# Patient Record
Sex: Female | Born: 1935 | Race: White | Hispanic: No | Smoking: Former smoker
Health system: Southern US, Community
[De-identification: ages and names within clinical notes are randomized; demographics above are authoritative.]

## PROBLEM LIST (undated history)

## (undated) DIAGNOSIS — I10 Essential (primary) hypertension: Secondary | ICD-10-CM

## (undated) DIAGNOSIS — I739 Peripheral vascular disease, unspecified: Secondary | ICD-10-CM

## (undated) DIAGNOSIS — I639 Cerebral infarction, unspecified: Secondary | ICD-10-CM

## (undated) DIAGNOSIS — N189 Chronic kidney disease, unspecified: Secondary | ICD-10-CM

## (undated) DIAGNOSIS — D509 Iron deficiency anemia, unspecified: Principal | ICD-10-CM

## (undated) DIAGNOSIS — D649 Anemia, unspecified: Secondary | ICD-10-CM

## (undated) DIAGNOSIS — L089 Local infection of the skin and subcutaneous tissue, unspecified: Secondary | ICD-10-CM

## (undated) DIAGNOSIS — L0390 Cellulitis, unspecified: Secondary | ICD-10-CM

## (undated) DIAGNOSIS — F32A Depression, unspecified: Secondary | ICD-10-CM

## (undated) DIAGNOSIS — IMO0001 Reserved for inherently not codable concepts without codable children: Secondary | ICD-10-CM

## (undated) DIAGNOSIS — I2510 Atherosclerotic heart disease of native coronary artery without angina pectoris: Secondary | ICD-10-CM

## (undated) DIAGNOSIS — K649 Unspecified hemorrhoids: Secondary | ICD-10-CM

## (undated) DIAGNOSIS — C801 Malignant (primary) neoplasm, unspecified: Secondary | ICD-10-CM

## (undated) DIAGNOSIS — R040 Epistaxis: Secondary | ICD-10-CM

## (undated) DIAGNOSIS — I452 Bifascicular block: Secondary | ICD-10-CM

## (undated) DIAGNOSIS — E11621 Type 2 diabetes mellitus with foot ulcer: Secondary | ICD-10-CM

## (undated) DIAGNOSIS — R600 Localized edema: Secondary | ICD-10-CM

## (undated) DIAGNOSIS — T148XXA Other injury of unspecified body region, initial encounter: Secondary | ICD-10-CM

## (undated) DIAGNOSIS — L97509 Non-pressure chronic ulcer of other part of unspecified foot with unspecified severity: Secondary | ICD-10-CM

## (undated) DIAGNOSIS — E039 Hypothyroidism, unspecified: Secondary | ICD-10-CM

## (undated) DIAGNOSIS — F329 Major depressive disorder, single episode, unspecified: Secondary | ICD-10-CM

## (undated) HISTORY — DX: Anemia, unspecified: D64.9

## (undated) HISTORY — PX: TONSILECTOMY, ADENOIDECTOMY, BILATERAL MYRINGOTOMY AND TUBES: SHX2538

## (undated) HISTORY — DX: Iron deficiency anemia, unspecified: D50.9

## (undated) HISTORY — DX: Essential (primary) hypertension: I10

## (undated) HISTORY — DX: Cerebral infarction, unspecified: I63.9

## (undated) HISTORY — DX: Cellulitis, unspecified: L03.90

## (undated) HISTORY — DX: Peripheral vascular disease, unspecified: I73.9

## (undated) HISTORY — PX: APPENDECTOMY: SHX54

## (undated) HISTORY — PX: CHOLECYSTECTOMY: SHX55

## (undated) HISTORY — DX: Malignant (primary) neoplasm, unspecified: C80.1

## (undated) HISTORY — DX: Atherosclerotic heart disease of native coronary artery without angina pectoris: I25.10

## (undated) HISTORY — PX: COLON SURGERY: SHX602

---

## 1998-11-25 ENCOUNTER — Other Ambulatory Visit: Admission: RE | Admit: 1998-11-25 | Discharge: 1998-11-25 | Payer: Self-pay | Admitting: Obstetrics and Gynecology

## 1999-10-27 ENCOUNTER — Encounter: Payer: Self-pay | Admitting: Rheumatology

## 1999-10-27 ENCOUNTER — Encounter: Admission: RE | Admit: 1999-10-27 | Discharge: 1999-10-27 | Payer: Self-pay | Admitting: *Deleted

## 1999-12-18 ENCOUNTER — Other Ambulatory Visit: Admission: RE | Admit: 1999-12-18 | Discharge: 1999-12-18 | Payer: Self-pay | Admitting: Obstetrics and Gynecology

## 2006-04-19 ENCOUNTER — Ambulatory Visit: Payer: Self-pay | Admitting: Hematology and Oncology

## 2006-04-23 LAB — CBC WITH DIFFERENTIAL/PLATELET
Basophils Absolute: 0 10*3/uL (ref 0.0–0.1)
EOS%: 0.1 % (ref 0.0–7.0)
HCT: 27.9 % — ABNORMAL LOW (ref 34.8–46.6)
HGB: 9.7 g/dL — ABNORMAL LOW (ref 11.6–15.9)
MONO#: 0.8 10*3/uL (ref 0.1–0.9)
NEUT#: 9.3 10*3/uL — ABNORMAL HIGH (ref 1.5–6.5)
NEUT%: 81.1 % — ABNORMAL HIGH (ref 39.6–76.8)
RDW: 14.2 % (ref 11.3–14.5)
WBC: 11.5 10*3/uL — ABNORMAL HIGH (ref 3.9–10.0)
lymph#: 1.3 10*3/uL (ref 0.9–3.3)

## 2006-04-23 LAB — COMPREHENSIVE METABOLIC PANEL
ALT: 14 U/L (ref 0–35)
AST: 9 U/L (ref 0–37)
Albumin: 3.4 g/dL — ABNORMAL LOW (ref 3.5–5.2)
BUN: 42 mg/dL — ABNORMAL HIGH (ref 6–23)
CO2: 22 mEq/L (ref 19–32)
Calcium: 9.1 mg/dL (ref 8.4–10.5)
Chloride: 105 mEq/L (ref 96–112)
Creatinine, Ser: 1.12 mg/dL (ref 0.40–1.20)
Potassium: 4.8 mEq/L (ref 3.5–5.3)

## 2006-04-23 LAB — CEA: CEA: 2.6 ng/mL (ref 0.0–5.0)

## 2006-07-03 ENCOUNTER — Ambulatory Visit: Payer: Self-pay | Admitting: Hematology and Oncology

## 2006-07-08 LAB — CBC WITH DIFFERENTIAL/PLATELET
BASO%: 0.1 % (ref 0.0–2.0)
EOS%: 2.3 % (ref 0.0–7.0)
HGB: 11 g/dL — ABNORMAL LOW (ref 11.6–15.9)
MCH: 30.3 pg (ref 26.0–34.0)
MCV: 86.9 fL (ref 81.0–101.0)
MONO%: 3.9 % (ref 0.0–13.0)
NEUT#: 10.7 10*3/uL — ABNORMAL HIGH (ref 1.5–6.5)
RBC: 3.64 10*6/uL — ABNORMAL LOW (ref 3.70–5.32)
RDW: 13.8 % (ref 11.3–14.5)
lymph#: 1.1 10*3/uL (ref 0.9–3.3)

## 2006-07-08 LAB — COMPREHENSIVE METABOLIC PANEL
ALT: 21 U/L (ref 0–35)
AST: 17 U/L (ref 0–37)
Albumin: 3.8 g/dL (ref 3.5–5.2)
Alkaline Phosphatase: 80 U/L (ref 39–117)
Calcium: 8.7 mg/dL (ref 8.4–10.5)
Chloride: 106 mEq/L (ref 96–112)
Potassium: 4.6 mEq/L (ref 3.5–5.3)
Sodium: 141 mEq/L (ref 135–145)
Total Protein: 6.2 g/dL (ref 6.0–8.3)

## 2006-08-01 LAB — CBC WITH DIFFERENTIAL/PLATELET
Basophils Absolute: 0 10*3/uL (ref 0.0–0.1)
Eosinophils Absolute: 0.2 10*3/uL (ref 0.0–0.5)
HGB: 10.4 g/dL — ABNORMAL LOW (ref 11.6–15.9)
MONO#: 0.5 10*3/uL (ref 0.1–0.9)
NEUT#: 6.1 10*3/uL (ref 1.5–6.5)
RBC: 3.51 10*6/uL — ABNORMAL LOW (ref 3.70–5.32)
RDW: 15.6 % — ABNORMAL HIGH (ref 11.3–14.5)
WBC: 8.5 10*3/uL (ref 3.9–10.0)
lymph#: 1.5 10*3/uL (ref 0.9–3.3)

## 2006-08-01 LAB — COMPREHENSIVE METABOLIC PANEL
Albumin: 3.7 g/dL (ref 3.5–5.2)
Alkaline Phosphatase: 87 U/L (ref 39–117)
BUN: 30 mg/dL — ABNORMAL HIGH (ref 6–23)
CO2: 24 mEq/L (ref 19–32)
Calcium: 9 mg/dL (ref 8.4–10.5)
Chloride: 103 mEq/L (ref 96–112)
Glucose, Bld: 127 mg/dL — ABNORMAL HIGH (ref 70–99)
Potassium: 4.7 mEq/L (ref 3.5–5.3)
Sodium: 139 mEq/L (ref 135–145)
Total Protein: 6.1 g/dL (ref 6.0–8.3)

## 2006-08-01 LAB — FERRITIN: Ferritin: 28 ng/mL (ref 10–291)

## 2006-08-14 ENCOUNTER — Ambulatory Visit: Payer: Self-pay | Admitting: Hematology and Oncology

## 2006-08-16 LAB — CBC WITH DIFFERENTIAL/PLATELET
Basophils Absolute: 0.1 10*3/uL (ref 0.0–0.1)
EOS%: 4.1 % (ref 0.0–7.0)
Eosinophils Absolute: 0.4 10*3/uL (ref 0.0–0.5)
MCHC: 34.6 g/dL (ref 32.0–36.0)
MONO#: 0.5 10*3/uL (ref 0.1–0.9)
NEUT%: 67.5 % (ref 39.6–76.8)
Platelets: 277 10*3/uL (ref 145–400)
RBC: 3.51 10*6/uL — ABNORMAL LOW (ref 3.70–5.32)
RDW: 16.6 % — ABNORMAL HIGH (ref 11.3–14.5)
WBC: 8.9 10*3/uL (ref 3.9–10.0)

## 2006-08-16 LAB — COMPREHENSIVE METABOLIC PANEL
AST: 19 U/L (ref 0–37)
Albumin: 3.8 g/dL (ref 3.5–5.2)
BUN: 32 mg/dL — ABNORMAL HIGH (ref 6–23)
Calcium: 9.2 mg/dL (ref 8.4–10.5)
Chloride: 105 mEq/L (ref 96–112)
Creatinine, Ser: 1.07 mg/dL (ref 0.40–1.20)
Glucose, Bld: 166 mg/dL — ABNORMAL HIGH (ref 70–99)

## 2006-08-16 LAB — FERRITIN: Ferritin: 51 ng/mL (ref 10–291)

## 2006-09-12 LAB — COMPREHENSIVE METABOLIC PANEL
Albumin: 3.6 g/dL (ref 3.5–5.2)
Alkaline Phosphatase: 98 U/L (ref 39–117)
CO2: 23 mEq/L (ref 19–32)
Glucose, Bld: 203 mg/dL — ABNORMAL HIGH (ref 70–99)
Potassium: 4.8 mEq/L (ref 3.5–5.3)
Sodium: 138 mEq/L (ref 135–145)
Total Protein: 6.1 g/dL (ref 6.0–8.3)

## 2006-09-12 LAB — CBC WITH DIFFERENTIAL/PLATELET
Eosinophils Absolute: 0.3 10*3/uL (ref 0.0–0.5)
MONO#: 0.5 10*3/uL (ref 0.1–0.9)
MONO%: 6.3 % (ref 0.0–13.0)
NEUT#: 5.9 10*3/uL (ref 1.5–6.5)
RBC: 3.14 10*6/uL — ABNORMAL LOW (ref 3.70–5.32)
RDW: 20.7 % — ABNORMAL HIGH (ref 11.3–14.5)
WBC: 8.6 10*3/uL (ref 3.9–10.0)

## 2006-10-02 ENCOUNTER — Ambulatory Visit (HOSPITAL_COMMUNITY): Admission: RE | Admit: 2006-10-02 | Discharge: 2006-10-02 | Payer: Self-pay | Admitting: Hematology & Oncology

## 2006-10-17 ENCOUNTER — Ambulatory Visit: Payer: Self-pay | Admitting: Hematology and Oncology

## 2006-10-17 LAB — COMPREHENSIVE METABOLIC PANEL
ALT: 14 U/L (ref 0–35)
AST: 13 U/L (ref 0–37)
Albumin: 3.8 g/dL (ref 3.5–5.2)
Alkaline Phosphatase: 101 U/L (ref 39–117)
Potassium: 5.3 mEq/L (ref 3.5–5.3)
Sodium: 138 mEq/L (ref 135–145)
Total Protein: 6.2 g/dL (ref 6.0–8.3)

## 2006-10-17 LAB — CBC WITH DIFFERENTIAL/PLATELET
BASO%: 0.3 % (ref 0.0–2.0)
EOS%: 4 % (ref 0.0–7.0)
Eosinophils Absolute: 0.3 10*3/uL (ref 0.0–0.5)
MCHC: 35 g/dL (ref 32.0–36.0)
MCV: 94.7 fL (ref 81.0–101.0)
MONO%: 6.9 % (ref 0.0–13.0)
NEUT#: 5.3 10*3/uL (ref 1.5–6.5)
RBC: 3.09 10*6/uL — ABNORMAL LOW (ref 3.70–5.32)
RDW: 18.9 % — ABNORMAL HIGH (ref 11.3–14.5)

## 2006-11-07 LAB — CBC WITH DIFFERENTIAL/PLATELET
BASO%: 0.6 % (ref 0.0–2.0)
EOS%: 3.3 % (ref 0.0–7.0)
MCH: 33.3 pg (ref 26.0–34.0)
MCHC: 34.4 g/dL (ref 32.0–36.0)
MONO#: 0.5 10*3/uL (ref 0.1–0.9)
RBC: 3.11 10*6/uL — ABNORMAL LOW (ref 3.70–5.32)
RDW: 17.4 % — ABNORMAL HIGH (ref 11.3–14.5)
WBC: 8 10*3/uL (ref 3.9–10.0)
lymph#: 1.7 10*3/uL (ref 0.9–3.3)

## 2006-11-07 LAB — COMPREHENSIVE METABOLIC PANEL
ALT: 14 U/L (ref 0–35)
AST: 12 U/L (ref 0–37)
CO2: 21 mEq/L (ref 19–32)
Calcium: 9.1 mg/dL (ref 8.4–10.5)
Chloride: 105 mEq/L (ref 96–112)
Creatinine, Ser: 1.18 mg/dL (ref 0.40–1.20)
Sodium: 140 mEq/L (ref 135–145)
Total Protein: 6.1 g/dL (ref 6.0–8.3)

## 2006-12-03 ENCOUNTER — Ambulatory Visit: Payer: Self-pay | Admitting: Hematology and Oncology

## 2006-12-05 LAB — CBC WITH DIFFERENTIAL/PLATELET
BASO%: 0.5 % (ref 0.0–2.0)
EOS%: 2.8 % (ref 0.0–7.0)
HCT: 29.2 % — ABNORMAL LOW (ref 34.8–46.6)
LYMPH%: 17.3 % (ref 14.0–48.0)
MCH: 34.4 pg — ABNORMAL HIGH (ref 26.0–34.0)
MCHC: 34.9 g/dL (ref 32.0–36.0)
MONO#: 0.6 10*3/uL (ref 0.1–0.9)
NEUT%: 73 % (ref 39.6–76.8)
Platelets: 296 10*3/uL (ref 145–400)
RBC: 2.97 10*6/uL — ABNORMAL LOW (ref 3.70–5.32)
WBC: 9.4 10*3/uL (ref 3.9–10.0)
lymph#: 1.6 10*3/uL (ref 0.9–3.3)

## 2006-12-05 LAB — COMPREHENSIVE METABOLIC PANEL
ALT: 13 U/L (ref 0–35)
AST: 12 U/L (ref 0–37)
Alkaline Phosphatase: 102 U/L (ref 39–117)
CO2: 23 mEq/L (ref 19–32)
Creatinine, Ser: 1.29 mg/dL — ABNORMAL HIGH (ref 0.40–1.20)
Sodium: 138 mEq/L (ref 135–145)
Total Bilirubin: 0.3 mg/dL (ref 0.3–1.2)
Total Protein: 6.4 g/dL (ref 6.0–8.3)

## 2006-12-26 LAB — CBC WITH DIFFERENTIAL/PLATELET
BASO%: 0.3 % (ref 0.0–2.0)
EOS%: 3.1 % (ref 0.0–7.0)
HCT: 29.6 % — ABNORMAL LOW (ref 34.8–46.6)
LYMPH%: 16.4 % (ref 14.0–48.0)
MCH: 33.8 pg (ref 26.0–34.0)
MCHC: 34.4 g/dL (ref 32.0–36.0)
MCV: 98.4 fL (ref 81.0–101.0)
MONO#: 0.7 10*3/uL (ref 0.1–0.9)
MONO%: 8.1 % (ref 0.0–13.0)
NEUT%: 72.1 % (ref 39.6–76.8)
Platelets: 280 10*3/uL (ref 145–400)
RBC: 3.01 10*6/uL — ABNORMAL LOW (ref 3.70–5.32)

## 2007-01-09 ENCOUNTER — Ambulatory Visit (HOSPITAL_COMMUNITY): Admission: RE | Admit: 2007-01-09 | Discharge: 2007-01-09 | Payer: Self-pay | Admitting: Hematology and Oncology

## 2007-01-09 LAB — CBC WITH DIFFERENTIAL/PLATELET
Basophils Absolute: 0 10*3/uL (ref 0.0–0.1)
Eosinophils Absolute: 0.3 10*3/uL (ref 0.0–0.5)
HCT: 29.4 % — ABNORMAL LOW (ref 34.8–46.6)
HGB: 10.2 g/dL — ABNORMAL LOW (ref 11.6–15.9)
MCH: 33.5 pg (ref 26.0–34.0)
NEUT#: 6 10*3/uL (ref 1.5–6.5)
NEUT%: 71.5 % (ref 39.6–76.8)
RDW: 15.3 % — ABNORMAL HIGH (ref 11.3–14.5)
lymph#: 1.5 10*3/uL (ref 0.9–3.3)

## 2007-01-09 LAB — COMPREHENSIVE METABOLIC PANEL
Albumin: 3.7 g/dL (ref 3.5–5.2)
BUN: 32 mg/dL — ABNORMAL HIGH (ref 6–23)
CO2: 26 mEq/L (ref 19–32)
Calcium: 8.7 mg/dL (ref 8.4–10.5)
Chloride: 99 mEq/L (ref 96–112)
Creatinine, Ser: 1.17 mg/dL (ref 0.40–1.20)
Glucose, Bld: 81 mg/dL (ref 70–99)
Potassium: 4.8 mEq/L (ref 3.5–5.3)

## 2007-01-09 LAB — CEA: CEA: 2.1 ng/mL (ref 0.0–5.0)

## 2007-01-09 LAB — LACTATE DEHYDROGENASE: LDH: 145 U/L (ref 94–250)

## 2007-01-15 ENCOUNTER — Ambulatory Visit: Payer: Self-pay | Admitting: Hematology and Oncology

## 2007-01-17 LAB — VITAMIN B12: Vitamin B-12: 538 pg/mL (ref 211–911)

## 2007-01-17 LAB — FERRITIN: Ferritin: 41 ng/mL (ref 10–291)

## 2007-01-17 LAB — IRON AND TIBC
Iron: 47 ug/dL (ref 42–145)
UIBC: 258 ug/dL

## 2007-04-25 ENCOUNTER — Ambulatory Visit: Payer: Self-pay | Admitting: Hematology and Oncology

## 2007-04-29 LAB — CBC WITH DIFFERENTIAL/PLATELET
EOS%: 3.7 % (ref 0.0–7.0)
Eosinophils Absolute: 0.3 10*3/uL (ref 0.0–0.5)
HGB: 11.3 g/dL — ABNORMAL LOW (ref 11.6–15.9)
MCH: 27.9 pg (ref 26.0–34.0)
MCV: 86.7 fL (ref 81.0–101.0)
MONO%: 4.8 % (ref 0.0–13.0)
NEUT#: 6.7 10*3/uL — ABNORMAL HIGH (ref 1.5–6.5)
RBC: 4.04 10*6/uL (ref 3.70–5.32)
RDW: 13.2 % (ref 11.3–14.5)
lymph#: 1.5 10*3/uL (ref 0.9–3.3)

## 2007-04-30 LAB — COMPREHENSIVE METABOLIC PANEL
AST: 9 U/L (ref 0–37)
Albumin: 4 g/dL (ref 3.5–5.2)
Alkaline Phosphatase: 79 U/L (ref 39–117)
Calcium: 9.6 mg/dL (ref 8.4–10.5)
Chloride: 101 mEq/L (ref 96–112)
Potassium: 5 mEq/L (ref 3.5–5.3)
Sodium: 139 mEq/L (ref 135–145)
Total Protein: 6.8 g/dL (ref 6.0–8.3)

## 2007-04-30 LAB — CEA: CEA: 1.4 ng/mL (ref 0.0–5.0)

## 2007-07-25 ENCOUNTER — Ambulatory Visit: Payer: Self-pay | Admitting: Hematology and Oncology

## 2007-07-31 ENCOUNTER — Ambulatory Visit (HOSPITAL_COMMUNITY): Admission: RE | Admit: 2007-07-31 | Discharge: 2007-07-31 | Payer: Self-pay | Admitting: Hematology and Oncology

## 2007-12-02 ENCOUNTER — Ambulatory Visit: Payer: Self-pay | Admitting: Hematology and Oncology

## 2007-12-04 LAB — CBC WITH DIFFERENTIAL/PLATELET
Basophils Absolute: 0 10*3/uL (ref 0.0–0.1)
EOS%: 2.9 % (ref 0.0–7.0)
Eosinophils Absolute: 0.3 10*3/uL (ref 0.0–0.5)
HCT: 32.2 % — ABNORMAL LOW (ref 34.8–46.6)
HGB: 10.7 g/dL — ABNORMAL LOW (ref 11.6–15.9)
MONO#: 0.5 10*3/uL (ref 0.1–0.9)
NEUT#: 7 10*3/uL — ABNORMAL HIGH (ref 1.5–6.5)
NEUT%: 76.4 % (ref 39.6–76.8)
RDW: 13.5 % (ref 11.3–14.5)
WBC: 9.2 10*3/uL (ref 3.9–10.0)
lymph#: 1.4 10*3/uL (ref 0.9–3.3)

## 2007-12-04 LAB — COMPREHENSIVE METABOLIC PANEL
AST: 11 U/L (ref 0–37)
Albumin: 3.7 g/dL (ref 3.5–5.2)
BUN: 22 mg/dL (ref 6–23)
CO2: 25 mEq/L (ref 19–32)
Calcium: 8.7 mg/dL (ref 8.4–10.5)
Chloride: 103 mEq/L (ref 96–112)
Creatinine, Ser: 1.18 mg/dL (ref 0.40–1.20)
Potassium: 5 mEq/L (ref 3.5–5.3)

## 2007-12-04 LAB — CEA: CEA: 2 ng/mL (ref 0.0–5.0)

## 2008-03-30 ENCOUNTER — Ambulatory Visit: Payer: Self-pay | Admitting: Hematology and Oncology

## 2008-04-06 ENCOUNTER — Ambulatory Visit (HOSPITAL_COMMUNITY): Admission: RE | Admit: 2008-04-06 | Discharge: 2008-04-06 | Payer: Self-pay | Admitting: Hematology and Oncology

## 2008-08-17 ENCOUNTER — Ambulatory Visit: Payer: Self-pay | Admitting: Hematology and Oncology

## 2008-08-19 LAB — CBC WITH DIFFERENTIAL/PLATELET
BASO%: 0.4 % (ref 0.0–2.0)
Basophils Absolute: 0 10*3/uL (ref 0.0–0.1)
HCT: 31.7 % — ABNORMAL LOW (ref 34.8–46.6)
MCV: 86.1 fL (ref 79.5–101.0)
MONO%: 5.6 % (ref 0.0–14.0)
NEUT#: 7.2 10*3/uL — ABNORMAL HIGH (ref 1.5–6.5)
RBC: 3.69 10*6/uL — ABNORMAL LOW (ref 3.70–5.45)
WBC: 9.7 10*3/uL (ref 3.9–10.3)

## 2008-08-19 LAB — COMPREHENSIVE METABOLIC PANEL
ALT: 9 U/L (ref 0–35)
CO2: 26 mEq/L (ref 19–32)
Calcium: 9.6 mg/dL (ref 8.4–10.5)
Chloride: 102 mEq/L (ref 96–112)
Glucose, Bld: 184 mg/dL — ABNORMAL HIGH (ref 70–99)
Potassium: 5.1 mEq/L (ref 3.5–5.3)
Sodium: 140 mEq/L (ref 135–145)
Total Bilirubin: 0.3 mg/dL (ref 0.3–1.2)

## 2008-08-19 LAB — CEA: CEA: 2.2 ng/mL (ref 0.0–5.0)

## 2008-12-24 ENCOUNTER — Ambulatory Visit: Payer: Self-pay | Admitting: Hematology and Oncology

## 2008-12-28 ENCOUNTER — Ambulatory Visit: Payer: Self-pay | Admitting: Hematology & Oncology

## 2008-12-28 ENCOUNTER — Ambulatory Visit (HOSPITAL_COMMUNITY): Admission: RE | Admit: 2008-12-28 | Discharge: 2008-12-28 | Payer: Self-pay | Admitting: Hematology and Oncology

## 2008-12-28 LAB — CBC WITH DIFFERENTIAL/PLATELET
Basophils Absolute: 0.1 10*3/uL (ref 0.0–0.1)
EOS%: 4 % (ref 0.0–7.0)
LYMPH%: 22.1 % (ref 14.0–49.7)
MCH: 29 pg (ref 25.1–34.0)
MONO%: 5.9 % (ref 0.0–14.0)
NEUT#: 6.2 10*3/uL (ref 1.5–6.5)
NEUT%: 67.4 % (ref 38.4–76.8)
RDW: 14 % (ref 11.2–14.5)

## 2008-12-28 LAB — COMPREHENSIVE METABOLIC PANEL
Alkaline Phosphatase: 66 U/L (ref 39–117)
Calcium: 9.2 mg/dL (ref 8.4–10.5)
Chloride: 102 mEq/L (ref 96–112)
Glucose, Bld: 147 mg/dL — ABNORMAL HIGH (ref 70–99)
Potassium: 4.7 mEq/L (ref 3.5–5.3)
Sodium: 138 mEq/L (ref 135–145)
Total Bilirubin: 0.5 mg/dL (ref 0.3–1.2)

## 2008-12-28 LAB — CEA: CEA: 2.2 ng/mL (ref 0.0–5.0)

## 2009-06-24 ENCOUNTER — Ambulatory Visit: Payer: Self-pay | Admitting: Hematology and Oncology

## 2009-06-28 LAB — COMPREHENSIVE METABOLIC PANEL
ALT: 17 U/L (ref 0–35)
Alkaline Phosphatase: 65 U/L (ref 39–117)
BUN: 39 mg/dL — ABNORMAL HIGH (ref 6–23)
CO2: 29 mEq/L (ref 19–32)
Glucose, Bld: 147 mg/dL — ABNORMAL HIGH (ref 70–99)
Total Bilirubin: 0.5 mg/dL (ref 0.3–1.2)
Total Protein: 6.6 g/dL (ref 6.0–8.3)

## 2009-06-28 LAB — CBC WITH DIFFERENTIAL/PLATELET
EOS%: 3.5 % (ref 0.0–7.0)
Eosinophils Absolute: 0.3 10*3/uL (ref 0.0–0.5)
HGB: 10.7 g/dL — ABNORMAL LOW (ref 11.6–15.9)
MCHC: 32.4 g/dL (ref 31.5–36.0)
MONO%: 5.5 % (ref 0.0–14.0)
NEUT%: 70.7 % (ref 38.4–76.8)
Platelets: 272 10*3/uL (ref 145–400)
RBC: 3.73 10*6/uL (ref 3.70–5.45)

## 2009-06-29 LAB — CEA: CEA: 2.5 ng/mL (ref 0.0–5.0)

## 2009-12-07 ENCOUNTER — Ambulatory Visit: Payer: Self-pay | Admitting: Vascular Surgery

## 2009-12-09 ENCOUNTER — Ambulatory Visit (HOSPITAL_COMMUNITY): Admission: RE | Admit: 2009-12-09 | Discharge: 2009-12-09 | Payer: Self-pay | Admitting: Cardiology

## 2009-12-21 ENCOUNTER — Ambulatory Visit (HOSPITAL_BASED_OUTPATIENT_CLINIC_OR_DEPARTMENT_OTHER): Payer: Medicare Other | Admitting: Hematology & Oncology

## 2010-03-31 ENCOUNTER — Other Ambulatory Visit: Payer: Self-pay | Admitting: Hematology & Oncology

## 2010-03-31 DIAGNOSIS — C189 Malignant neoplasm of colon, unspecified: Secondary | ICD-10-CM

## 2010-04-01 ENCOUNTER — Encounter: Payer: Self-pay | Admitting: Hematology & Oncology

## 2010-06-15 ENCOUNTER — Ambulatory Visit (HOSPITAL_BASED_OUTPATIENT_CLINIC_OR_DEPARTMENT_OTHER)
Admission: RE | Admit: 2010-06-15 | Discharge: 2010-06-15 | Disposition: A | Discharge status: Home / Self Care | Payer: Medicare Other | Source: Ambulatory Visit | Attending: Hematology & Oncology | Admitting: Hematology & Oncology

## 2010-06-15 ENCOUNTER — Encounter (HOSPITAL_BASED_OUTPATIENT_CLINIC_OR_DEPARTMENT_OTHER): Payer: Medicare Other | Admitting: Hematology & Oncology

## 2010-06-15 ENCOUNTER — Other Ambulatory Visit (HOSPITAL_BASED_OUTPATIENT_CLINIC_OR_DEPARTMENT_OTHER): Payer: Self-pay

## 2010-06-15 DIAGNOSIS — K5730 Diverticulosis of large intestine without perforation or abscess without bleeding: Secondary | ICD-10-CM | POA: Insufficient documentation

## 2010-06-15 DIAGNOSIS — C189 Malignant neoplasm of colon, unspecified: Secondary | ICD-10-CM

## 2010-06-15 DIAGNOSIS — J984 Other disorders of lung: Secondary | ICD-10-CM | POA: Insufficient documentation

## 2010-06-15 DIAGNOSIS — Z09 Encounter for follow-up examination after completed treatment for conditions other than malignant neoplasm: Secondary | ICD-10-CM | POA: Insufficient documentation

## 2010-06-15 DIAGNOSIS — C184 Malignant neoplasm of transverse colon: Secondary | ICD-10-CM

## 2010-06-15 LAB — CMP (CANCER CENTER ONLY)
ALT(SGPT): 18 U/L (ref 10–47)
AST: 17 U/L (ref 11–38)
Albumin: 3.5 g/dL (ref 3.3–5.5)
Calcium: 8.9 mg/dL (ref 8.0–10.3)
Glucose, Bld: 114 mg/dL (ref 73–118)
Total Bilirubin: 0.6 mg/dl (ref 0.20–1.60)

## 2010-06-15 LAB — CBC WITH DIFFERENTIAL (CANCER CENTER ONLY)
BASO#: 0.1 10*3/uL (ref 0.0–0.2)
BASO%: 0.7 % (ref 0.0–2.0)
Eosinophils Absolute: 0.4 10*3/uL (ref 0.0–0.5)
HGB: 9.5 g/dL — ABNORMAL LOW (ref 11.6–15.9)
LYMPH#: 1.5 10*3/uL (ref 0.9–3.3)
MCHC: 31.8 g/dL — ABNORMAL LOW (ref 32.0–36.0)
MCV: 82 fL (ref 81–101)
NEUT#: 6.1 10*3/uL (ref 1.5–6.5)
Platelets: 308 10*3/uL (ref 145–400)
WBC: 8.6 10*3/uL (ref 3.9–10.0)

## 2010-06-15 LAB — CEA: CEA: 2.9 ng/mL (ref 0.0–5.0)

## 2010-06-15 MED ORDER — IOHEXOL 300 MG/ML  SOLN
100.0000 mL | Freq: Once | INTRAMUSCULAR | Status: AC | PRN
  Administered 2010-06-15: 100 mL via INTRAVENOUS

## 2010-06-29 ENCOUNTER — Encounter (HOSPITAL_BASED_OUTPATIENT_CLINIC_OR_DEPARTMENT_OTHER): Payer: Medicare Other | Admitting: Hematology & Oncology

## 2010-06-29 DIAGNOSIS — C184 Malignant neoplasm of transverse colon: Secondary | ICD-10-CM

## 2010-07-25 NOTE — Consult Note (Signed)
NEW PATIENT CONSULTATION   Sandra [name], BRUTUS  DOB:  05/13/1930                                       12/07/2009  ZOXWR#:[id number]   I saw the patient in the office today in consultation concerning a right  carotid stenosis.  This is a pleasant 77 year old woman who was referred  by Dr. Anne Fu.  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  She has a persistent visual field cut on the right.  Of note, she is  right-handed.  She has had no previous history of stroke, TIAs,  expressive or receptive aphasia or amaurosis fugax.  Her workup included  a carotid duplex scan which was done at [facility] and showed  evidence of a 50%-69% right carotid stenosis and a < 40% left internal  carotid artery stenosis.  In addition, she underwent a transthoracic  echo on 11/17/2009 which showed normal LV size and function.  There are  no significant regional wall motion abnormalities.  Ejection fraction  was estimated at 55%-60%.  There was some thickening of the mitral valve  with moderate mitral annular calcification and some redundant tissue  involving the posterior leaflet.  She had mild mitral valve regurg.  She  is scheduled for a transesophageal echo on Friday of this week.   Her past medical history is significant for adult onset diabetes.  She  does require insulin.  In addition, she has significant obesity.  She  has hypertension and has undergone previous colon resection for a colon  cancer in 2007.  This was done in [place].  She is also status post  previous cholecystectomy and appendectomy.  She denies any previous  history of myocardial infarction, history of congestive heart failure or  history of COPD.   SOCIAL HISTORY:  She is single.  She has four children.  She quit  tobacco approximately 1970.   FAMILY HISTORY:  She is unaware of any history of premature  cardiovascular disease.   REVIEW OF SYSTEMS:  GENERAL:  She has had no recent weight  loss, weight  gain, problems with her appetite.  She is 5 feet 1 inches tall, 238  pounds.  CARDIOVASCULAR:  She does have occasional chest pain which has been  stable.  She admits to dyspnea on exertion.  Her activity is fairly  limited.  She has had no palpitations or history of arrhythmias.  She  has had a history of superficial thrombophlebitis in the '80s.  She has  had no history of DVT that she is aware of.  GI:  She has a history of hiatal hernia and occasional problems  swallowing.  NEUROLOGIC:  She has occasional dizziness.  PULMONARY:  She has had bronchitis and wheezing in the past.  HEMATOLOGIC:  She has had anemia in the past.  GU:  She does have urinary frequency.  ENT:  There has been no change in her hearing.  She has had the visual  field cut on the right as described above.  MUSCULOSKELETAL:  She does have a history of arthritis and muscle  cramps.  PSYCHIATRIC:  Unremarkable.  INTEGUMENTARY:  Unremarkable.   PHYSICAL EXAMINATION:  General:  This is a pleasant 77 year old woman  who appears her stated age.  She is morbidly obese.  Vital signs:  Blood  pressure is 130/72 in the  right arm, 124/76 in the left arm, heart rate  is 76, respiratory rate is 16.  HEENT:  Shows a visual field cut on the  superior aspect of her right visual field.  Ears, nose and throat  unremarkable.  Lungs:  Are clear bilaterally to auscultation without  rales, rhonchi or wheezing.  Cardiovascular:  I do not detect any  carotid bruits.  She has a regular rate and rhythm.  Because of her size  I cannot palpate femoral pulses.  Both feet appear warm and well-  perfused without ischemic ulcers.  She has bilateral lower extremity  edema.  Abdomen:  Soft, nontender.  She has normal pitched bowel sounds.  Musculoskeletal:  There are no major deformities or cyanosis.  Neurological:  I do not detect any focal weakness or paresthesias.  Skin:  There are no ulcers or rashes.   We did repeat her  carotid duplex scan of the right carotid artery in our  office today which I independently interpreted.  She had a peak systolic  velocity of 180 cm/sec and an end-diastolic velocity of 67 cm/sec.  This  would suggest a 40%-59% stenosis on the right at the higher end of that  range.   Given the carotid stenosis on the right with a right retinal infarct I  have recommended right carotid endarterectomy in order to lower her risk  of future stroke.  We have discussed the indications for the procedure  and the potential complications including but not limited to bleeding,  stroke (peri procedural risk 1%-2%), nerve injury, MI, or other  unpredictable medical problems.  I do not think it is necessary to  perform a cerebral arteriography and this too would be associated with  1%-2% risk of stroke.  She does know to continue taking her aspirin.  She is scheduled for a TEE on Friday and once she is cleared from a  cardiac standpoint we can arrange for her right carotid endarterectomy.  All of her questions were answered and she will let us know when she  hears the results of her TEE.     Di Kindle. Edilia Bo, M.D.  Electronically Signed   CSD/MEDQ  D:  12/07/2009  T:  12/08/2009  Job:  [id number]   cc:   Jake Bathe, MD

## 2010-07-25 NOTE — Procedures (Signed)
CAROTID DUPLEX EXAM   INDICATION:  Right carotid artery disease.   HISTORY:  Diabetes:  No.  Cardiac:  No.  Hypertension:  Yes.  Smoking:  No.  Previous Surgery:  No.  CV History:  Possible TIA, right eye.  Amaurosis Fugax , Paresthesias , Hemiparesis                                       RIGHT             LEFT  Brachial systolic pressure:  Brachial Doppler waveforms:  Vertebral direction of flow:        Antegrade  DUPLEX VELOCITIES (cm/sec)  CCA peak systolic                   95  ECA peak systolic                   221  ICA peak systolic                   180  ICA end diastolic                   67  PLAQUE MORPHOLOGY:                  Heterogenous  PLAQUE AMOUNT:                      Minimal/moderate  PLAQUE LOCATION:                    ICA   IMPRESSION:  1. Right internal carotid artery velocities suggestive of 40% to 59%      stenosis, high end of range.  2. Right external carotid artery stenosis noted.    ___________________________________________  Di Kindle. Edilia Bo, M.D.   EM/MEDQ  D:  12/07/2009  T:  12/07/2009  Job:  [id number]

## 2010-09-28 ENCOUNTER — Encounter: Payer: Self-pay | Admitting: Podiatry

## 2010-09-28 DIAGNOSIS — E1129 Type 2 diabetes mellitus with other diabetic kidney complication: Secondary | ICD-10-CM | POA: Insufficient documentation

## 2010-09-28 DIAGNOSIS — M1990 Unspecified osteoarthritis, unspecified site: Secondary | ICD-10-CM

## 2010-12-21 ENCOUNTER — Other Ambulatory Visit: Payer: Self-pay | Admitting: Hematology & Oncology

## 2010-12-21 ENCOUNTER — Encounter (HOSPITAL_BASED_OUTPATIENT_CLINIC_OR_DEPARTMENT_OTHER): Payer: Medicare Other | Admitting: Hematology & Oncology

## 2010-12-21 DIAGNOSIS — C184 Malignant neoplasm of transverse colon: Secondary | ICD-10-CM

## 2010-12-21 LAB — CBC WITH DIFFERENTIAL (CANCER CENTER ONLY)
BASO%: 0.4 % (ref 0.0–2.0)
EOS%: 3 % (ref 0.0–7.0)
HCT: 31.1 % — ABNORMAL LOW (ref 34.8–46.6)
LYMPH%: 16.9 % (ref 14.0–48.0)
MCHC: 32.2 g/dL (ref 32.0–36.0)
MCV: 84 fL (ref 81–101)
MONO%: 6.2 % (ref 0.0–13.0)
NEUT%: 73.5 % (ref 39.6–80.0)
Platelets: 279 10*3/uL (ref 145–400)
RDW: 14.9 % (ref 11.1–15.7)

## 2010-12-22 LAB — COMPREHENSIVE METABOLIC PANEL
ALT: 12 U/L (ref 0–35)
AST: 14 U/L (ref 0–37)
Alkaline Phosphatase: 70 U/L (ref 39–117)
CO2: 21 mEq/L (ref 19–32)
Creatinine, Ser: 1.21 mg/dL — ABNORMAL HIGH (ref 0.50–1.10)
Sodium: 140 mEq/L (ref 135–145)
Total Bilirubin: 0.3 mg/dL (ref 0.3–1.2)
Total Protein: 6.5 g/dL (ref 6.0–8.3)

## 2010-12-22 LAB — CEA: CEA: 2.4 ng/mL (ref 0.0–5.0)

## 2011-06-21 ENCOUNTER — Other Ambulatory Visit: Payer: Self-pay | Admitting: Hematology & Oncology

## 2011-06-21 ENCOUNTER — Other Ambulatory Visit: Payer: Medicare Other | Admitting: Lab

## 2011-06-21 ENCOUNTER — Ambulatory Visit (HOSPITAL_BASED_OUTPATIENT_CLINIC_OR_DEPARTMENT_OTHER): Payer: Medicare Other | Admitting: Hematology & Oncology

## 2011-06-21 VITALS — BP 142/58 | HR 77 | Temp 97.0°F | Ht 63.0 in | Wt 230.0 lb

## 2011-06-21 DIAGNOSIS — Z85038 Personal history of other malignant neoplasm of large intestine: Secondary | ICD-10-CM

## 2011-06-21 DIAGNOSIS — C189 Malignant neoplasm of colon, unspecified: Secondary | ICD-10-CM

## 2011-06-21 LAB — COMPREHENSIVE METABOLIC PANEL
AST: 16 U/L (ref 0–37)
BUN: 26 mg/dL — ABNORMAL HIGH (ref 6–23)
Calcium: 8.9 mg/dL (ref 8.4–10.5)
Chloride: 98 mEq/L (ref 96–112)
Creatinine, Ser: 1.12 mg/dL — ABNORMAL HIGH (ref 0.50–1.10)
Total Bilirubin: 0.4 mg/dL (ref 0.3–1.2)

## 2011-06-21 LAB — CBC WITH DIFFERENTIAL (CANCER CENTER ONLY)
BASO%: 0.4 % (ref 0.0–2.0)
EOS%: 4.6 % (ref 0.0–7.0)
HCT: 32.7 % — ABNORMAL LOW (ref 34.8–46.6)
LYMPH%: 17.9 % (ref 14.0–48.0)
MCH: 27 pg (ref 26.0–34.0)
MCHC: 32.1 g/dL (ref 32.0–36.0)
MCV: 84 fL (ref 81–101)
MONO#: 0.6 10*3/uL (ref 0.1–0.9)
MONO%: 6 % (ref 0.0–13.0)
NEUT%: 71.1 % (ref 39.6–80.0)
Platelets: 248 10*3/uL (ref 145–400)
RDW: 15.2 % (ref 11.1–15.7)

## 2011-06-21 LAB — LACTATE DEHYDROGENASE: LDH: 134 U/L (ref 94–250)

## 2011-06-21 NOTE — Progress Notes (Signed)
This office note has been dictated.

## 2011-06-22 NOTE — Progress Notes (Signed)
CC:   Thora Lance, M.D. Danise Edge, M.D.  DIAGNOSIS:  Stage IIIB (T3 N1 M0) adenocarcinoma of the colon.  CURRENT THERAPY:  Observation.  INTERIM HISTORY:  Sandra [name] comes in for her followup.  She is doing okay from a cancer point of view.  She really has not had any issues cancer-wise from my vantage point.  She has other issues that certainly are more pressing.  She has diabetes.  She has arthritic issues.  She may need to have some type of surgery for her knees.  Again, she has had no problems with respect to colon cancer recurrence. She was last treated back in July 2008.  She also got 8 cycles of Xeloda in the adjuvant setting.  Her surgery was actually up in [place].  She had, I think. 2 lymph nodes that were positive.  Again, she has multiple other issues that are more pressing for her.  Her last CEA level was 2.4 back in October.  Her last colonoscopy was, I think,  a year ago.  I do not think she needs to have another one probably for at least another 3-4 years.  She has had no cough.  She has had no bony pain.  She does have the arthritic issues with I think her right knee.  She does have occasional swelling in her legs.  She has had no rashes.  PHYSICAL EXAMINATION:  This is an obese white female in no obvious distress.  Vital signs:  Temperature of 97, pulse 77, respiratory rate 18, blood pressure 142/58.  Weight is 230.  Head and neck exam shows a normocephalic, atraumatic skull.  There are no ocular or oral lesions. There are no palpable cervical or supraclavicular lymph nodes.  Lungs: Clear to percussion and auscultation bilaterally.  Cardiac:  Regular rate and rhythm with a normal S1 and S2.  There are no murmurs, rubs or bruits.  Abdomen:  Obese abdomen that is soft.  She has no fluid wave. There is no guarding or rebound tenderness.  Laparotomy scar is well- healed.  There is no palpable hepatosplenomegaly.  Extremities:  Trace edema in her legs  bilaterally.  No palpable venous cords noted in her legs.  Back:  Exam shows no tenderness over the spine, ribs, or hips. Extremities:  No clubbing, cyanosis or edema.  Neurologic: No focal neurological deficits.  LABORATORY STUDIES:  White cell count is 9.4, hemoglobin 10.5, hematocrit 32.7, platelet count 248.  IMPRESSION:  Ms. Couzens is a 78 year old white female with history of stage IIIB colon cancer.  She is now I think almost 5 years out from completion of her adjuvant therapy.  Again, I do not see any evidence of recurrence.  We can get her back in 6 months.  I think this is reasonable for her.    ______________________________ Josph Macho, M.D. PRE/MEDQ  D:  06/21/2011  T:  06/22/2011  Job:  [id number]

## 2011-07-06 ENCOUNTER — Telehealth: Payer: Self-pay | Admitting: *Deleted

## 2011-07-06 NOTE — Telephone Encounter (Signed)
Message copied by Anselm Jungling on Fri Jul 06, 2011  2:38 PM ------      Message from: Arlan Organ R      Created: Thu Jun 28, 2011 10:56 AM       Call - labs are ok.  pete

## 2011-07-06 NOTE — Telephone Encounter (Signed)
Called patient to let her know that her labwork is ok per dr. Myna Hidalgo

## 2011-07-16 ENCOUNTER — Telehealth: Payer: Self-pay | Admitting: *Deleted

## 2011-08-09 NOTE — Telephone Encounter (Signed)
Opened in error

## 2011-11-16 ENCOUNTER — Other Ambulatory Visit: Payer: Self-pay | Admitting: *Deleted

## 2011-11-16 DIAGNOSIS — I6529 Occlusion and stenosis of unspecified carotid artery: Secondary | ICD-10-CM

## 2011-11-20 ENCOUNTER — Encounter: Payer: Self-pay | Admitting: Vascular Surgery

## 2011-11-21 ENCOUNTER — Ambulatory Visit (INDEPENDENT_AMBULATORY_CARE_PROVIDER_SITE_OTHER): Payer: Medicare Other | Admitting: Vascular Surgery

## 2011-11-21 ENCOUNTER — Encounter: Payer: Self-pay | Admitting: Vascular Surgery

## 2011-11-21 ENCOUNTER — Other Ambulatory Visit (INDEPENDENT_AMBULATORY_CARE_PROVIDER_SITE_OTHER): Payer: Medicare Other | Admitting: *Deleted

## 2011-11-21 VITALS — BP 168/84 | HR 61 | Resp 12 | Ht 63.0 in | Wt 223.0 lb

## 2011-11-21 DIAGNOSIS — I6529 Occlusion and stenosis of unspecified carotid artery: Secondary | ICD-10-CM

## 2011-11-21 NOTE — Assessment & Plan Note (Signed)
In 2011 this patient had a moderate 40-59% right carotid stenosis with a recent right retinal infarct. This reason we have recommended right carotid endarterectomy. However she did not elect to proceed with surgery at that time and was then lost to follow up. The stenosis has remained the same in the velocities have not increased at all. She has been asymptomatic since that time. This reason I think we should not consider right carotid endarterectomy unless she develops new right hemispheric symptoms or the stenosis progressed to greater than 80%. I have recommended a fall carotid duplex scan in 1 year. Will see her back at that time. She knows to call sooner she has problems. In the meantime she knows to continue taking her aspirin.

## 2011-11-21 NOTE — Progress Notes (Signed)
[facility]  Patient name: Sandra [name] MRN: [id number] DOB: [birth year] Sex: female  REASON FOR VISIT: follow up of carotid disease.  HPI: Sandra [name] is a 78 y.o. female who I seen back in September of 2011 after she had a right retinal infarct. At that time she was found to have a 40-59% right carotid stenosis and I have recommended right carotid endarterectomy given that the stenosis was symptomatic. However, at that time she elected not to proceed with surgery and then was lost to follow up. She returns now for a follow up study. Since I saw her last she's had no history of stroke, TIAs, expressive or receptive aphasia, or amaurosis fugax. Her only complaint has been some bleeding from hemorrhoids and also knee pain.  Past Medical History  Diagnosis Date  . Anemia   . Diabetes mellitus   . CAD (coronary artery disease)   . Peripheral vascular disease   . Hypertension   . Cancer     colon; s/p colectomy 2007    Family History  Problem Relation Age of Onset  . Cancer Mother   . Diabetes Mother   . Other Father     bleeding problems  . Diabetes Sister   . Heart disease Daughter     before age 69    SOCIAL HISTORY: History  Substance Use Topics  . Smoking status: Former Smoker    Types: Cigarettes    Quit date: 03/13/1967  . Smokeless tobacco: Never Used  . Alcohol Use: No    Allergies  Allergen Reactions  . Procaine Hcl     swelling  . Meloxicam     Shortness of breath    Current Outpatient Prescriptions  Medication Sig Dispense Refill  . amLODipine (NORVASC) 5 MG tablet Take 5 mg by mouth daily.      Marland Kitchen aspirin 81 MG tablet Take 81 mg by mouth 2 (two) times daily.       . calcium acetate, Phos Binder, (PHOSLYRA) 667 MG/5ML SOLN Take by mouth once.      . cholecalciferol (VITAMIN D) 1000 UNITS tablet Take 1,000 Units by mouth daily.      Marland Kitchen FLUoxetine (PROZAC) 20 MG tablet Take 20 mg by mouth daily.      . furosemide (LASIX) 20 MG  tablet Take 20 mg by mouth daily.      Marland Kitchen glipiZIDE (GLUCOTROL XL) 2.5 MG 24 hr tablet Take 2.5 mg by mouth daily.      . insulin glargine (LANTUS) 100 UNIT/ML injection Inject 42 Units into the skin at bedtime.      . metFORMIN (GLUMETZA) 1000 MG (MOD) 24 hr tablet Take 1,000 mg by mouth daily with breakfast.      . Multiple Vitamin (MULTIVITAMIN) tablet Take 1 tablet by mouth. Pt states she takes about 2 tabs per week      . potassium chloride SA (K-DUR,KLOR-CON) 20 MEQ tablet Take 20 mEq by mouth daily.      Marland Kitchen levothyroxine (SYNTHROID, LEVOTHROID) 125 MCG tablet Take 125 mcg by mouth.      . pediatric multivitamin-fluoride (POLY-VI-FLOR) 0.25 MG chewable tablet Chew 1 tablet by mouth daily.        REVIEW OF SYSTEMS: Arly.Keller ] denotes positive finding; [  ] denotes negative finding  CARDIOVASCULAR:  [ ]  chest pain   [ ]  chest pressure   [ ]  palpitations   [ ]  orthopnea   Arly.Keller ] dyspnea on exertion   [ ]   claudication   [ ]  rest pain   Arly.Keller ] DVT   Arly.Keller ] phlebitis PULMONARY:   [ ]  productive cough   [ ]  asthma   [ ]  wheezing NEUROLOGIC:   [ ]  weakness  [ ]  paresthesias  [ ]  aphasia  [ ]  amaurosis  [ ]  dizziness HEMATOLOGIC:   Arly.Keller ] bleeding problems   [ ]  clotting disorders MUSCULOSKELETAL:  [ ]  joint pain   [ ]  joint swelling [ ]  leg swelling GASTROINTESTINAL: [ ]   blood in stool  [ ]   hematemesis GENITOURINARY:  [ ]   dysuria  [ ]   hematuria PSYCHIATRIC:  [ ]  history of major depression INTEGUMENTARY:  Arly.Keller ] rashes  [ ]  ulcers CONSTITUTIONAL:  [ ]  fever   [ ]  chills  PHYSICAL EXAM: Filed Vitals:   11/21/11 1612 11/21/11 1615  BP: 174/44 168/84  Pulse: 66 61  Resp: 16 12  Height: 5\' 3"  (1.6 m)   Weight: 223 lb (101.152 kg)   SpO2: 96%    Body mass index is 39.50 kg/(m^2). GENERAL: The patient is a well-nourished female, in no acute distress. The vital signs are documented above. CARDIOVASCULAR: There is a regular rate and rhythm. I do not detect carotid bruits. Both feet are warm and  well-perfused. PULMONARY: There is good air exchange bilaterally without wheezing or rales. ABDOMEN: Soft and non-tender with normal pitched bowel sounds.  MUSCULOSKELETAL: There are no major deformities or cyanosis. NEUROLOGIC: No focal weakness or paresthesias are detected. SKIN: There are no ulcers or rashes noted. PSYCHIATRIC: The patient has a normal affect.  DATA:  I have independently interpreted her carotid duplex scan which shows a 40-59% right carotid stenosis with no significant stenosis on the left. Both vertebral arteries are patent with normally directed flow. The stenosis on the right has not progressed at all since the study back in 2011 2 years ago.  MEDICAL ISSUES:  Occlusion and stenosis of carotid artery without mention of cerebral infarction In 2011 this patient had a moderate 40-59% right carotid stenosis with a recent right retinal infarct. This reason we have recommended right carotid endarterectomy. However she did not elect to proceed with surgery at that time and was then lost to follow up. The stenosis has remained the same in the velocities have not increased at all. She has been asymptomatic since that time. This reason I think we should not consider right carotid endarterectomy unless she develops new right hemispheric symptoms or the stenosis progressed to greater than 80%. I have recommended a fall carotid duplex scan in 1 year. Will see her back at that time. She knows to call sooner she has problems. In the meantime she knows to continue taking her aspirin.   Artia Singley S [facility] [place] Beeper: [phone]

## 2011-12-13 ENCOUNTER — Other Ambulatory Visit: Payer: Medicare Other | Admitting: Lab

## 2011-12-13 ENCOUNTER — Ambulatory Visit: Payer: Medicare Other | Admitting: Hematology & Oncology

## 2011-12-13 ENCOUNTER — Telehealth: Payer: Self-pay | Admitting: Hematology & Oncology

## 2011-12-13 NOTE — Telephone Encounter (Signed)
Pt came in late for 10-3 she rescheduled for 10-31. She could only come in for 215 or 230 pm appointments

## 2012-01-10 ENCOUNTER — Other Ambulatory Visit (HOSPITAL_BASED_OUTPATIENT_CLINIC_OR_DEPARTMENT_OTHER): Payer: Medicare Other | Admitting: Lab

## 2012-01-10 ENCOUNTER — Ambulatory Visit (HOSPITAL_BASED_OUTPATIENT_CLINIC_OR_DEPARTMENT_OTHER): Payer: Medicare Other | Admitting: Medical

## 2012-01-10 VITALS — BP 125/41 | HR 57 | Temp 97.2°F | Resp 18 | Ht 63.0 in | Wt 236.0 lb

## 2012-01-10 DIAGNOSIS — C189 Malignant neoplasm of colon, unspecified: Secondary | ICD-10-CM

## 2012-01-10 DIAGNOSIS — D649 Anemia, unspecified: Secondary | ICD-10-CM

## 2012-01-10 LAB — CBC WITH DIFFERENTIAL (CANCER CENTER ONLY)
BASO#: 0.1 10*3/uL (ref 0.0–0.2)
Eosinophils Absolute: 0.5 10*3/uL (ref 0.0–0.5)
HCT: 28.7 % — ABNORMAL LOW (ref 34.8–46.6)
LYMPH%: 13.8 % — ABNORMAL LOW (ref 14.0–48.0)
MCH: 27.9 pg (ref 26.0–34.0)
MCV: 88 fL (ref 81–101)
MONO#: 0.6 10*3/uL (ref 0.1–0.9)
MONO%: 6.1 % (ref 0.0–13.0)
NEUT%: 74.5 % (ref 39.6–80.0)
Platelets: 279 10*3/uL (ref 145–400)
RBC: 3.26 10*6/uL — ABNORMAL LOW (ref 3.70–5.32)
WBC: 9.5 10*3/uL (ref 3.9–10.0)

## 2012-01-10 NOTE — Progress Notes (Signed)
Diagnosis: Stage IIIB (T3, N1, M0) adenocarcinoma of the colon.  Current therapy: Observation.  Interim history: Sandra [name] presents today for an office followup visit.  Overall, she, reports, that for about the past 10 days.  She has had more fatigue, and weakness, than her usual.  I did notice going back in her lab work, that she's been anemic for quite some time.  I did go ahead and order an iron panel on her as well.  I asked Sandra [name], if she was ever told in the past.  That she was iron deficient.  She reports, that ever should, since she can remember.  She has always had "low iron."  She recalls several years ago.  Getting blood transfusions, but not any IV iron.  She has been placed on oral iron in the past, but was unable to tolerate it.  She reports, that she does have hemorrhoids that bleed.  She does not report any obvious, or abnormal bleeding.  She denies any vaginal bleeding, hemoptysis, hematemesis or melena.  She reports, she has a good appetite.  She denies any nausea, vomiting, or diarrhea.  She does have intermittent constipation, at times.  She denies any cough, chest pain, shortness of breath.  Any fevers, chills, or night sweats.  She denies any abdominal pain, or any type of bony pain.  She denies any headaches, visual changes, or rashes.  Her last CEA back in April, was 1.7.  She reports, her last colonoscopy was in 2012.  She reports, that she's not due for another one until 2017.  Apparently, she, states, she was supposed to have an endoscopy, when she had her last colonoscopy, but never happened.  I did advise her that she may have to follow back up with her gastroenterologist for an endoscopy.  Of note, Sandra [name] states, that she does followup with a cardiologist on a regular basis.  She also sees a vascular pain specialist, Dr. Edilia Bo.  Sandra [name] states, that she was told.  A while back.  That she would need open-heart surgery for a dysfunctional mitral valve.  Sandra [name] also  states, that back in September of 2011.  She had a right retinal infarct.  Apparently at that time she was found to have a 40-59%. right carotid stenosis.  Dr. Edilia Bo recommended a right carotid endarterectomy given the fact that the stenosis was symptomatic.  She had elected not to proceed with the surgery.  I did advise her to followup with these issues.  Review of Systems: Constitutional: Positive for malaise/fatigue, Negative for fever, chills, weight loss, diaphoresis, activity change, appetite change, and unexpected weight change.  HEENT: Negative for double vision, blurred vision, visual loss, ear pain, tinnitus, congestion, rhinorrhea, epistaxis sore throat or sinus disease, oral pain/lesion, tongue soreness Respiratory: Negative for cough, chest tightness, shortness of breath, wheezing and stridor.  Cardiovascular: Negative for chest pain, palpitations, leg swelling, orthopnea, PND, DOE or claudication Gastrointestinal: Negative for nausea, vomiting, abdominal pain, diarrhea, constipation, blood in stool, melena, hematochezia, abdominal distention, anal bleeding, rectal pain, anorexia and hematemesis.  Genitourinary: Negative for dysuria, frequency, hematuria,  Musculoskeletal: Negative for myalgias, back pain, joint swelling, arthralgias and gait problem.  Skin: Negative for rash, color change, pallor and wound.  Neurological:. Negative for dizziness/light-headedness, tremors, seizures, syncope, facial asymmetry, speech difficulty, weakness, numbness, headaches and paresthesias.  Hematological: Negative for adenopathy. Does not bruise/bleed easily.  Psychiatric/Behavioral:  Negative for depression, no loss of interest in normal activity or change in sleep  pattern.   Physical Exam: This is an obese, white, 78 year old, female, in no obvious distress Vitals: Temperature 97.2 degrees, pulse 74, respirations 18, blood pressure 125/41, weight 236 pounds HEENT reveals a normocephalic,  atraumatic skull, no scleral icterus, no oral lesions  Neck is supple without any cervical or supraclavicular adenopathy.  Lungs are clear to auscultation bilaterally. There are no wheezes, rales or rhonci Cardiac is regular rate and rhythm with a normal S1 and S2. There are no murmurs, rubs, or bruits.  Abdomen is soft with good bowel sounds, there is no palpable mass. There is no palpable hepatosplenomegaly. There is no palpable fluid wave.  Musculoskeletal no tenderness of the spine, ribs, or hips.  Extremities there are no clubbing, cyanosis, or edema.  Skin no petechia, purpura or ecchymosis Neurologic is nonfocal.  Laboratory Data: White count 9.5, hemoglobin 9.1, hematocrit 28.7, platelets 279,000  Current Outpatient Prescriptions on File Prior to Visit  Medication Sig Dispense Refill  . amLODipine (NORVASC) 5 MG tablet Take 5 mg by mouth daily.      Marland Kitchen aspirin 81 MG tablet Take 81 mg by mouth 2 (two) times daily.       . calcium acetate, Phos Binder, (PHOSLYRA) 667 MG/5ML SOLN Take by mouth once.      . cholecalciferol (VITAMIN D) 1000 UNITS tablet Take 1,000 Units by mouth daily.      Marland Kitchen FLUoxetine (PROZAC) 20 MG tablet Take 20 mg by mouth daily.      . furosemide (LASIX) 20 MG tablet Take 20 mg by mouth daily.      Marland Kitchen glipiZIDE (GLUCOTROL XL) 2.5 MG 24 hr tablet Take 2.5 mg by mouth daily.      . insulin glargine (LANTUS) 100 UNIT/ML injection Inject 42 Units into the skin at bedtime.      Marland Kitchen levothyroxine (SYNTHROID, LEVOTHROID) 125 MCG tablet Take 125 mcg by mouth.      . metFORMIN (GLUMETZA) 1000 MG (MOD) 24 hr tablet Take 1,000 mg by mouth daily with breakfast.      . Multiple Vitamin (MULTIVITAMIN) tablet Take 1 tablet by mouth. Pt states she takes about 2 tabs per week      . pediatric multivitamin-fluoride (POLY-VI-FLOR) 0.25 MG chewable tablet Chew 1 tablet by mouth daily.      . potassium chloride SA (K-DUR,KLOR-CON) 20 MEQ tablet Take 20 mEq by mouth daily.        Assessment/Plan: This is a pleasant, 78 year old, white female, with the following issues:  #1.  History of stage IIIB colon cancer.  She is now about 5 years out from completion of her adjuvant therapy.  I do not see any evidence of recurrence today.    #2  Anemia.  She's been quite anemic for some time.  I'm going to go ahead and get an iron panel on her today and erythropoietin level.  I do suspect, that she is iron deficient.  If this is the case.  We will have her come back for IV iron.    #3 followup in.  Ms. Burkhammer will follow back up with [place] in 6 months, but before then should there be questions or concerns.

## 2012-01-11 ENCOUNTER — Telehealth: Payer: Self-pay | Admitting: Hematology & Oncology

## 2012-01-11 ENCOUNTER — Other Ambulatory Visit: Payer: Self-pay | Admitting: Medical

## 2012-01-11 LAB — COMPREHENSIVE METABOLIC PANEL
Alkaline Phosphatase: 61 U/L (ref 39–117)
BUN: 28 mg/dL — ABNORMAL HIGH (ref 6–23)
CO2: 24 mEq/L (ref 19–32)
Creatinine, Ser: 1.51 mg/dL — ABNORMAL HIGH (ref 0.50–1.10)
Glucose, Bld: 140 mg/dL — ABNORMAL HIGH (ref 70–99)
Sodium: 138 mEq/L (ref 135–145)
Total Bilirubin: 0.5 mg/dL (ref 0.3–1.2)
Total Protein: 6.5 g/dL (ref 6.0–8.3)

## 2012-01-11 LAB — RETICULOCYTES (CHCC)
ABS Retic: 73.5 10*3/uL (ref 19.0–186.0)
RBC.: 3.34 MIL/uL — ABNORMAL LOW (ref 3.87–5.11)

## 2012-01-11 LAB — IRON AND TIBC: TIBC: 378 ug/dL (ref 250–470)

## 2012-01-11 LAB — FERRITIN: Ferritin: 41 ng/mL (ref 10–291)

## 2012-01-11 NOTE — Telephone Encounter (Signed)
Pt aware of 11-7 iron

## 2012-01-17 ENCOUNTER — Ambulatory Visit (HOSPITAL_BASED_OUTPATIENT_CLINIC_OR_DEPARTMENT_OTHER): Payer: Medicare Other

## 2012-01-17 VITALS — BP 173/62 | HR 82 | Temp 97.7°F | Resp 20

## 2012-01-17 DIAGNOSIS — D509 Iron deficiency anemia, unspecified: Secondary | ICD-10-CM

## 2012-01-17 HISTORY — DX: Iron deficiency anemia, unspecified: D50.9

## 2012-01-17 MED ORDER — SODIUM CHLORIDE 0.9 % IV SOLN
Freq: Once | INTRAVENOUS | Status: AC
  Administered 2012-01-17: 14:00:00 via INTRAVENOUS

## 2012-01-17 MED ORDER — SODIUM CHLORIDE 0.9 % IV SOLN
1020.0000 mg | Freq: Once | INTRAVENOUS | Status: AC
  Administered 2012-01-17: 1020 mg via INTRAVENOUS
  Filled 2012-01-17: qty 34

## 2012-01-17 NOTE — Patient Instructions (Signed)
Ferumoxytol injection What is this medicine? FERUMOXYTOL is an iron complex. Iron is used to make healthy red blood cells, which carry oxygen and nutrients throughout the body. This medicine is used to treat iron deficiency anemia in people with chronic kidney disease. This medicine may be used for other purposes; ask your health care provider or pharmacist if you have questions. What should I tell my health care provider before I take this medicine? They need to know if you have any of these conditions: -anemia not caused by low iron levels -high levels of iron in the blood -magnetic resonance imaging (MRI) test scheduled -an unusual or allergic reaction to iron, other medicines, foods, dyes, or preservatives -pregnant or trying to get pregnant -breast-feeding How should I use this medicine? This medicine is for infusion into a vein. It is given by a health care professional in a hospital or clinic setting. Talk to your pediatrician regarding the use of this medicine in children. Special care may be needed. Overdosage: If you think you've taken too much of this medicine contact a poison control center or emergency room at once. Overdosage: If you think you have taken too much of this medicine contact a poison control center or emergency room at once. NOTE: This medicine is only for you. Do not share this medicine with others. What if I miss a dose? It is important not to miss your dose. Call your doctor or health care professional if you are unable to keep an appointment. What may interact with this medicine? This medicine may interact with the following medications: -other iron products This list may not describe all possible interactions. Give your health care provider a list of all the medicines, herbs, non-prescription drugs, or dietary supplements you use. Also tell them if you smoke, drink alcohol, or use illegal drugs. Some items may interact with your medicine. What should I watch  for while using this medicine? Visit your doctor or healthcare professional regularly. Tell your doctor or healthcare professional if your symptoms do not start to get better or if they get worse. You may need blood work done while you are taking this medicine. You may need to follow a special diet. Talk to your doctor. Foods that contain iron include: whole grains/cereals, dried fruits, beans, or peas, leafy green vegetables, and organ meats (liver, kidney). What side effects may I notice from receiving this medicine? Side effects that you should report to your doctor or health care professional as soon as possible: -allergic reactions like skin rash, itching or hives, swelling of the face, lips, or tongue -breathing problems -changes in blood pressure -feeling faint or lightheaded, falls -fever or chills -flushing, sweating, or hot feelings -swelling of the ankles or feet Side effects that usually do not require medical attention (Report these to your doctor or health care professional if they continue or are bothersome.): -diarrhea -headache -nausea, vomiting -stomach pain This list may not describe all possible side effects. Call your doctor for medical advice about side effects. You may report side effects to FDA at 1-800-FDA-1088. Where should I keep my medicine? This drug is given in a hospital or clinic and will not be stored at home. NOTE: This sheet is a summary. It may not cover all possible information. If you have questions about this medicine, talk to your doctor, pharmacist, or health care provider.  2012, [employer]vier/[employer] Standard. (11/19/2007 9:48:25 PM) 

## 2012-01-31 ENCOUNTER — Other Ambulatory Visit: Payer: Self-pay | Admitting: *Deleted

## 2012-01-31 DIAGNOSIS — I6529 Occlusion and stenosis of unspecified carotid artery: Secondary | ICD-10-CM

## 2012-03-08 ENCOUNTER — Encounter (HOSPITAL_COMMUNITY): Payer: Self-pay | Admitting: *Deleted

## 2012-03-08 ENCOUNTER — Emergency Department (HOSPITAL_COMMUNITY): Payer: Medicare Other

## 2012-03-08 ENCOUNTER — Observation Stay (HOSPITAL_COMMUNITY)
Admission: EM | Admit: 2012-03-08 | Discharge: 2012-03-09 | Disposition: A | Discharge status: Home / Self Care | Payer: Medicare Other | Attending: Emergency Medicine | Admitting: Emergency Medicine

## 2012-03-08 DIAGNOSIS — I2510 Atherosclerotic heart disease of native coronary artery without angina pectoris: Secondary | ICD-10-CM | POA: Insufficient documentation

## 2012-03-08 DIAGNOSIS — L02619 Cutaneous abscess of unspecified foot: Principal | ICD-10-CM | POA: Insufficient documentation

## 2012-03-08 DIAGNOSIS — L0390 Cellulitis, unspecified: Secondary | ICD-10-CM

## 2012-03-08 DIAGNOSIS — Z85038 Personal history of other malignant neoplasm of large intestine: Secondary | ICD-10-CM | POA: Insufficient documentation

## 2012-03-08 DIAGNOSIS — Z8719 Personal history of other diseases of the digestive system: Secondary | ICD-10-CM | POA: Insufficient documentation

## 2012-03-08 DIAGNOSIS — Z794 Long term (current) use of insulin: Secondary | ICD-10-CM | POA: Insufficient documentation

## 2012-03-08 DIAGNOSIS — L03119 Cellulitis of unspecified part of limb: Secondary | ICD-10-CM | POA: Insufficient documentation

## 2012-03-08 DIAGNOSIS — E119 Type 2 diabetes mellitus without complications: Secondary | ICD-10-CM | POA: Insufficient documentation

## 2012-03-08 DIAGNOSIS — E860 Dehydration: Secondary | ICD-10-CM

## 2012-03-08 DIAGNOSIS — R0602 Shortness of breath: Secondary | ICD-10-CM | POA: Insufficient documentation

## 2012-03-08 DIAGNOSIS — Z7982 Long term (current) use of aspirin: Secondary | ICD-10-CM | POA: Insufficient documentation

## 2012-03-08 DIAGNOSIS — I10 Essential (primary) hypertension: Secondary | ICD-10-CM | POA: Insufficient documentation

## 2012-03-08 DIAGNOSIS — Z87891 Personal history of nicotine dependence: Secondary | ICD-10-CM | POA: Insufficient documentation

## 2012-03-08 DIAGNOSIS — Z862 Personal history of diseases of the blood and blood-forming organs and certain disorders involving the immune mechanism: Secondary | ICD-10-CM | POA: Insufficient documentation

## 2012-03-08 DIAGNOSIS — Z79899 Other long term (current) drug therapy: Secondary | ICD-10-CM | POA: Insufficient documentation

## 2012-03-08 HISTORY — DX: Cellulitis, unspecified: L03.90

## 2012-03-08 HISTORY — DX: Epistaxis: R04.0

## 2012-03-08 HISTORY — DX: Unspecified hemorrhoids: K64.9

## 2012-03-08 LAB — BASIC METABOLIC PANEL
BUN: 26 mg/dL — ABNORMAL HIGH (ref 6–23)
CO2: 25 mEq/L (ref 19–32)
Chloride: 94 mEq/L — ABNORMAL LOW (ref 96–112)
Creatinine, Ser: 1.32 mg/dL — ABNORMAL HIGH (ref 0.50–1.10)
Potassium: 4.1 mEq/L (ref 3.5–5.1)

## 2012-03-08 LAB — CBC WITH DIFFERENTIAL/PLATELET
Basophils Absolute: 0 10*3/uL (ref 0.0–0.1)
Eosinophils Relative: 1 % (ref 0–5)
HCT: 31 % — ABNORMAL LOW (ref 36.0–46.0)
Hemoglobin: 9.7 g/dL — ABNORMAL LOW (ref 12.0–15.0)
Lymphocytes Relative: 10 % — ABNORMAL LOW (ref 12–46)
MCV: 89.3 fL (ref 78.0–100.0)
Monocytes Absolute: 1.3 10*3/uL — ABNORMAL HIGH (ref 0.1–1.0)
Monocytes Relative: 8 % (ref 3–12)
Neutro Abs: 12.7 10*3/uL — ABNORMAL HIGH (ref 1.7–7.7)
RDW: 16.2 % — ABNORMAL HIGH (ref 11.5–15.5)
WBC: 15.7 10*3/uL — ABNORMAL HIGH (ref 4.0–10.5)

## 2012-03-08 LAB — URINALYSIS, ROUTINE W REFLEX MICROSCOPIC
Bilirubin Urine: NEGATIVE
Glucose, UA: NEGATIVE mg/dL
Hgb urine dipstick: NEGATIVE
Specific Gravity, Urine: 1.012 (ref 1.005–1.030)
Urobilinogen, UA: 1 mg/dL (ref 0.0–1.0)
pH: 6.5 (ref 5.0–8.0)

## 2012-03-08 LAB — URINE MICROSCOPIC-ADD ON

## 2012-03-08 MED ORDER — SODIUM CHLORIDE 0.9 % IV SOLN
Freq: Once | INTRAVENOUS | Status: AC
  Administered 2012-03-08: 100 mL/h via INTRAVENOUS

## 2012-03-08 MED ORDER — AMPICILLIN-SULBACTAM SODIUM 3 (2-1) G IJ SOLR
3.0000 g | Freq: Once | INTRAMUSCULAR | Status: AC
  Administered 2012-03-08: 3 g via INTRAVENOUS
  Filled 2012-03-08: qty 3

## 2012-03-08 MED ORDER — OXYCODONE-ACETAMINOPHEN 5-325 MG PO TABS
1.0000 | ORAL_TABLET | Freq: Once | ORAL | Status: AC
  Administered 2012-03-08: 1 via ORAL
  Filled 2012-03-08: qty 1

## 2012-03-08 NOTE — ED Notes (Signed)
Pt refuses to put gown on at this time.

## 2012-03-08 NOTE — ED Notes (Signed)
Son: Royal Piedra [address] [phone], cell [phone].

## 2012-03-08 NOTE — ED Notes (Addendum)
Here for concern for wound infection on L bilateral feet, L>R, c/o pain, drainage and swelling, also subjective fever. Redness noted.  H/o DM. First noticed 6 weeks ago. Pt of Dr. Kirby Funk. Does not check CBG at home. Also mentions recent sob. Wants to see an endocrinologist. Here with son.

## 2012-03-09 DIAGNOSIS — M79609 Pain in unspecified limb: Secondary | ICD-10-CM

## 2012-03-09 LAB — GLUCOSE, CAPILLARY: Glucose-Capillary: 146 mg/dL — ABNORMAL HIGH (ref 70–99)

## 2012-03-09 MED ORDER — FUROSEMIDE 20 MG PO TABS: 20.0000 mg | ORAL_TABLET | Freq: Every day | ORAL | Status: DC

## 2012-03-09 MED ORDER — DOXYCYCLINE HYCLATE 100 MG PO CAPS: 100.0000 mg | ORAL_CAPSULE | Freq: Two times a day (BID) | ORAL | Status: DC

## 2012-03-09 MED ORDER — AMLODIPINE BESYLATE 5 MG PO TABS
5.0000 mg | ORAL_TABLET | Freq: Every day | ORAL | Status: DC
  Filled 2012-03-09: qty 1

## 2012-03-09 MED ORDER — METFORMIN HCL ER 500 MG PO TB24
1000.0000 mg | ORAL_TABLET | Freq: Two times a day (BID) | ORAL | Status: DC
  Administered 2012-03-09: 1000 mg via ORAL
  Filled 2012-03-09 (×3): qty 2

## 2012-03-09 NOTE — Progress Notes (Signed)
VASCULAR LAB PRELIMINARY  PRELIMINARY  PRELIMINARY  PRELIMINARY  Left lower extremity venous Doppler completed.    Preliminary report:  There is no obvious evidence of DVT or SVT noted in the left lower extremity.  Jessaca Philippi, RVT 03/09/2012, 7:35 AM

## 2012-03-09 NOTE — ED Notes (Signed)
Doppler completed.

## 2012-03-09 NOTE — ED Provider Notes (Signed)
History     CSN: [id number]  Arrival date & time 03/08/12  1956   First MD Initiated Contact with Patient 03/08/12 2301      Chief Complaint  Patient presents with  . Foot Pain  . Wound Check     Patient is a 78 y.o. female presenting with lower extremity pain. The history is provided by the patient.  Foot Pain This is a recurrent problem. The current episode started more than 1 week ago. The problem occurs daily. The problem has been gradually worsening. Associated symptoms include shortness of breath. Pertinent negatives include no chest pain and no abdominal pain. The symptoms are aggravated by walking. She has tried rest for the symptoms. The treatment provided mild relief.  pt reports bilateral foot pain for over a month and she thinks they "may be infected" She reports h/o diabetes and is requesting referral to podiatrist No fever/vomiting She also reports intermittent mild SOB but none at this time and no CP reported She denies known h/o DVT She reports the left foot/leg are worsening Past Medical History  Diagnosis Date  . Anemia   . Diabetes mellitus   . CAD (coronary artery disease)   . Peripheral vascular disease   . Hypertension   . Cancer     colon; s/p colectomy 2007  . Anemia, iron deficiency 01/17/2012  . Hemorrhoids   . Nosebleed     Past Surgical History  Procedure Date  . Appendectomy   . Cholecystectomy   . Tonsilectomy, adenoidectomy, bilateral myringotomy and tubes   . Colon surgery     for colon CA    Family History  Problem Relation Age of Onset  . Cancer Mother   . Diabetes Mother   . Other Father     bleeding problems  . Diabetes Sister   . Heart disease Daughter     before age 37    History  Substance Use Topics  . Smoking status: Former Smoker    Types: Cigarettes    Quit date: 03/13/1967  . Smokeless tobacco: Never Used  . Alcohol Use: No    OB History    Grav Para Term Preterm Abortions TAB SAB Ect Mult Living           Review of Systems  Constitutional: Negative for fever.  Respiratory: Positive for shortness of breath.   Cardiovascular: Negative for chest pain.  Gastrointestinal: Negative for abdominal pain.  All other systems reviewed and are negative.    Allergies  Procaine hcl and Meloxicam  Home Medications   Current Outpatient Rx  Name  Route  Sig  Dispense  Refill  . AMLODIPINE BESYLATE 5 MG PO TABS   Oral   Take 5 mg by mouth daily.         . ASPIRIN 81 MG PO TABS   Oral   Take 81 mg by mouth daily.          Marland Kitchen VITAMIN D 1000 UNITS PO TABS   Oral   Take 1,000 Units by mouth daily.         Marland Kitchen FLUOXETINE HCL 20 MG PO TABS   Oral   Take 20 mg by mouth daily.         . FUROSEMIDE 20 MG PO TABS   Oral   Take 20 mg by mouth daily.         . INSULIN GLARGINE 100 UNIT/ML [place] SOLN   Subcutaneous   Inject 34 Units into the skin  at bedtime.          Marland Kitchen LEVOTHYROXINE SODIUM 125 MCG PO TABS   Oral   Take 125 mcg by mouth.         . METFORMIN HCL ER (MOD) 1000 MG PO TB24   Oral   Take 1,000 mg by mouth 2 (two) times daily with a meal.          . CALCIUM/MAGNESIUM/ZINC PO   Oral   Take 1 tablet by mouth daily.           BP 121/62  Pulse 78  Temp 98.4 F (36.9 C) (Oral)  Resp 20  SpO2 92%  Physical Exam CONSTITUTIONAL: Well developed/well nourished HEAD AND FACE: Normocephalic/atraumatic EYES: EOMI/PERRL ENMT: Mucous membranes moist NECK: supple no meningeal signs SPINE:entire spine nontender CV: S1/S2 noted LUNGS: Lungs are clear to auscultation bilaterally, no apparent distress ABDOMEN: soft, nontender, no rebound or guarding NEURO: Pt is awake/alert, moves all extremitiesx4 EXTREMITIES: pulses normal, full ROM Left LE - erythema/tenderness/edema noted to left tibial surface.  Calf tenderness is noted On both feet pt has prominent bunions that are tender and erythematous.  No open wounds/puncture wounds are noted.   SKIN: warm PSYCH: no  abnormalities of mood noted  ED Course  Procedures   Labs Reviewed  BASIC METABOLIC PANEL - Abnormal; Notable for the following:    Sodium 130 (*)     Chloride 94 (*)     Glucose, Bld 150 (*)     BUN 26 (*)     Creatinine, Ser 1.32 (*)     GFR calc non Af Amer 37 (*)     GFR calc Af Amer 43 (*)     All other components within normal limits  CBC WITH DIFFERENTIAL - Abnormal; Notable for the following:    WBC 15.7 (*)     RBC 3.47 (*)     Hemoglobin 9.7 (*)     HCT 31.0 (*)     RDW 16.2 (*)     Neutrophils Relative 81 (*)     Neutro Abs 12.7 (*)     Lymphocytes Relative 10 (*)     Monocytes Absolute 1.3 (*)     All other components within normal limits  PRO B NATRIURETIC PEPTIDE - Abnormal; Notable for the following:    Pro B Natriuretic peptide (BNP) 3174.0 (*)     All other components within normal limits  URINALYSIS, ROUTINE W REFLEX MICROSCOPIC - Abnormal; Notable for the following:    Protein, ur 100 (*)     Leukocytes, UA MODERATE (*)     All other components within normal limits  URINE MICROSCOPIC-ADD ON   Dg Chest 2 View  03/08/2012  *RADIOLOGY REPORT*  Clinical Data: Foot pain.  Wound check.  History of diabetes, hypertension.  CHEST - 2 VIEW  Comparison: 12/21/2009  Findings: The heart is mildly enlarged.  There is perihilar peribronchial thickening.  Mild interstitial edema is noted.  There is bibasilar atelectasis.  No focal consolidations or definite pleural effusions are identified.  There is mild degenerative change in the thoracic spine.  Moderate kyphosis.  IMPRESSION:  1.  Cardiomegaly and mild interstitial edema. 2.  Bronchitic changes.   Original Report Authenticated By: Norva Pavlov, M.D.     12:54 AM Pt here for foot pain.  I am more concerned she has cellulitis and potentially DVT in the left LE.  She will need IV antibiotics and DVT study as well.  Pt is  poor historian, it is unclear when the erythema started in her leg.  Will start on unasyn.  Will  need home antibiotics if DVT study is negative.     MDM  Nursing notes including past medical history and social history reviewed and considered in documentation Labs/vital reviewed and considered xrays reviewed and considered         Joya Gaskins, MD 03/09/12 [phone]

## 2012-07-10 ENCOUNTER — Ambulatory Visit: Payer: Medicare Other | Admitting: Hematology & Oncology

## 2012-07-10 ENCOUNTER — Other Ambulatory Visit (HOSPITAL_BASED_OUTPATIENT_CLINIC_OR_DEPARTMENT_OTHER): Payer: Medicare Other | Admitting: Lab

## 2012-07-10 ENCOUNTER — Other Ambulatory Visit: Payer: Medicare Other | Admitting: Lab

## 2012-07-10 ENCOUNTER — Ambulatory Visit (HOSPITAL_BASED_OUTPATIENT_CLINIC_OR_DEPARTMENT_OTHER): Payer: Medicare Other | Admitting: Hematology & Oncology

## 2012-07-10 VITALS — BP 145/58 | HR 79 | Temp 98.1°F | Resp 16 | Ht 63.0 in | Wt 214.0 lb

## 2012-07-10 DIAGNOSIS — D649 Anemia, unspecified: Secondary | ICD-10-CM

## 2012-07-10 DIAGNOSIS — Z85038 Personal history of other malignant neoplasm of large intestine: Secondary | ICD-10-CM

## 2012-07-10 DIAGNOSIS — C787 Secondary malignant neoplasm of liver and intrahepatic bile duct: Secondary | ICD-10-CM

## 2012-07-10 DIAGNOSIS — Z9189 Other specified personal risk factors, not elsewhere classified: Secondary | ICD-10-CM

## 2012-07-10 DIAGNOSIS — E11622 Type 2 diabetes mellitus with other skin ulcer: Secondary | ICD-10-CM

## 2012-07-10 DIAGNOSIS — C189 Malignant neoplasm of colon, unspecified: Secondary | ICD-10-CM

## 2012-07-10 LAB — CBC WITH DIFFERENTIAL (CANCER CENTER ONLY)
BASO%: 0.9 % (ref 0.0–2.0)
EOS%: 8.9 % — ABNORMAL HIGH (ref 0.0–7.0)
LYMPH#: 1.8 10*3/uL (ref 0.9–3.3)
MCHC: 31.8 g/dL — ABNORMAL LOW (ref 32.0–36.0)
MONO#: 0.6 10*3/uL (ref 0.1–0.9)
NEUT%: 67.5 % (ref 39.6–80.0)
Platelets: 240 10*3/uL (ref 145–400)
RDW: 13.6 % (ref 11.1–15.7)
WBC: 10.5 10*3/uL — ABNORMAL HIGH (ref 3.9–10.0)

## 2012-07-10 LAB — COMPREHENSIVE METABOLIC PANEL
ALT: 11 U/L (ref 0–35)
AST: 10 U/L (ref 0–37)
CO2: 27 mEq/L (ref 19–32)
Calcium: 9.1 mg/dL (ref 8.4–10.5)
Chloride: 102 mEq/L (ref 96–112)
Creatinine, Ser: 1.45 mg/dL — ABNORMAL HIGH (ref 0.50–1.10)
Sodium: 140 mEq/L (ref 135–145)
Total Bilirubin: 0.3 mg/dL (ref 0.3–1.2)
Total Protein: 6.5 g/dL (ref 6.0–8.3)

## 2012-07-10 LAB — CEA: CEA: 2.6 ng/mL (ref 0.0–5.0)

## 2012-07-10 NOTE — Progress Notes (Signed)
This office note has been dictated.

## 2012-07-11 ENCOUNTER — Telehealth: Payer: Self-pay | Admitting: Hematology & Oncology

## 2012-07-11 NOTE — Telephone Encounter (Signed)
Nancy at wound center made 5-12 10 am appointment for Pt. Pt aware of appointment but is going to call and reschedule, she says she has other appointments in may.

## 2012-07-11 NOTE — Progress Notes (Signed)
DIAGNOSIS:  Stage IIIB (T3 N1 M0) adenocarcinoma of the colon.  CURRENT THERAPY:  Observation.  INTERIM HISTORY:  Ms. Quizon comes in for followup.  From a colon cancer point of view, she is doing well.  Unfortunately, from a diabetes point of view, she is not doing well.  Apparently, since December, she has been having problems with ulcers on her lower legs.  Her left leg really looks worse than her right leg. She has not seen the [facility] Clinic.  To me, I think this really is something that needs to happen.  This is just my opinion.  She is applying some cream to the legs.  She, I think, was on some antibiotics.  She was not that sure.  She just says her legs bother her quite a bit.  She says they are painful.  I hope she does not have osteomyelitis which, I suppose, could be a possibility.  She has not had any issues with obvious fever.  There is no nausea or vomiting.  There has been no change in bowel or bladder habits.  Again, her colon cancer really is not an active issue from my point of view.  When she was last seen, her tumor marker was a 2.5.  This was back in October.  PHYSICAL EXAMINATION:  General:  This is an obese white female in no obvious distress.  Vital signs:  Temperature of 98.1, pulse 79, respiratory rate 16, blood pressure 145/58.  Weight is 214.  Head and neck:  Normocephalic, atraumatic skull.  There are no ocular or oral lesions.  There are no palpable cervical or supraclavicular lymph nodes. Lungs:  Clear bilaterally.  Cardiac:  Regular rate and rhythm with a normal S1 and S2.  There are no murmurs, rubs, or bruits.  Abdomen: Soft with good bowel sounds.  She is obese.  She has a well-healed laparotomy scar.  There is no fluid wave.  There is no palpable hepatosplenomegaly.  Back:  No tenderness over the spine, ribs, or hips. Extremities:  Non-pitting edema of the left leg.  She has marked erythema from the mid lower left leg down to the ankle.  She  has an ulceration with some granulation tissue on the lower aspect of the left lower leg.  There is some slight non-pitting edema of the right leg. She also has a poorly healing ulcer in the medial aspect of the right lower leg.  There is some erythema.  Both feet look okay.  Pulses seem to be decent.  LABORATORY STUDIES:  White cell count is 10.5, hemoglobin 11.5, hematocrit 36.2, platelet count 240.  IMPRESSION:  Ms. Kohlman is a very nice 79 year old white female.  To me, her biggest problem is this diabetic leg issue.  I really think that, again, the Wound Care Clinic would not be a bad idea for her.  From my point of view, I just do not think colon cancer recurrence is going to be a problem.  It has been about 6 years, I think, since she completed treatments.  We will get her back in 6 months' time.    ______________________________ Josph Macho, M.D. PRE/MEDQ  D:  07/10/2012  T:  07/11/2012  Job:  [id number]

## 2012-07-21 ENCOUNTER — Encounter (HOSPITAL_BASED_OUTPATIENT_CLINIC_OR_DEPARTMENT_OTHER): Payer: Medicare Other | Attending: Plastic Surgery

## 2012-07-21 DIAGNOSIS — L97809 Non-pressure chronic ulcer of other part of unspecified lower leg with unspecified severity: Secondary | ICD-10-CM | POA: Insufficient documentation

## 2012-07-21 DIAGNOSIS — E1169 Type 2 diabetes mellitus with other specified complication: Secondary | ICD-10-CM | POA: Insufficient documentation

## 2012-08-11 ENCOUNTER — Other Ambulatory Visit: Payer: Self-pay | Admitting: *Deleted

## 2012-08-11 DIAGNOSIS — L97909 Non-pressure chronic ulcer of unspecified part of unspecified lower leg with unspecified severity: Secondary | ICD-10-CM

## 2012-08-11 DIAGNOSIS — R0989 Other specified symptoms and signs involving the circulatory and respiratory systems: Secondary | ICD-10-CM

## 2012-08-13 ENCOUNTER — Encounter (HOSPITAL_BASED_OUTPATIENT_CLINIC_OR_DEPARTMENT_OTHER): Payer: Medicare Other | Attending: General Surgery

## 2012-08-13 DIAGNOSIS — E1169 Type 2 diabetes mellitus with other specified complication: Secondary | ICD-10-CM | POA: Insufficient documentation

## 2012-08-13 DIAGNOSIS — L97809 Non-pressure chronic ulcer of other part of unspecified lower leg with unspecified severity: Secondary | ICD-10-CM | POA: Insufficient documentation

## 2012-08-20 ENCOUNTER — Ambulatory Visit: Payer: Medicare Other | Admitting: Vascular Surgery

## 2012-08-26 ENCOUNTER — Encounter: Payer: Self-pay | Admitting: Vascular Surgery

## 2012-08-27 ENCOUNTER — Ambulatory Visit (INDEPENDENT_AMBULATORY_CARE_PROVIDER_SITE_OTHER): Payer: Medicare Other | Admitting: Vascular Surgery

## 2012-08-27 ENCOUNTER — Encounter: Payer: Self-pay | Admitting: Vascular Surgery

## 2012-08-27 ENCOUNTER — Encounter (INDEPENDENT_AMBULATORY_CARE_PROVIDER_SITE_OTHER): Payer: Medicare Other | Admitting: *Deleted

## 2012-08-27 VITALS — BP 151/66 | HR 75 | Resp 18 | Ht 61.0 in | Wt 213.0 lb

## 2012-08-27 DIAGNOSIS — L97909 Non-pressure chronic ulcer of unspecified part of unspecified lower leg with unspecified severity: Secondary | ICD-10-CM

## 2012-08-27 DIAGNOSIS — M79609 Pain in unspecified limb: Secondary | ICD-10-CM | POA: Insufficient documentation

## 2012-08-27 DIAGNOSIS — R0989 Other specified symptoms and signs involving the circulatory and respiratory systems: Secondary | ICD-10-CM

## 2012-08-27 NOTE — Progress Notes (Signed)
[facility] [place] HISTORY AND PHYSICAL   CC:  Bilateral leg ulcers  Sandra Mountain, MD  HPI: This is a 79 y.o. female  Who is seen in the wound care clinic for bilateral extremity non healing wounds.  She states that on January 24, 2012, she was trying to break in some new shoes and when she took her shoes off that day, she had blisters on her bilateral bunions.  She states that she went to the hospital March 08, 2012 where she received IV Abx and then po ABx for home.  She states the swelling improved after that.  She states that she has been going to the wound care clinic and they have had home health come out and change her una boots every couple of days.  She can't tell me if her wounds are improving or not and says she just doesn't know.    She states that she cannot lay flat for an extended period of time as she has a hiatal hernia.  She states that she does not have any strength in her legs and uses her arms for getting up to her walker.    She is taking a baby aspirin daily.  She does have diabetes and does take insulin and other oral agents for this.  She is also on a statin for her cholesterol.  Past Medical History  Diagnosis Date  . Anemia   . Diabetes mellitus   . CAD (coronary artery disease)   . Peripheral vascular disease   . Hypertension   . Cancer     colon; s/p colectomy 2007  . Anemia, iron deficiency 01/17/2012  . Hemorrhoids   . Nosebleed   . Stroke    Past Surgical History  Procedure Laterality Date  . Appendectomy    . Cholecystectomy    . Tonsilectomy, adenoidectomy, bilateral myringotomy and tubes    . Colon surgery      for colon CA   Allergies  Allergen Reactions  . Procaine Hcl     swelling  . Meloxicam     Shortness of breath   Current Outpatient Prescriptions  Medication Sig Dispense Refill  . amLODipine (NORVASC) 5 MG tablet Take 5 mg by mouth daily.      Marland Kitchen aspirin 81 MG tablet Take 81 mg by mouth daily.        Marland Kitchen atorvastatin (LIPITOR) 20 MG tablet daily.      . BD PEN NEEDLE NANO U/F 32G X 4 MM MISC As needed      . cholecalciferol (VITAMIN D) 1000 UNITS tablet Take 1,000 Units by mouth daily.      Marland Kitchen doxycycline (VIBRAMYCIN) 100 MG capsule       . FLUoxetine (PROZAC) 20 MG tablet Take 20 mg by mouth daily.      . furosemide (LASIX) 20 MG tablet Take 20 mg by mouth daily.      Marland Kitchen glipiZIDE (GLUCOTROL XL) 2.5 MG 24 hr tablet Take 2.5 mg by mouth daily.      . insulin glargine (LANTUS) 100 UNIT/ML injection Inject 36 Units into the skin at bedtime.       Marland Kitchen levothyroxine (SYNTHROID, LEVOTHROID) 125 MCG tablet Take 125 mcg by mouth. Synthroid ONlY      . losartan (COZAAR) 100 MG tablet Take 100 mg by mouth daily.      . metFORMIN (GLUMETZA) 1000 MG (MOD) 24 hr tablet Take 1,000 mg by mouth 2 (two) times daily with  a meal.       . PRODIGY NO CODING BLOOD GLUC test strip       . SANTYL ointment continuous as needed.      . clobetasol cream (TEMOVATE) 0.05 % Apply topically 2 (two) times daily.        No current facility-administered medications for this visit.   Family History  Problem Relation Age of Onset  . Cancer Mother   . Diabetes Mother   . Other Father     bleeding problems  . Diabetes Sister   . Heart disease Daughter     before age 43   History   Social History  . Marital Status: Divorced    Spouse Name: N/A    Number of Children: N/A  . Years of Education: N/A   Occupational History  . Not on file.   Social History Main Topics  . Smoking status: Former Smoker    Types: Cigarettes    Quit date: 03/13/1967  . Smokeless tobacco: Never Used  . Alcohol Use: No  . Drug Use: No  . Sexually Active: Not on file   Other Topics Concern  . Not on file   Social History Narrative  . No narrative on file   ROS: [x]  Positive   [ ]  Negative   [ ]  All sytems reviewed and are negative  Cardiovascular: []  chest pain/pressure []  palpitations []  SOB lying flat []  DOE [x]  pain in  legs while walking []  pain in feet when lying flat []  hx of DVT []  hx of phlebitis [x]  swelling in legs []  varicose veins  Pulmonary: []  productive cough []  asthma []  wheezing  Neurologic: [x]  weakness in []  arms [x]  legs []  numbness in []  arms []  legs [] difficulty speaking or slurred speech []  temporary loss of vision in one eye []  dizziness  Endocrine: [x]  diabetes [x]  thyroid disease (on synthroid)  Hematologic: []  bleeding problems []  problems with blood clotting easily  GI []  vomiting blood []  blood in stool [x]  hiatal hernia [x]  hx colon cancer with colectomy  GU: []  burning with urination []  blood in urine  Psychiatric: []  hx of major depression  Integumentary: [x]  rashes [x]  ulcers  Constitutional: []  fever []  chills  PHYSICAL EXAMINATION:  Filed Vitals:   08/27/12 1514  BP: 151/66  Pulse: 75  Resp: 18   Body mass index is 40.27 kg/(m^2).  General:  WDWN in NAD Gait: with walker HENT: WNL Eyes: PERRL Pulmonary: normal non-labored breathing , without Rales, rhonchi,  wheezing Cardiac: RRR, without  Murmurs, rubs or gallops; No carotid bruits Abdomen: soft, obese NT, no masses Skin: non healing ulcers to BLE Vascular Exam/Pulses: palpable bilateral femoral pulses; unable to palpate pedal pulses; bilateral palpable radial pulses Extremities: without ischemic changes, no Gangrene ,  cellulitis; + open wounds to BLE  Musculoskeletal: no muscle wasting or atrophy  Neurologic: A&O X 3; Appropriate Affect ; SENSATION: normal; MOTOR FUNCTION:  moving all extremities equally. Speech is fluent/normal  Non-Invasive Vascular Imaging:  ABI's 08/27/12 Right .51 Left .52  Creatinine on 07/10/2012 was 1.45.  ASSESSMENT/PLAN: 79 y.o. female with non healing ulcers and edema to BLE.    -her ABI's today do reveal that she has bilateral severe occlusive disease and she most likely has venous insufficiency as well. -pt is unable to lay flat to elevate  legs due to her hiatal hernia. -we will schedule her for an angiogram with bilateral leg runoff to determine if it is possible for intervention for  better healing of her BLE non healing ulcers. -we will schedule this for September 01, 2012. -of note, she does have carotid artery stenosis of the right ICA and her last duplex on 11/21/11 revealed a 40-59% stenosis and 1-39% stenosis on the left.  Doreatha Massed, PA-C [facility] [phone]  Clinic MD:  Pt seen and examined in conjunction with Dr. Edilia Bo  Agree with above. I have independently interpreted her arterial Doppler study today which showed a monophasic right dorsalis pedis signal. ABI on the right is 51%. On the left she has a monophasic dorsalis pedis signal with an ABI 52%. Posterior tibial signals could not be obtained on either side.  Her most recent carotid duplex scan was in September 2013 and she had a 40-59% right carotid stenosis with no significant stenosis on the left. She will be due for a follow up duplex scan in 3 months. I have ordered that.  I have reviewed her records from the wound care center. They have been putting Santyl ointment on the wound and also she has been on doxycycline. A culture on 07/30/2012 grew MRSA.  She has normal femoral pulses. Based on her exam and Doppler study, she has evidence of severe and infrainguinal arterial occlusive disease. Given the nonhealing wounds on both legs clearly this is a limb threatening situation. I recommended that we proceed with arteriography in order to determine if she has any options for revascularization. I have reviewed with the patient the indications for arteriography. In addition, I have reviewed the potential complications of arteriography including but not limited to: Bleeding, arterial injury, arterial thrombosis, dye action, renal insufficiency, or other unpredictable medical problems. I have explained to the patient that if we find disease amenable  to angioplasty we could potentially address this at the same time. I have discussed the potential complications of angioplasty and stenting, including but not limited to: Bleeding, arterial thrombosis, arterial injury, dissection, or the need for surgical intervention. If there is not an endovascular approach, she would be at very high risk for open surgery given her age, obesity, and multiple medical comorbidities. In the meantime she'll continue her dressing changes although I would not recommend using an Unna boot given the compression which might further compromise her arterial circulation. Likewise elevation will affect her arterial perfusion and likely cause significant rest pain. We will make further recommendations pending the results of her arteriogram.  Waverly Ferrari, MD, FACS [facility] [phone] 08/27/2012

## 2012-08-28 ENCOUNTER — Other Ambulatory Visit: Payer: Self-pay

## 2012-08-29 ENCOUNTER — Other Ambulatory Visit: Payer: Self-pay

## 2012-08-29 ENCOUNTER — Encounter (HOSPITAL_COMMUNITY): Payer: Self-pay | Admitting: Pharmacy Technician

## 2012-09-01 ENCOUNTER — Ambulatory Visit (HOSPITAL_COMMUNITY)
Admission: AD | Admit: 2012-09-01 | Discharge: 2012-09-01 | Disposition: A | Discharge status: Home / Self Care | Payer: Medicare Other | Source: Ambulatory Visit | Attending: Vascular Surgery | Admitting: Vascular Surgery

## 2012-09-01 ENCOUNTER — Other Ambulatory Visit: Payer: Self-pay | Admitting: *Deleted

## 2012-09-01 ENCOUNTER — Telehealth: Payer: Self-pay | Admitting: Vascular Surgery

## 2012-09-01 ENCOUNTER — Encounter (HOSPITAL_COMMUNITY)
Admission: AD | Disposition: A | Discharge status: Home / Self Care | Payer: Self-pay | Source: Ambulatory Visit | Attending: Vascular Surgery

## 2012-09-01 ENCOUNTER — Other Ambulatory Visit: Payer: Self-pay

## 2012-09-01 DIAGNOSIS — Z794 Long term (current) use of insulin: Secondary | ICD-10-CM | POA: Insufficient documentation

## 2012-09-01 DIAGNOSIS — I739 Peripheral vascular disease, unspecified: Secondary | ICD-10-CM

## 2012-09-01 DIAGNOSIS — Z7982 Long term (current) use of aspirin: Secondary | ICD-10-CM | POA: Insufficient documentation

## 2012-09-01 DIAGNOSIS — I7092 Chronic total occlusion of artery of the extremities: Secondary | ICD-10-CM | POA: Insufficient documentation

## 2012-09-01 DIAGNOSIS — Z8673 Personal history of transient ischemic attack (TIA), and cerebral infarction without residual deficits: Secondary | ICD-10-CM | POA: Insufficient documentation

## 2012-09-01 DIAGNOSIS — E119 Type 2 diabetes mellitus without complications: Secondary | ICD-10-CM | POA: Insufficient documentation

## 2012-09-01 DIAGNOSIS — K449 Diaphragmatic hernia without obstruction or gangrene: Secondary | ICD-10-CM | POA: Insufficient documentation

## 2012-09-01 DIAGNOSIS — I2510 Atherosclerotic heart disease of native coronary artery without angina pectoris: Secondary | ICD-10-CM | POA: Insufficient documentation

## 2012-09-01 DIAGNOSIS — E669 Obesity, unspecified: Secondary | ICD-10-CM | POA: Insufficient documentation

## 2012-09-01 DIAGNOSIS — L98499 Non-pressure chronic ulcer of skin of other sites with unspecified severity: Secondary | ICD-10-CM

## 2012-09-01 DIAGNOSIS — I6529 Occlusion and stenosis of unspecified carotid artery: Secondary | ICD-10-CM | POA: Insufficient documentation

## 2012-09-01 DIAGNOSIS — E079 Disorder of thyroid, unspecified: Secondary | ICD-10-CM | POA: Insufficient documentation

## 2012-09-01 DIAGNOSIS — I10 Essential (primary) hypertension: Secondary | ICD-10-CM | POA: Insufficient documentation

## 2012-09-01 DIAGNOSIS — D509 Iron deficiency anemia, unspecified: Secondary | ICD-10-CM | POA: Insufficient documentation

## 2012-09-01 DIAGNOSIS — I83893 Varicose veins of bilateral lower extremities with other complications: Secondary | ICD-10-CM

## 2012-09-01 DIAGNOSIS — Z6841 Body Mass Index (BMI) 40.0 and over, adult: Secondary | ICD-10-CM | POA: Insufficient documentation

## 2012-09-01 DIAGNOSIS — L97509 Non-pressure chronic ulcer of other part of unspecified foot with unspecified severity: Secondary | ICD-10-CM | POA: Insufficient documentation

## 2012-09-01 DIAGNOSIS — Z87891 Personal history of nicotine dependence: Secondary | ICD-10-CM | POA: Insufficient documentation

## 2012-09-01 DIAGNOSIS — Z79899 Other long term (current) drug therapy: Secondary | ICD-10-CM | POA: Insufficient documentation

## 2012-09-01 HISTORY — PX: LOWER EXTREMITY ANGIOGRAM: SHX5955

## 2012-09-01 HISTORY — PX: LOWER EXTREMITY ANGIOGRAM: SHX5508

## 2012-09-01 HISTORY — PX: AORTOGRAM: SHX6300

## 2012-09-01 HISTORY — PX: ABDOMINAL AORTAGRAM: SHX5454

## 2012-09-01 LAB — POCT I-STAT, CHEM 8
BUN: 32 mg/dL — ABNORMAL HIGH (ref 6–23)
Creatinine, Ser: 1.4 mg/dL — ABNORMAL HIGH (ref 0.50–1.10)
Glucose, Bld: 58 mg/dL — ABNORMAL LOW (ref 70–99)
Hemoglobin: 10.2 g/dL — ABNORMAL LOW (ref 12.0–15.0)
TCO2: 26 mmol/L (ref 0–100)

## 2012-09-01 SURGERY — ABDOMINAL AORTAGRAM
Anesthesia: LOCAL

## 2012-09-01 MED ORDER — HYDRALAZINE HCL 20 MG/ML IJ SOLN: 10.0000 mg | Freq: Four times a day (QID) | INTRAMUSCULAR | Status: DC | PRN

## 2012-09-01 MED ORDER — LIDOCAINE HCL (PF) 1 % IJ SOLN
INTRAMUSCULAR | Status: AC
  Filled 2012-09-01: qty 30

## 2012-09-01 MED ORDER — SODIUM CHLORIDE 0.9 % IV SOLN: 1.0000 mL/kg/h | INTRAVENOUS | Status: DC

## 2012-09-01 MED ORDER — MIDAZOLAM HCL 2 MG/2ML IJ SOLN
INTRAMUSCULAR | Status: AC
  Filled 2012-09-01: qty 2

## 2012-09-01 MED ORDER — ACETAMINOPHEN 325 MG PO TABS: 650.0000 mg | ORAL_TABLET | ORAL | Status: DC | PRN

## 2012-09-01 MED ORDER — FENTANYL CITRATE 0.05 MG/ML IJ SOLN
INTRAMUSCULAR | Status: AC
  Filled 2012-09-01: qty 2

## 2012-09-01 MED ORDER — ONDANSETRON HCL 4 MG/2ML IJ SOLN: 4.0000 mg | Freq: Four times a day (QID) | INTRAMUSCULAR | Status: DC | PRN

## 2012-09-01 MED ORDER — HEPARIN (PORCINE) IN NACL 2-0.9 UNIT/ML-% IJ SOLN
INTRAMUSCULAR | Status: AC
  Filled 2012-09-01: qty 1000

## 2012-09-01 MED ORDER — SODIUM CHLORIDE 0.9 % IV SOLN: INTRAVENOUS | Status: DC

## 2012-09-01 NOTE — H&P (View-Only) (Signed)
[facility] [place] HISTORY AND PHYSICAL   CC:  Bilateral leg ulcers  Griffin, John Joseph, MD  HPI: This is a 79 y.o. female  Who is seen in the wound care clinic for bilateral extremity non healing wounds.  She states that on January 24, 2012, she was trying to break in some new shoes and when she took her shoes off that day, she had blisters on her bilateral bunions.  She states that she went to the hospital March 08, 2012 where she received IV Abx and then po ABx for home.  She states the swelling improved after that.  She states that she has been going to the wound care clinic and they have had home health come out and change her una boots every couple of days.  She can't tell me if her wounds are improving or not and says she just doesn't know.    She states that she cannot lay flat for an extended period of time as she has a hiatal hernia.  She states that she does not have any strength in her legs and uses her arms for getting up to her walker.    She is taking a baby aspirin daily.  She does have diabetes and does take insulin and other oral agents for this.  She is also on a statin for her cholesterol.  Past Medical History  Diagnosis Date  . Anemia   . Diabetes mellitus   . CAD (coronary artery disease)   . Peripheral vascular disease   . Hypertension   . Cancer     colon; s/p colectomy 2007  . Anemia, iron deficiency 01/17/2012  . Hemorrhoids   . Nosebleed   . Stroke    Past Surgical History  Procedure Laterality Date  . Appendectomy    . Cholecystectomy    . Tonsilectomy, adenoidectomy, bilateral myringotomy and tubes    . Colon surgery      for colon CA   Allergies  Allergen Reactions  . Procaine Hcl     swelling  . Meloxicam     Shortness of breath   Current Outpatient Prescriptions  Medication Sig Dispense Refill  . amLODipine (NORVASC) 5 MG tablet Take 5 mg by mouth daily.      . aspirin 81 MG tablet Take 81 mg by mouth daily.        . atorvastatin (LIPITOR) 20 MG tablet daily.      . BD PEN NEEDLE NANO U/F 32G X 4 MM MISC As needed      . cholecalciferol (VITAMIN D) 1000 UNITS tablet Take 1,000 Units by mouth daily.      . doxycycline (VIBRAMYCIN) 100 MG capsule       . FLUoxetine (PROZAC) 20 MG tablet Take 20 mg by mouth daily.      . furosemide (LASIX) 20 MG tablet Take 20 mg by mouth daily.      . glipiZIDE (GLUCOTROL XL) 2.5 MG 24 hr tablet Take 2.5 mg by mouth daily.      . insulin glargine (LANTUS) 100 UNIT/ML injection Inject 36 Units into the skin at bedtime.       . levothyroxine (SYNTHROID, LEVOTHROID) 125 MCG tablet Take 125 mcg by mouth. Synthroid ONlY      . losartan (COZAAR) 100 MG tablet Take 100 mg by mouth daily.      . metFORMIN (GLUMETZA) 1000 MG (MOD) 24 hr tablet Take 1,000 mg by mouth 2 (two) times daily with   a meal.       . PRODIGY NO CODING BLOOD GLUC test strip       . SANTYL ointment continuous as needed.      . clobetasol cream (TEMOVATE) 0.05 % Apply topically 2 (two) times daily.        No current facility-administered medications for this visit.   Family History  Problem Relation Age of Onset  . Cancer Mother   . Diabetes Mother   . Other Father     bleeding problems  . Diabetes Sister   . Heart disease Daughter     before age 60   History   Social History  . Marital Status: Divorced    Spouse Name: N/A    Number of Children: N/A  . Years of Education: N/A   Occupational History  . Not on file.   Social History Main Topics  . Smoking status: Former Smoker    Types: Cigarettes    Quit date: 03/13/1967  . Smokeless tobacco: Never Used  . Alcohol Use: No  . Drug Use: No  . Sexually Active: Not on file   Other Topics Concern  . Not on file   Social History Narrative  . No narrative on file   ROS: [x] Positive   [ ] Negative   [ ] All sytems reviewed and are negative  Cardiovascular: [] chest pain/pressure [] palpitations [] SOB lying flat [] DOE [x] pain in  legs while walking [] pain in feet when lying flat [] hx of DVT [] hx of phlebitis [x] swelling in legs [] varicose veins  Pulmonary: [] productive cough [] asthma [] wheezing  Neurologic: [x] weakness in [] arms [x] legs [] numbness in [] arms [] legs []difficulty speaking or slurred speech [] temporary loss of vision in one eye [] dizziness  Endocrine: [x] diabetes [x] thyroid disease (on synthroid)  Hematologic: [] bleeding problems [] problems with blood clotting easily  GI [] vomiting blood [] blood in stool [x] hiatal hernia [x] hx colon cancer with colectomy  GU: [] burning with urination [] blood in urine  Psychiatric: [] hx of major depression  Integumentary: [x] rashes [x] ulcers  Constitutional: [] fever [] chills  PHYSICAL EXAMINATION:  Filed Vitals:   08/27/12 1514  BP: 151/66  Pulse: 75  Resp: 18   Body mass index is 40.27 kg/(m^2).  General:  WDWN in NAD Gait: with walker HENT: WNL Eyes: PERRL Pulmonary: normal non-labored breathing , without Rales, rhonchi,  wheezing Cardiac: RRR, without  Murmurs, rubs or gallops; No carotid bruits Abdomen: soft, obese NT, no masses Skin: non healing ulcers to BLE Vascular Exam/Pulses: palpable bilateral femoral pulses; unable to palpate pedal pulses; bilateral palpable radial pulses Extremities: without ischemic changes, no Gangrene ,  cellulitis; + open wounds to BLE  Musculoskeletal: no muscle wasting or atrophy  Neurologic: A&O X 3; Appropriate Affect ; SENSATION: normal; MOTOR FUNCTION:  moving all extremities equally. Speech is fluent/normal  Non-Invasive Vascular Imaging:  ABI's 08/27/12 Right .51 Left .52  Creatinine on 07/10/2012 was 1.45.  ASSESSMENT/PLAN: 79 y.o. female with non healing ulcers and edema to BLE.    -her ABI's today do reveal that she has bilateral severe occlusive disease and she most likely has venous insufficiency as well. -pt is unable to lay flat to elevate  legs due to her hiatal hernia. -we will schedule her for an angiogram with bilateral leg runoff to determine if it is possible for intervention for   better healing of her BLE non healing ulcers. -we will schedule this for September 01, 2012. -of note, she does have carotid artery stenosis of the right ICA and her last duplex on 11/21/11 revealed a 40-59% stenosis and 1-39% stenosis on the left.  Samantha Rhyne, PA-C [facility] [phone]  Clinic MD:  Pt seen and examined in conjunction with Dr. Dickson  Agree with above. I have independently interpreted her arterial Doppler study today which showed a monophasic right dorsalis pedis signal. ABI on the right is 51%. On the left she has a monophasic dorsalis pedis signal with an ABI 52%. Posterior tibial signals could not be obtained on either side.  Her most recent carotid duplex scan was in September 2013 and she had a 40-59% right carotid stenosis with no significant stenosis on the left. She will be due for a follow up duplex scan in 3 months. I have ordered that.  I have reviewed her records from the wound care center. They have been putting Santyl ointment on the wound and also she has been on doxycycline. A culture on 07/30/2012 grew MRSA.  She has normal femoral pulses. Based on her exam and Doppler study, she has evidence of severe and infrainguinal arterial occlusive disease. Given the nonhealing wounds on both legs clearly this is a limb threatening situation. I recommended that we proceed with arteriography in order to determine if she has any options for revascularization. I have reviewed with the patient the indications for arteriography. In addition, I have reviewed the potential complications of arteriography including but not limited to: Bleeding, arterial injury, arterial thrombosis, dye action, renal insufficiency, or other unpredictable medical problems. I have explained to the patient that if we find disease amenable  to angioplasty we could potentially address this at the same time. I have discussed the potential complications of angioplasty and stenting, including but not limited to: Bleeding, arterial thrombosis, arterial injury, dissection, or the need for surgical intervention. If there is not an endovascular approach, she would be at very high risk for open surgery given her age, obesity, and multiple medical comorbidities. In the meantime she'll continue her dressing changes although I would not recommend using an Unna boot given the compression which might further compromise her arterial circulation. Likewise elevation will affect her arterial perfusion and likely cause significant rest pain. We will make further recommendations pending the results of her arteriogram.  Christopher Dickson, MD, FACS Beeper [phone] 08/27/2012      

## 2012-09-01 NOTE — Telephone Encounter (Signed)
LVM re appt info - sent letter - kf

## 2012-09-01 NOTE — Op Note (Signed)
PATIENT: Sandra [name]   MRN: [id number] DOB: 1930-12-28    DATE OF PROCEDURE: 09/01/2012  INDICATIONS: [name] is a 79 y.o. female who had presented with bilateral lower extremity venous wounds and also evidence of bilateral infrainguinal arterial occlusive disease. She is brought in for arteriography.  PROCEDURE:  1. Ultrasound-guided access to the right common femoral artery 2. Aortogram with bilateral iliac arteriogram 3. Selective catheterization of the left external iliac artery with left lower extremity runoff 4. Retrograde right femoral arteriogram with right lower extremity runoff  SURGEON: Di Kindle. Edilia Bo, MD, FACS  ANESTHESIA: local with sedation   EBL: Minimal  TECHNIQUE: The patient was brought to the peripheral vascular lab and received 1 mg of Versed and 25 mcg of fentanyl. Both groins were prepped and draped in usual sterile fashion. After the skin was infiltrated with 1% lidocaine, and under ultrasound guidance, the right common femoral artery was cannulated and a guidewire introduced into the infrarenal aorta under fluoroscopic control. A 5 French sheath was introduced over the wire. A pigtail catheter was positioned at the L1 vertebral body a flush aortogram obtained. This catheter was exchanged for a crossover catheter which was positioned into left common iliac artery. An angled Glidewire was advanced into the external iliac artery and then the crossover catheter exchanged for an end hole catheter. Selective left external iliac arteriogram was obtained with left lower extremity runoff. This catheter was then removed in a retrograde right femoral arteriogram obtained to the right femoral sheath. At the completion of the procedure the patient was transferred to the holding area for removal of her sheath. No immediate complications were noted the  FINDINGS:  1. Single renal arteries bilaterally with no significant renal artery stenosis identified. No significant disease  of the infrarenal aorta, bilateral common iliac arteries, bilateral external iliac arteries and hypogastric arteries. 2. On the left side, the common femoral and deep femoral artery are patent. There is mild diffuse disease of the superficial femoral artery. The left popliteal artery is patent. There is anterior tibial runoff on the left. The peroneal and posterior tibial arteries are occluded. 3. On the right side, the common femoral and deep femoral artery patent. The superficial femoral artery was occluded proximally with reconstitution of the above-knee popliteal artery. There is single vessel runoff on the right via the anterior tibial artery.  Waverly Ferrari, MD, FACS [facility]  DATE OF DICTATION:   09/01/2012

## 2012-09-01 NOTE — Telephone Encounter (Signed)
Message copied by Margaretmary Eddy on Mon Sep 01, 2012  2:20 PM ------      Message from: Melene Plan      Created: Mon Sep 01, 2012 11:01 AM      Regarding: FW: charge                   ----- Message -----         From: Chuck Hint, MD         Sent: 09/01/2012  10:43 AM           To: Reuel Derby, Melene Plan, RN, #      Subject: charge                                                   PROCEDURE:       1. Ultrasound-guided access to the right common femoral artery      2. Aortogram with bilateral iliac arteriogram      3. Selective catheterization of the left external iliac artery with left lower extremity runoff      4. Retrograde right femoral arteriogram with right lower extremity runoff            SURGEON: Di Kindle. Edilia Bo, MD, FACS            She needs a follow up visit in approximately 3 weeks with vein mapping of her right greater saphenous vein at that time. Thank you.      CD ------

## 2012-09-01 NOTE — Interval H&P Note (Signed)
History and Physical Interval Note:  09/01/2012 9:42 AM  Sandra [name]  has presented today for surgery, with the diagnosis of PVD  The various methods of treatment have been discussed with the patient and family. After consideration of risks, benefits and other options for treatment, the patient has consented to  Procedure(s): ABDOMINAL AORTAGRAM (N/A) as a surgical intervention .  The patient's history has been reviewed, patient examined, no change in status, stable for surgery.  I have reviewed the patient's chart and labs.  Questions were answered to the patient's satisfaction.     Shaquila Sigman S

## 2012-09-03 ENCOUNTER — Encounter: Payer: Self-pay | Admitting: Plastic Surgery

## 2012-09-04 ENCOUNTER — Encounter: Payer: Self-pay | Admitting: Vascular Surgery

## 2012-09-10 ENCOUNTER — Encounter (HOSPITAL_BASED_OUTPATIENT_CLINIC_OR_DEPARTMENT_OTHER): Payer: Medicare Other | Attending: General Surgery

## 2012-09-10 DIAGNOSIS — I87309 Chronic venous hypertension (idiopathic) without complications of unspecified lower extremity: Secondary | ICD-10-CM | POA: Insufficient documentation

## 2012-09-10 DIAGNOSIS — I739 Peripheral vascular disease, unspecified: Secondary | ICD-10-CM | POA: Insufficient documentation

## 2012-09-10 DIAGNOSIS — E1169 Type 2 diabetes mellitus with other specified complication: Secondary | ICD-10-CM | POA: Insufficient documentation

## 2012-09-10 DIAGNOSIS — L97809 Non-pressure chronic ulcer of other part of unspecified lower leg with unspecified severity: Secondary | ICD-10-CM | POA: Insufficient documentation

## 2012-09-23 ENCOUNTER — Encounter: Payer: Self-pay | Admitting: Vascular Surgery

## 2012-09-24 ENCOUNTER — Encounter: Payer: Self-pay | Admitting: Vascular Surgery

## 2012-09-24 ENCOUNTER — Ambulatory Visit (INDEPENDENT_AMBULATORY_CARE_PROVIDER_SITE_OTHER): Payer: Medicare Other | Admitting: Vascular Surgery

## 2012-09-24 ENCOUNTER — Encounter (INDEPENDENT_AMBULATORY_CARE_PROVIDER_SITE_OTHER): Payer: Medicare Other | Admitting: *Deleted

## 2012-09-24 VITALS — BP 147/58 | HR 62 | Resp 16 | Ht 61.0 in | Wt 215.0 lb

## 2012-09-24 DIAGNOSIS — L97909 Non-pressure chronic ulcer of unspecified part of unspecified lower leg with unspecified severity: Secondary | ICD-10-CM

## 2012-09-24 DIAGNOSIS — I739 Peripheral vascular disease, unspecified: Secondary | ICD-10-CM

## 2012-09-24 DIAGNOSIS — M79609 Pain in unspecified limb: Secondary | ICD-10-CM

## 2012-09-24 DIAGNOSIS — Z01810 Encounter for preprocedural cardiovascular examination: Secondary | ICD-10-CM

## 2012-09-24 NOTE — Progress Notes (Signed)
[facility]  Patient name: Sandra [name] MRN: [id number] DOB: 1930/11/02 Sex: female  REASON FOR VISIT: all up of bilateral lower extremity venous ulcers.  HPI: Sandra [name] is a 79 y.o. female I been following with bilateral lower extremity venous ulcers. She underwent an arteriogram on 09/01/2012. On the right side, the superficial femoral artery was occluded proximally with reconstitution of the above-knee popliteal artery although there was some disease here. He had single vessel runoff via the anterior tibial artery on the right. On the right she would likely require a femoral to below knee popliteal artery bypass graft.  On the left side, there is mild diffuse disease of the superficial femoral artery. The popliteal artery is patent. There is anterior tibial runoff on the left. The peroneal and posterior tibial arteries on the left were occluded. Thus there were really no options on the left which would impact the circulation on the left.  He continues to have her dressing changes 3 times a week. He thinks that there has been some improvement. She has had continuous serous drainage however.   REVIEW OF SYSTEMS: Arly.Keller ] denotes positive finding; [  ] denotes negative finding  CARDIOVASCULAR:  [ ]  chest pain   [ ]  dyspnea on exertion    CONSTITUTIONAL:  [ ]  fever   [ ]  chills  PHYSICAL EXAM: Filed Vitals:   09/24/12 1538  BP: 147/58  Pulse: 62  Resp: 16  Height: 5\' 1"  (1.549 m)  Weight: 215 lb (97.523 kg)  SpO2: 96%   Body mass index is 40.64 kg/(m^2). GENERAL: The patient is a well-nourished female, in no acute distress. The vital signs are documented above. CARDIOVASCULAR: There is a regular rate and rhythm  PULMONARY: There is good air exchange bilaterally without wheezing or rales. She has superficial wounds on both legs. On the anterior right leg the wound measures 3 cm x 2.5 cm in maximum diameter. There is a wound on the more medial aspect of the  distal right leg which measures 3 cm x 1 cm. There were also superficial ulcerations on the left. She has a fungal rash in her right groin and also the left groin to  MEDICAL ISSUES: Given that she is a poor candidate for revascularization given her age and obesity I would favor continued aggressive wound care and only reserve attempted femoropopliteal bypass grafting on the right if the wound deteriorated. Based on her vein mapping she could potentially have an autogenous bypass on the right if necessary. However given her obesity she would be at very high risk for wound complications. In addition I would be reluctant to consider any surgery until the fungal infection in her right groin has improved. I have asked her to begin using nystatin cream to try to treat this fungal infection. I'll see her back in 2 months. She knows to call sooner if she has problems.  Kenyana Husak S [facility] [place] Beeper: [phone]

## 2012-10-15 ENCOUNTER — Encounter (HOSPITAL_BASED_OUTPATIENT_CLINIC_OR_DEPARTMENT_OTHER): Payer: Medicare Other | Attending: General Surgery

## 2012-10-15 DIAGNOSIS — I87309 Chronic venous hypertension (idiopathic) without complications of unspecified lower extremity: Secondary | ICD-10-CM | POA: Insufficient documentation

## 2012-10-15 DIAGNOSIS — I890 Lymphedema, not elsewhere classified: Secondary | ICD-10-CM | POA: Insufficient documentation

## 2012-10-15 DIAGNOSIS — L97809 Non-pressure chronic ulcer of other part of unspecified lower leg with unspecified severity: Secondary | ICD-10-CM | POA: Insufficient documentation

## 2012-10-15 DIAGNOSIS — E1169 Type 2 diabetes mellitus with other specified complication: Secondary | ICD-10-CM | POA: Insufficient documentation

## 2012-11-19 ENCOUNTER — Ambulatory Visit: Payer: Medicare Other | Admitting: Vascular Surgery

## 2012-11-19 ENCOUNTER — Other Ambulatory Visit: Payer: Medicare Other

## 2012-11-19 ENCOUNTER — Encounter (HOSPITAL_BASED_OUTPATIENT_CLINIC_OR_DEPARTMENT_OTHER): Payer: Medicare Other | Attending: General Surgery

## 2012-11-19 DIAGNOSIS — L97809 Non-pressure chronic ulcer of other part of unspecified lower leg with unspecified severity: Secondary | ICD-10-CM | POA: Insufficient documentation

## 2012-11-19 DIAGNOSIS — I872 Venous insufficiency (chronic) (peripheral): Secondary | ICD-10-CM | POA: Insufficient documentation

## 2012-11-19 LAB — GLUCOSE, CAPILLARY: Glucose-Capillary: 100 mg/dL — ABNORMAL HIGH (ref 70–99)

## 2012-11-20 ENCOUNTER — Other Ambulatory Visit: Payer: Medicare Other

## 2012-11-20 ENCOUNTER — Ambulatory Visit: Payer: Medicare Other | Admitting: Neurosurgery

## 2012-11-25 ENCOUNTER — Encounter: Payer: Self-pay | Admitting: Vascular Surgery

## 2012-11-26 ENCOUNTER — Ambulatory Visit: Payer: Medicare Other | Admitting: Vascular Surgery

## 2012-11-26 ENCOUNTER — Encounter: Payer: Self-pay | Admitting: Vascular Surgery

## 2012-11-26 ENCOUNTER — Ambulatory Visit (INDEPENDENT_AMBULATORY_CARE_PROVIDER_SITE_OTHER): Payer: Medicare Other | Admitting: Vascular Surgery

## 2012-11-26 ENCOUNTER — Other Ambulatory Visit (INDEPENDENT_AMBULATORY_CARE_PROVIDER_SITE_OTHER): Payer: Medicare Other | Admitting: *Deleted

## 2012-11-26 DIAGNOSIS — I7025 Atherosclerosis of native arteries of other extremities with ulceration: Secondary | ICD-10-CM | POA: Insufficient documentation

## 2012-11-26 DIAGNOSIS — I6529 Occlusion and stenosis of unspecified carotid artery: Secondary | ICD-10-CM

## 2012-11-26 DIAGNOSIS — I739 Peripheral vascular disease, unspecified: Secondary | ICD-10-CM

## 2012-11-26 NOTE — Progress Notes (Signed)
[facility]  Patient name: Sandra [name] MRN: [id number] DOB: 1930-11-29 Sex: female  REASON FOR VISIT: follow up of carotid disease and peripheral vascular disease.  HPI: Sandra [name] is a 79 y.o. female who I recently saw on 08/27/2012 with bilateral lower extremity wounds. At that time she had an ABI on the right and 51% and an ABI on the left of 52%. She has normal femoral pulses. He started her exam she had evidence of severe infrainguinal arterial occlusive disease. I set her up for an arteriogram. This was performed on 09/01/2012. This showed no evidence of aortoiliac occlusive disease. In the left lower extremity, she had mild diffuse disease of the superficial femoral artery. There was single vessel runoff via the anterior tibial artery on the left. On the right side, the superficial femoral artery was occluded proximally with reconstitution of the above-knee popliteal artery. There was single vessel runoff on the right via the anterior tibial artery.  I last saw her on 09/24/2012. I again felt that she would be at high risk for surgery given her age and obesity. However, if the wounds in the right leg progressed she could be considered for a right femoral to popliteal artery bypass. She had a fungal infection in her right groin and she was started on nystatin. She comes in for a follow up visit.  She is also due for a follow up carotid duplex scan. In September of 2013 she had a 40-59% right carotid stenosis with no significant stenosis on the left. Is any history of stroke, TIAs, expressive or receptive aphasia, or amaurosis fugax.  Past Medical History  Diagnosis Date  . Anemia   . Diabetes mellitus   . CAD (coronary artery disease)   . Peripheral vascular disease   . Hypertension   . Cancer     colon; s/p colectomy 2007  . Anemia, iron deficiency 01/17/2012  . Hemorrhoids   . Nosebleed   . Stroke   . Cellulitis 03/08/12   Family History  Problem  Relation Age of Onset  . Cancer Mother   . Diabetes Mother   . Other Father     bleeding problems  . Diabetes Sister   . Heart disease Daughter     before age 59   SOCIAL HISTORY: History  Substance Use Topics  . Smoking status: Former Smoker    Types: Cigarettes    Quit date: 03/13/1967  . Smokeless tobacco: Never Used  . Alcohol Use: No   Allergies  Allergen Reactions  . Blackberry [Rubus Fruticosus]     Can't take because of Cancer  . Meloxicam Shortness Of Breath  . Procaine Hcl Swelling   Current Outpatient Prescriptions  Medication Sig Dispense Refill  . amLODipine (NORVASC) 5 MG tablet Take 5 mg by mouth daily.      Marland Kitchen aspirin EC 81 MG tablet Take 81 mg by mouth every evening.      Marland Kitchen atorvastatin (LIPITOR) 20 MG tablet Take 20 mg by mouth daily.       . BD PEN NEEDLE NANO U/F 32G X 4 MM MISC As needed      . cholecalciferol (VITAMIN D) 1000 UNITS tablet Take 1,000 Units by mouth daily.      Marland Kitchen doxycycline (VIBRAMYCIN) 100 MG capsule Take 100 mg by mouth 2 (two) times daily.       Marland Kitchen FLUoxetine (PROZAC) 20 MG tablet Take 20 mg by mouth daily.      Marland Kitchen  furosemide (LASIX) 20 MG tablet Take 20 mg by mouth daily.      Marland Kitchen glipiZIDE (GLUCOTROL XL) 2.5 MG 24 hr tablet Take 2.5 mg by mouth daily.      . insulin glargine (LANTUS) 100 UNIT/ML injection Inject 36 Units into the skin at bedtime.       Marland Kitchen levothyroxine (SYNTHROID, LEVOTHROID) 125 MCG tablet Take 125 mcg by mouth. Synthroid ONlY      . losartan (COZAAR) 100 MG tablet Take 100 mg by mouth daily.      . metFORMIN (GLUMETZA) 1000 MG (MOD) 24 hr tablet Take 1,000 mg by mouth 2 (two) times daily with a meal.       . PRODIGY NO CODING BLOOD GLUC test strip       . SANTYL ointment Apply 1 application topically continuous as needed (to wound).        No current facility-administered medications for this visit.   REVIEW OF SYSTEMS: Arly.Keller ] denotes positive finding; [  ] denotes negative finding  CARDIOVASCULAR:  [ ]  chest pain   [  ] chest pressure   [ ]  palpitations   Arly.Keller ] orthopnea   [ ]  dyspnea on exertion   [ ]  claudication   [ ]  rest pain   [ ]  DVT   [ ]  phlebitis PULMONARY:   [ ]  productive cough   [ ]  asthma   [ ]  wheezing NEUROLOGIC:   [ ]  weakness  [ ]  paresthesias  [ ]  aphasia  [ ]  amaurosis  [ ]  dizziness HEMATOLOGIC:   [ ]  bleeding problems   [ ]  clotting disorders MUSCULOSKELETAL:  [ ]  joint pain   [ ]  joint swelling Arly.Keller ] leg swelling GASTROINTESTINAL: [ ]   blood in stool  [ ]   hematemesis GENITOURINARY:  [ ]   dysuria  [ ]   hematuria PSYCHIATRIC:  [ ]  history of major depression INTEGUMENTARY:  [ ]  rashes  [ ]  ulcers CONSTITUTIONAL:  [ ]  fever   [ ]  chills  PHYSICAL EXAM: Filed Vitals:   11/26/12 1453  BP: 162/47  Pulse: 74  Temp: 98.5 F (36.9 C)  TempSrc: Oral  Height: 5\' 1"  (1.549 m)  Weight: 224 lb 6.4 oz (101.787 kg)  SpO2: 95%   Body mass index is 42.42 kg/(m^2). GENERAL: The patient is a morbidly obese  female, in no acute distress. The vital signs are documented above. CARDIOVASCULAR: There is a regular rate and rhythm.  PULMONARY: There is good air exchange bilaterally without wheezing or rales. ABDOMEN: Soft and non-tender with normal pitched bowel sounds.  MUSCULOSKELETAL: There are no major deformities or cyanosis. NEUROLOGIC: No focal weakness or paresthesias are detected. SKIN: she has a superficial ulcer on the anterior lateral aspect of her right leg and some swelling with mild cellulitis. On the left leg there are no open ulcers but some healed small ulcers. The fungal rash in her right groin has resolved. PSYCHIATRIC: The patient has a normal affect.  DATA:  The results of her arteriogram are discussed above.  I have independently interpreted her carotid duplex scan which shows a 40-59% proximal right carotid stenosis and a less than 40% left carotid stenosis. Thus there has been no significant change over the last year.  She also had a vein mapping of the right greater  saphenous vein. Diameters appear to be a reasonable on the right.  MEDICAL ISSUES: She has shown some improvement in the wounds in both legs. On the right side she  would potentially be a candidate for a right femoral to popliteal artery bypass if the wounds on the right leg progressed. However she would clearly be at increased risk because of her age, obesity, and comorbidities. Therefore, as long as the wounds are gradually improving I would favor of not proceeding with a bypass unless the wound began to deteriorate. On the left side there really no options for revascularization would significantly impact the circulation on the left. I plan on seeing her back in 3 months and we'll follow her wounds closely. She knows to call sooner if she has problems.  With respect to her carotid disease, she'll be due for a follow up duplex scan in 1 year. She is asymptomatic. She knows to continue taking her aspirin.  DICKSON,CHRISTOPHER S [facility] [place] Beeper: [phone]

## 2012-11-27 NOTE — Addendum Note (Signed)
Addended by: Adria Dill L on: 11/27/2012 11:42 AM   Modules accepted: Orders

## 2012-12-10 ENCOUNTER — Encounter (HOSPITAL_BASED_OUTPATIENT_CLINIC_OR_DEPARTMENT_OTHER): Payer: Medicare Other | Attending: General Surgery

## 2012-12-10 DIAGNOSIS — I87319 Chronic venous hypertension (idiopathic) with ulcer of unspecified lower extremity: Secondary | ICD-10-CM | POA: Insufficient documentation

## 2012-12-10 DIAGNOSIS — L97909 Non-pressure chronic ulcer of unspecified part of unspecified lower leg with unspecified severity: Secondary | ICD-10-CM | POA: Insufficient documentation

## 2013-01-08 ENCOUNTER — Ambulatory Visit (HOSPITAL_BASED_OUTPATIENT_CLINIC_OR_DEPARTMENT_OTHER): Payer: Medicare Other | Admitting: Hematology & Oncology

## 2013-01-08 ENCOUNTER — Other Ambulatory Visit (HOSPITAL_BASED_OUTPATIENT_CLINIC_OR_DEPARTMENT_OTHER): Payer: Medicare Other | Admitting: Lab

## 2013-01-08 VITALS — BP 134/50 | HR 57 | Temp 98.2°F | Resp 14 | Ht 61.0 in | Wt 219.0 lb

## 2013-01-08 DIAGNOSIS — C189 Malignant neoplasm of colon, unspecified: Secondary | ICD-10-CM

## 2013-01-08 DIAGNOSIS — Z9189 Other specified personal risk factors, not elsewhere classified: Secondary | ICD-10-CM

## 2013-01-08 DIAGNOSIS — D509 Iron deficiency anemia, unspecified: Secondary | ICD-10-CM

## 2013-01-08 DIAGNOSIS — Z85038 Personal history of other malignant neoplasm of large intestine: Secondary | ICD-10-CM

## 2013-01-08 DIAGNOSIS — L97909 Non-pressure chronic ulcer of unspecified part of unspecified lower leg with unspecified severity: Secondary | ICD-10-CM

## 2013-01-08 DIAGNOSIS — E11622 Type 2 diabetes mellitus with other skin ulcer: Secondary | ICD-10-CM

## 2013-01-08 DIAGNOSIS — C787 Secondary malignant neoplasm of liver and intrahepatic bile duct: Secondary | ICD-10-CM

## 2013-01-08 LAB — CBC WITH DIFFERENTIAL (CANCER CENTER ONLY)
BASO#: 0.1 10*3/uL (ref 0.0–0.2)
EOS%: 4.1 % (ref 0.0–7.0)
HGB: 9.9 g/dL — ABNORMAL LOW (ref 11.6–15.9)
MCH: 28.6 pg (ref 26.0–34.0)
MCHC: 31.1 g/dL — ABNORMAL LOW (ref 32.0–36.0)
MONO%: 6.4 % (ref 0.0–13.0)
NEUT#: 6.7 10*3/uL — ABNORMAL HIGH (ref 1.5–6.5)

## 2013-01-08 LAB — CMP (CANCER CENTER ONLY)
ALT(SGPT): 12 U/L (ref 10–47)
BUN, Bld: 33 mg/dL — ABNORMAL HIGH (ref 7–22)
CO2: 26 mEq/L (ref 18–33)
Creat: 1.6 mg/dl — ABNORMAL HIGH (ref 0.6–1.2)
Glucose, Bld: 81 mg/dL (ref 73–118)
Total Bilirubin: 0.5 mg/dl (ref 0.20–1.60)

## 2013-01-08 NOTE — Progress Notes (Signed)
This office note has been dictated.

## 2013-01-09 LAB — IRON AND TIBC CHCC
%SAT: 15 % — ABNORMAL LOW (ref 21–57)
Iron: 42 ug/dL (ref 41–142)
TIBC: 286 ug/dL (ref 236–444)
UIBC: 244 ug/dL (ref 120–384)

## 2013-01-09 LAB — FERRITIN CHCC: Ferritin: 75 ng/ml (ref 9–269)

## 2013-01-09 NOTE — Progress Notes (Signed)
DIAGNOSIS:  Stage IIIB (T3 N1 M0) adenocarcinoma of the colon.  CURRENT THERAPY:  Observation.  INTERIM HISTORY:  Sandra [name] comes in for 49-month followup.  As always, she has been busy with health issues.  Again, her main problem is going to be her diabetes.  She has a lot of leg issues.  [facility].  She has her right leg wrapped right now.  She has chronic edema in her legs.  She has had no problems with her bowels or bladder.  There has been no cough.  When we last saw her, her CEA was 2.6.  She does have some peripheral vascular disease.  She apparently had some arterial studies.  These were done back in the summertime.  Again, I think that showed she does have some peripheral arterial disease.  She is not on any type of blood thinner outside of a low-dose aspirin.  She continues on all of her medications, which are quite a few.  She is getting ready for the holidays.  She is at an assisted living center.  She seems to enjoy it there.  PHYSICAL EXAMINATION:  General:  This is a fairly well-developed, well- nourished white female in no obvious distress.  Vital signs: Temperature of 98.2, pulse 57, respiratory rate 14, blood pressure 134/50. Weight is 219 pounds.  Head and Neck:  Shows a normocephalic, atraumatic skull.  There are no ocular or oral lesions.  She has no palpable cervical or supraclavicular lymph nodes.  Lungs:  Clear bilaterally.  Cardiac:  Regular rate and rhythm with a normal S1 and S2. There are no murmurs, rubs, or bruits.  Abdomen:  Soft.  She has good bowel sounds.  There is no fluid wave.  There is no palpable abdominal mass.  There is no palpable hepatosplenomegaly.  She has well-healed laparotomy scar.  Extremities:  Shows there is a wrap on her right lower leg.  She has brawny edema in her lower legs.  She has some stasis dermatitis changes in her lower legs.  No ulcerations are noted. Neurological:  Shows no focal neurological  deficits.  LABORATORY STUDIES:  White cell count is 9, hemoglobin 9.9, hematocrit 31.8, platelet count 242.  Sodium 140, potassium 4.6, BUN 33, creatinine 1.6.  IMPRESSION:  Ms. Pinette is an 79 year old white female with history of stage IIIB adenocarcinoma of the colon.  This was diagnosed up in I think [place].  She was treated down here.  She completed treatments back in I think 2008.  Again, I do not see any issues with recurrent colon cancer.  I suppose this always could be a problem.  I still believe that her prognosis will clearly be delineated by her diabetes and complications from this.  We will plan to get her back in another 6 months.  I do want to check her iron studies.  I did look at her blood smear.  There was some anisocytosis.  She had some microcytic red cells.  It is possible that there may be some iron deficiency.  I spent a good half hour with Ms. Bouldin today.  It is always interesting listening to her and finding out what has been going on with her.    ______________________________ Josph Macho, M.D. PRE/MEDQ  D:  01/08/2013  T:  01/09/2013  Job:  [id number]

## 2013-01-13 ENCOUNTER — Telehealth: Payer: Self-pay | Admitting: Nurse Practitioner

## 2013-01-13 LAB — CEA: CEA: 2.6 ng/mL (ref 0.0–5.0)

## 2013-01-13 NOTE — Telephone Encounter (Addendum)
Message copied by Glee Arvin on Tue Jan 13, 2013 10:31 AM ------      Message from: Josph Macho      Created: Sun Jan 11, 2013  8:01 PM       Call - iron is low.  Need 1 dose of Feraheme at 1020mg .  Please set up in 1-2 days.  Pete ------Pt verbalized understanding and appointment has been made for Nov. 13 @245 .

## 2013-01-22 ENCOUNTER — Ambulatory Visit (HOSPITAL_BASED_OUTPATIENT_CLINIC_OR_DEPARTMENT_OTHER): Payer: Medicare Other

## 2013-01-22 VITALS — BP 139/53 | HR 64 | Temp 96.8°F | Resp 16

## 2013-01-22 DIAGNOSIS — D509 Iron deficiency anemia, unspecified: Secondary | ICD-10-CM

## 2013-01-22 MED ORDER — SODIUM CHLORIDE 0.9 % IV SOLN
Freq: Once | INTRAVENOUS | Status: AC
  Administered 2013-01-22: 14:00:00 via INTRAVENOUS

## 2013-01-22 MED ORDER — FERUMOXYTOL INJECTION 510 MG/17 ML
1020.0000 mg | Freq: Once | INTRAVENOUS | Status: AC
  Administered 2013-01-22: 1020 mg via INTRAVENOUS
  Filled 2013-01-22: qty 34

## 2013-01-22 NOTE — Patient Instructions (Signed)

## 2013-02-11 ENCOUNTER — Encounter (HOSPITAL_BASED_OUTPATIENT_CLINIC_OR_DEPARTMENT_OTHER): Payer: Medicare Other | Attending: General Surgery

## 2013-02-11 DIAGNOSIS — L02419 Cutaneous abscess of limb, unspecified: Secondary | ICD-10-CM | POA: Insufficient documentation

## 2013-02-11 DIAGNOSIS — E669 Obesity, unspecified: Secondary | ICD-10-CM | POA: Insufficient documentation

## 2013-02-11 DIAGNOSIS — I999 Unspecified disorder of circulatory system: Secondary | ICD-10-CM | POA: Insufficient documentation

## 2013-02-11 DIAGNOSIS — E1169 Type 2 diabetes mellitus with other specified complication: Secondary | ICD-10-CM | POA: Insufficient documentation

## 2013-02-11 DIAGNOSIS — L97809 Non-pressure chronic ulcer of other part of unspecified lower leg with unspecified severity: Secondary | ICD-10-CM | POA: Insufficient documentation

## 2013-02-24 ENCOUNTER — Encounter: Payer: Self-pay | Admitting: Vascular Surgery

## 2013-02-25 ENCOUNTER — Encounter: Payer: Self-pay | Admitting: Vascular Surgery

## 2013-02-25 ENCOUNTER — Ambulatory Visit (INDEPENDENT_AMBULATORY_CARE_PROVIDER_SITE_OTHER): Payer: Medicare Other | Admitting: Vascular Surgery

## 2013-02-25 VITALS — BP 178/57 | HR 73 | Temp 98.3°F | Resp 18 | Ht 61.0 in | Wt 219.0 lb

## 2013-02-25 DIAGNOSIS — I739 Peripheral vascular disease, unspecified: Secondary | ICD-10-CM

## 2013-02-25 NOTE — Progress Notes (Signed)
   Patient name: Sandra [name] MRN: [id number] DOB: 1930/06/14 Sex: female  REASON FOR VISIT: Follow up of peripheral vascular disease with ulcer  HPI: Sandra [name] is a 79 y.o. female who I last saw on 11/26/2012. At that time, the wounds on both feet were in improving. We elected not to proceed with right femoropopliteal bypass grafting given that she would be at significantly increased risk for surgery because of her age, obesity, and comorbidities. On the left side she did not have any options for revascularization. She comes in for a 3 month follow up visit.  Since her last visit, she states that she has had swelling in both lower extremities. She is unable to elevate her legs significantly because of rest pain related to her peripheral vascular disease. Her wounds are followed in the wound care center and she states that they are making progress. Her activity is very limited.  REVIEW OF SYSTEMS: Arly.Keller ] denotes positive finding; [  ] denotes negative finding  CARDIOVASCULAR:  [ ]  chest pain   [ ]  dyspnea on exertion    CONSTITUTIONAL:  [ ]  fever   [ ]  chills  PHYSICAL EXAM: Filed Vitals:   02/25/13 1447  BP: 178/57  Pulse: 73  Temp: 98.3 F (36.8 C)  TempSrc: Oral  Resp: 18  Height: 5\' 1"  (1.549 m)  Weight: 219 lb (99.338 kg)  SpO2: 96%   Body mass index is 41.4 kg/(m^2). GENERAL: The patient is a well-nourished female, in no acute distress. The vital signs are documented above. CARDIOVASCULAR: There is a regular rate and rhythm. Because of her obesity, I am unable to palpate her femoral pulses. PULMONARY: There is good air exchange bilaterally without wheezing or rales. She has significant bilateral lower extremity swelling which is more significant on the right side. Her superficial ulcers in the right leg have improved. She has mild cellulitis of the right leg. She has significant bilateral lower extremity swelling.  I have again reviewed her arteriogram. On the right side if her  wounds did not heal she would require a right femoral to below knee popliteal artery bypass graft. She would be at significant risk for wound healing problems given her obesity and leg swelling on the right.  Of note, previous vein mapping does show that she appears to have adequate greater saphenous vein on the right side.  On the left side she has decent blood flow with no need for revascularization.  MEDICAL ISSUES:  Atherosclerosis of native arteries of the extremities with ulceration(440.23) Her wounds on the right leg appear to be gradually improving. As long as her wounds are improving, I would like to avoid revascularization. On the right side she would require a right femoral to below knee popliteal artery bypass graft which would be associated with significant risks of nonhealing given her obesity and right leg swelling with cellulitis. On the left side, based on her arteriogram, she should have adequate circulation to heal and he wounds on the left. There is no bypassable disease on the left. I will plan on seeing her back in 3 months. She knows to call sooner if she has problems.   DICKSON,CHRISTOPHER S [facility]: [phone]

## 2013-02-25 NOTE — Assessment & Plan Note (Signed)
Her wounds on the right leg appear to be gradually improving. As long as her wounds are improving, I would like to avoid revascularization. On the right side she would require a right femoral to below knee popliteal artery bypass graft which would be associated with significant risks of nonhealing given her obesity and right leg swelling with cellulitis. On the left side, based on her arteriogram, she should have adequate circulation to heal and he wounds on the left. There is no bypassable disease on the left. I will plan on seeing her back in 3 months. She knows to call sooner if she has problems.

## 2013-03-16 ENCOUNTER — Encounter (HOSPITAL_BASED_OUTPATIENT_CLINIC_OR_DEPARTMENT_OTHER): Payer: Medicare Other | Attending: General Surgery

## 2013-03-16 DIAGNOSIS — I872 Venous insufficiency (chronic) (peripheral): Secondary | ICD-10-CM | POA: Insufficient documentation

## 2013-03-16 DIAGNOSIS — L97809 Non-pressure chronic ulcer of other part of unspecified lower leg with unspecified severity: Secondary | ICD-10-CM | POA: Insufficient documentation

## 2013-03-16 DIAGNOSIS — L089 Local infection of the skin and subcutaneous tissue, unspecified: Secondary | ICD-10-CM | POA: Insufficient documentation

## 2013-03-16 DIAGNOSIS — I87339 Chronic venous hypertension (idiopathic) with ulcer and inflammation of unspecified lower extremity: Secondary | ICD-10-CM | POA: Insufficient documentation

## 2013-03-16 DIAGNOSIS — L97909 Non-pressure chronic ulcer of unspecified part of unspecified lower leg with unspecified severity: Principal | ICD-10-CM

## 2013-04-15 ENCOUNTER — Encounter (HOSPITAL_BASED_OUTPATIENT_CLINIC_OR_DEPARTMENT_OTHER): Payer: Medicare Other | Attending: General Surgery

## 2013-04-15 DIAGNOSIS — L03119 Cellulitis of unspecified part of limb: Secondary | ICD-10-CM

## 2013-04-15 DIAGNOSIS — L02419 Cutaneous abscess of limb, unspecified: Secondary | ICD-10-CM | POA: Insufficient documentation

## 2013-04-15 DIAGNOSIS — L97809 Non-pressure chronic ulcer of other part of unspecified lower leg with unspecified severity: Secondary | ICD-10-CM | POA: Insufficient documentation

## 2013-05-13 ENCOUNTER — Encounter (HOSPITAL_BASED_OUTPATIENT_CLINIC_OR_DEPARTMENT_OTHER): Payer: Medicare Other | Attending: General Surgery

## 2013-05-13 DIAGNOSIS — L089 Local infection of the skin and subcutaneous tissue, unspecified: Secondary | ICD-10-CM | POA: Insufficient documentation

## 2013-05-13 DIAGNOSIS — L97809 Non-pressure chronic ulcer of other part of unspecified lower leg with unspecified severity: Secondary | ICD-10-CM | POA: Insufficient documentation

## 2013-05-13 DIAGNOSIS — I739 Peripheral vascular disease, unspecified: Secondary | ICD-10-CM | POA: Insufficient documentation

## 2013-05-13 DIAGNOSIS — I872 Venous insufficiency (chronic) (peripheral): Secondary | ICD-10-CM | POA: Insufficient documentation

## 2013-06-10 ENCOUNTER — Ambulatory Visit: Payer: Medicare Other | Admitting: Vascular Surgery

## 2013-06-17 ENCOUNTER — Encounter (HOSPITAL_BASED_OUTPATIENT_CLINIC_OR_DEPARTMENT_OTHER): Payer: Medicare Other | Attending: General Surgery

## 2013-06-17 DIAGNOSIS — I872 Venous insufficiency (chronic) (peripheral): Secondary | ICD-10-CM | POA: Insufficient documentation

## 2013-06-17 DIAGNOSIS — I739 Peripheral vascular disease, unspecified: Secondary | ICD-10-CM | POA: Insufficient documentation

## 2013-07-01 ENCOUNTER — Ambulatory Visit: Payer: Medicare Other | Admitting: Vascular Surgery

## 2013-07-09 ENCOUNTER — Ambulatory Visit (HOSPITAL_BASED_OUTPATIENT_CLINIC_OR_DEPARTMENT_OTHER): Payer: Medicare Other | Admitting: Hematology & Oncology

## 2013-07-09 ENCOUNTER — Other Ambulatory Visit (HOSPITAL_BASED_OUTPATIENT_CLINIC_OR_DEPARTMENT_OTHER): Payer: Medicare Other | Admitting: Lab

## 2013-07-09 ENCOUNTER — Encounter: Payer: Self-pay | Admitting: Hematology & Oncology

## 2013-07-09 VITALS — BP 148/49 | HR 66 | Temp 98.0°F | Resp 16 | Ht 62.0 in | Wt 230.0 lb

## 2013-07-09 DIAGNOSIS — Z85038 Personal history of other malignant neoplasm of large intestine: Secondary | ICD-10-CM

## 2013-07-09 DIAGNOSIS — D509 Iron deficiency anemia, unspecified: Secondary | ICD-10-CM

## 2013-07-09 DIAGNOSIS — Z9189 Other specified personal risk factors, not elsewhere classified: Secondary | ICD-10-CM

## 2013-07-09 DIAGNOSIS — C787 Secondary malignant neoplasm of liver and intrahepatic bile duct: Secondary | ICD-10-CM

## 2013-07-09 DIAGNOSIS — C189 Malignant neoplasm of colon, unspecified: Secondary | ICD-10-CM

## 2013-07-09 LAB — CMP (CANCER CENTER ONLY)
ALBUMIN: 3.3 g/dL (ref 3.3–5.5)
ALT(SGPT): 21 U/L (ref 10–47)
AST: 20 U/L (ref 11–38)
Alkaline Phosphatase: 120 U/L — ABNORMAL HIGH (ref 26–84)
BUN: 34 mg/dL — AB (ref 7–22)
CALCIUM: 9.2 mg/dL (ref 8.0–10.3)
CHLORIDE: 103 meq/L (ref 98–108)
CO2: 31 meq/L (ref 18–33)
Creat: 1.4 mg/dl — ABNORMAL HIGH (ref 0.6–1.2)
Glucose, Bld: 183 mg/dL — ABNORMAL HIGH (ref 73–118)
POTASSIUM: 4.4 meq/L (ref 3.3–4.7)
Sodium: 145 mEq/L (ref 128–145)
Total Bilirubin: 0.6 mg/dl (ref 0.20–1.60)
Total Protein: 7 g/dL (ref 6.4–8.1)

## 2013-07-09 LAB — CBC WITH DIFFERENTIAL (CANCER CENTER ONLY)
BASO#: 0.1 10*3/uL (ref 0.0–0.2)
BASO%: 0.7 % (ref 0.0–2.0)
EOS ABS: 0.4 10*3/uL (ref 0.0–0.5)
EOS%: 4.5 % (ref 0.0–7.0)
HEMATOCRIT: 32.5 % — AB (ref 34.8–46.6)
HEMOGLOBIN: 10.2 g/dL — AB (ref 11.6–15.9)
LYMPH#: 1 10*3/uL (ref 0.9–3.3)
LYMPH%: 12.2 % — ABNORMAL LOW (ref 14.0–48.0)
MCH: 29.4 pg (ref 26.0–34.0)
MCHC: 31.4 g/dL — ABNORMAL LOW (ref 32.0–36.0)
MCV: 94 fL (ref 81–101)
MONO#: 0.7 10*3/uL (ref 0.1–0.9)
MONO%: 8 % (ref 0.0–13.0)
NEUT#: 6.1 10*3/uL (ref 1.5–6.5)
NEUT%: 74.6 % (ref 39.6–80.0)
Platelets: 171 10*3/uL (ref 145–400)
RBC: 3.47 10*6/uL — AB (ref 3.70–5.32)
RDW: 13.3 % (ref 11.1–15.7)
WBC: 8.2 10*3/uL (ref 3.9–10.0)

## 2013-07-09 LAB — CEA: CEA: 2.8 ng/mL (ref 0.0–5.0)

## 2013-07-09 NOTE — Progress Notes (Signed)
Hematology and Oncology Follow Up Visit  Sandra [name] [id number] October 13, 1930 80 y.o. 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  [name] is back for followup. Was here every 6 months. She has multiple other medical problems. She has bleeding hemorrhoids. She hasn't marked stasis dermatitis in her legs. She goes to the wound clinic for her legs. I suspect that she may be developing another infection.  She sees a kidney specialist in about a month. Sandra [name] is developing worsening kidney disease.  She's had no fever. She's had chronic cough. She's had chronic fatigue. She's had diarrhea. She's had no rashes.  Her last ferritin was only 75. Her saturation was 15%. We did give her IV iron back in November of 2014.  Her last CEA was 2.6 back in October of last year.  Medications: Current outpatient prescriptions:amLODipine (NORVASC) 5 MG tablet, Take 5 mg by mouth daily., Disp: , Rfl: ;  ANUCORT-HC 25 MG suppository, Place rectally as needed. , Disp: , Rfl: ;  aspirin EC 81 MG tablet, Take 81 mg by mouth every evening., Disp: , Rfl: ;  atorvastatin (LIPITOR) 20 MG tablet, , Disp: , Rfl: ;  cholecalciferol (VITAMIN D) 1000 UNITS tablet, Take 1,000 Units by mouth daily., Disp: , Rfl:  FLUoxetine (PROZAC) 20 MG tablet, Take 20 mg by mouth daily., Disp: , Rfl: ;  furosemide (LASIX) 20 MG tablet, Take 20 mg by mouth 2 (two) times daily. , Disp: , Rfl: ;  glipiZIDE (GLUCOTROL XL) 2.5 MG 24 hr tablet, Take 2.5 mg by mouth. 07-10-13 will be tapering off this med., Disp: , Rfl: ;  insulin glargine (LANTUS) 100 UNIT/ML injection, Inject 40 Units into the skin at bedtime. , Disp: , Rfl:  levothyroxine (SYNTHROID, LEVOTHROID) 125 MCG tablet, Take 125 mcg by mouth. Synthroid ONlY, Disp: , Rfl: ;  losartan (COZAAR) 100 MG tablet, Take 100 mg by mouth daily., Disp: , Rfl: ;  PRODIGY NO CODING BLOOD  GLUC test strip, , Disp: , Rfl:   Allergies:  Allergies  Allergen Reactions  . Blackberry [Rubus Fruticosus]     Can't take because of Cancer  . Meloxicam Shortness Of Breath  . Procaine Hcl Swelling    Past Medical History, Surgical history, Social history, and Family History were reviewed and updated.  Review of Systems: As above  Physical Exam:  height is 5\' 2"  (1.575 m) and weight is 230 lb (104.327 kg). Her oral temperature is 98 F (36.7 C). Her blood pressure is 148/49 and her pulse is 66. Her respiration is 16.   Morbidly obese white female. Lungs are decreased at the bases. Cardiac exam regular in rhythm. Is a 1/6 systolic murmur. Abdomen soft. She is obese. There is no fluid wave. There is a well-healed laparotomy scar. There is no palpable liver or spleen tip. Exam no tenderness over the spine. Extremities shows marked lymphedema of the legs bilaterally. She has erythema of the right leg over the left leg. She has a marked brawny edema of her legs. Neurological exam is nonfocal. Lab Results  Component Value Date   WBC 8.2 07/09/2013   HGB 10.2* 07/09/2013   HCT 32.5* 07/09/2013   MCV 94 07/09/2013   PLT 171 07/09/2013     Chemistry      Component Value Date/Time   NA 145 07/09/2013 1426   NA 138 09/01/2012  0912   K 4.4 07/09/2013 1426   K 4.5 09/01/2012 0912   CL 103 07/09/2013 1426   CL 105 09/01/2012 0912   CO2 31 07/09/2013 1426   CO2 27 07/10/2012 1443   BUN 34* 07/09/2013 1426   BUN 32* 09/01/2012 0912   CREATININE 1.4* 07/09/2013 1426   CREATININE 1.40* 09/01/2012 0912      Component Value Date/Time   CALCIUM 9.2 07/09/2013 1426   CALCIUM 9.1 07/10/2012 1443   ALKPHOS 120* 07/09/2013 1426   ALKPHOS 72 07/10/2012 1443   AST 20 07/09/2013 1426   AST 10 07/10/2012 1443   ALT 21 07/09/2013 1426   ALT 11 07/10/2012 1443   BILITOT 0.60 07/09/2013 1426   BILITOT 0.3 07/10/2012 1443         Impression and Plan: Sandra [name] is an 80 year old white female. She is history of stage  IIIB colon cancer. This was diagnosed when she was living up in [place]. She is now 7 years.  I really don't feel is most we can do for her at this point. I believe that she is cured. I just don't think we have to get her back to be seen.  She will be iron deficient on occasion. I think [place] for [place] to see her back would be for iron deficiency.  We will see her back in another 6 months. I suspect that she likely will need iron at that point.   Volanda Napoleon, MD 4/30/20153:57 PM

## 2013-07-14 ENCOUNTER — Encounter: Payer: Self-pay | Admitting: Vascular Surgery

## 2013-07-15 ENCOUNTER — Ambulatory Visit (INDEPENDENT_AMBULATORY_CARE_PROVIDER_SITE_OTHER): Payer: Medicare Other | Admitting: Vascular Surgery

## 2013-07-15 ENCOUNTER — Encounter: Payer: Self-pay | Admitting: Vascular Surgery

## 2013-07-15 VITALS — BP 152/35 | HR 55 | Ht 62.0 in | Wt 221.0 lb

## 2013-07-15 DIAGNOSIS — I739 Peripheral vascular disease, unspecified: Secondary | ICD-10-CM

## 2013-07-15 DIAGNOSIS — L98499 Non-pressure chronic ulcer of skin of other sites with unspecified severity: Principal | ICD-10-CM

## 2013-07-15 NOTE — Progress Notes (Signed)
    Established Intermittent Claudication  History of Present Illness  Sandra [name] is a 80 y.o. (May 27, 1930) female who presents with chief complaint: Bilateral lower extremity edema with healed ulcers.  Both feet and legs are well healed and we will not proceed with right femoropopliteal bypass.  The patient's symptoms have improved with the ulcers healing.  The patient's symptoms are: mod to sever edema.  The patient's treatment regimen currently included: maximal medical management and skin lubrication and diabetic shoes.    The patient's PMH, PSH, SH, FamHx, Med, and Allergies are unchanged from 02/25/2013.  On ROS today: no fever, chills, SOB, CP, N/V/D.    Physical Examination  Filed Vitals:   07/15/13 1441  BP: 152/35  Pulse: 55  Height: 5\' 2"  (1.575 m)  Weight: 221 lb (100.245 kg)  SpO2: 95%   Body mass index is 40.41 kg/(m^2).  General: A&O x 3, WD Pulmonary: Sym exp, good air movt, CTAB Cardiac: RRR  Vascular: Vessel Right Left  Radial Palpable Palpable  Brachial Palpable Palpable  Carotid Palpable, without bruit Palpable, without bruit  Aorta Not palpable N/A  Femoral notPalpable notPalpable  Popliteal Not palpable Not palpable  PT notPalpable notPalpable  DP notPalpable notPalpable   Gastrointestinal: soft, NTND Musculoskeletal: M/S 4+/5 throughout tExtremities without ischemic changes   Neurologic: Pain and light touch intact in extremities  except distal feet ( known peripheral Neuropathy), Motor exam as listed above  Medical Decision Making  Sandra [name] is a 80 y.o. female who presents with:  bilateral leg intermittent claudication without evidence of critical limb ischemia since her last visit all wounds have healed.  We will have her follow up in 9 months or sooner if she has problems with pain/ ulcers.    She was seen by Dr. Scot Dock as well who discussed elevation of her legs based on her tolerance.  Swelling when in a dependent position and  painful when elevated too long due to poor arterial supply.     [facility] Office: [phone]   07/15/2013, 3:22 PM  Agree with above. As her wounds appear to be essentially healed in the right lower extremity, I'll plan on seeing her back in 9 months. She knows to call sooner she has problems.  Deitra Mayo, MD, [place] [phone] 07/15/2013

## 2013-07-30 ENCOUNTER — Ambulatory Visit (INDEPENDENT_AMBULATORY_CARE_PROVIDER_SITE_OTHER): Payer: Medicare Other | Admitting: Cardiology

## 2013-07-30 ENCOUNTER — Encounter: Payer: Self-pay | Admitting: Cardiology

## 2013-07-30 VITALS — BP 118/68 | HR 79 | Ht 61.0 in | Wt 214.1 lb

## 2013-07-30 DIAGNOSIS — L98499 Non-pressure chronic ulcer of skin of other sites with unspecified severity: Secondary | ICD-10-CM

## 2013-07-30 DIAGNOSIS — I739 Peripheral vascular disease, unspecified: Secondary | ICD-10-CM

## 2013-07-30 NOTE — Patient Instructions (Signed)
Your physician recommends that you continue on your current medications as directed. Please refer to the Current Medication list given to you today.  Your physician wants you to follow-up in: 1 year with Dr. Skains. You will receive a reminder letter in the mail two months in advance. If you don't receive a letter, please call our office to schedule the follow-up appointment.  

## 2013-07-30 NOTE — Progress Notes (Signed)
[facility]. [address], [address], [place]  [address] Phone: [phone] Fax:  [phone]  Date:  07/30/2013   ID:  Sandra [name], Sandra [name] 1930/06/03, MRN [id number]  PCP:   Melinda Crutch, MD   History of Present Illness: Sandra [name] is a 80 y.o. female with diabetes, morbid obesity, chronic kidney disease, hypothyroidism, moderate mitral annular calcification seen on transesophageal echocardiogram in September of 2011 with mild mitral regurgitation here for followup. She's experienced no increase in shortness of breath, no chest pain.  She had significant disease of her right internal carotid artery. Previously, Dr. Scot Dock spoke to her about endarterectomy. She would like her son who has a Masters degree to be there for further discussion. I suggested that she go back to see him especially with her prior right eye embolic phenomenon.  Goes to wound center for both legs. she has not had any significant change in any of her symptoms.    Wt Readings from Last 3 Encounters:  07/30/13 214 lb 1.9 oz (97.124 kg)  07/15/13 221 lb (100.245 kg)  07/09/13 230 lb (104.327 kg)     Past Medical History  Diagnosis Date  . Anemia   . Diabetes mellitus   . CAD (coronary artery disease)   . Peripheral vascular disease   . Hypertension   . Cancer     colon; s/p colectomy 2007  . Anemia, iron deficiency 01/17/2012  . Hemorrhoids   . Nosebleed   . Stroke   . Cellulitis 03/08/12    Past Surgical History  Procedure Laterality Date  . Appendectomy    . Cholecystectomy    . Tonsilectomy, adenoidectomy, bilateral myringotomy and tubes    . Colon surgery      for colon CA  . Lower extremity angiogram  September 01, 2012  . Aortogram  September 01, 2012    Current Outpatient Prescriptions  Medication Sig Dispense Refill  . amLODipine (NORVASC) 5 MG tablet Take 5 mg by mouth daily.      Nicolette Bang 25 MG suppository Place rectally as needed.       Marland Kitchen aspirin EC 81 MG tablet Take 81 mg by mouth  every evening.      Marland Kitchen atorvastatin (LIPITOR) 20 MG tablet Take 20 mg by mouth daily.       . BD PEN NEEDLE NANO U/F 32G X 4 MM MISC       . cholecalciferol (VITAMIN D) 1000 UNITS tablet Take 1,000 Units by mouth daily.      Marland Kitchen FLUoxetine (PROZAC) 20 MG tablet Take 20 mg by mouth daily.      . furosemide (LASIX) 20 MG tablet Take 20 mg by mouth 2 (two) times daily.       Marland Kitchen glipiZIDE (GLUCOTROL XL) 2.5 MG 24 hr tablet Take 2.5 mg by mouth. 07-10-13 will be tapering off this med.      . insulin glargine (LANTUS) 100 UNIT/ML injection Inject 40 Units into the skin at bedtime.       Marland Kitchen levothyroxine (SYNTHROID, LEVOTHROID) 125 MCG tablet Take 125 mcg by mouth. Synthroid ONlY      . losartan (COZAAR) 100 MG tablet Take 100 mg by mouth daily.      Marland Kitchen PRODIGY NO CODING BLOOD GLUC test strip        No current facility-administered medications for this visit.    Allergies:    Allergies  Allergen Reactions  . Blackberry [Rubus Fruticosus]  Can't take because of Cancer  . Meloxicam Shortness Of Breath  . Procaine Hcl Swelling    Social History:  The patient  reports that she quit smoking about 46 years ago. Her smoking use included Cigarettes. She started smoking about 58 years ago. She has a 13 pack-year smoking history. She has never used smokeless tobacco. She reports that she does not drink alcohol or use illicit drugs.   Family History  Problem Relation Age of Onset  . Cancer Mother   . Diabetes Mother   . Other Father     bleeding problems  . Diabetes Sister   . Heart disease Daughter     before age 79  . Cancer Son     ROS:  Please see the history of present illness.   Denies any syncope, bleeding, orthopnea, PND   All other systems reviewed and negative.   PHYSICAL EXAM: VS:  BP 118/68  Pulse 79  Ht 5\' 1"  (1.549 m)  Wt 214 lb 1.9 oz (97.124 kg)  BMI 40.48 kg/m2 Well nourished, well developed, in no acute distress HEENT: normal, [place]/AT, EOMI Neck: no JVD, normal carotid  upstroke, no bruit Cardiac:  normal S1, S2; RRR; no murmur Lungs:  clear to auscultation bilaterally, no wheezing, rhonchi or rales Abd: soft, nontender, no hepatomegaly, no bruitsObese Ext: Weeping lower extremities right greater than left. Open sore on right. Skin: warm and dry GU: deferred Neuro: no focal abnormalities noted, AAO x 3  EKG:  07/30/13-sinus rhythm, 79, right bundle branch block, left anterior fascicular block, bifascicular block, first degree AV block, PR interval 304 ms   ASSESSMENT AND PLAN:  1. Peripheral vascular disease-quite significant. Right lower extremity wounds. Extensive wound care. Dr. Scot Dock. Possible bypass option in the future. 2. Mitral valve disorder-doing well. No changes. 3. Hypertension-well-controlled. 4. Morbid obesity-encourage weight loss. Contributing to her edema.  Signed, Candee Furbish, MD [facility]  07/30/2013 3:01 PM

## 2013-08-10 ENCOUNTER — Other Ambulatory Visit: Payer: Self-pay | Admitting: Cardiology

## 2013-08-26 ENCOUNTER — Encounter (HOSPITAL_COMMUNITY): Payer: Medicare Other

## 2013-08-26 ENCOUNTER — Ambulatory Visit: Payer: Medicare Other | Admitting: Vascular Surgery

## 2013-09-10 ENCOUNTER — Other Ambulatory Visit: Payer: Self-pay | Admitting: Nephrology

## 2013-09-10 DIAGNOSIS — N183 Chronic kidney disease, stage 3 unspecified: Secondary | ICD-10-CM

## 2013-09-14 ENCOUNTER — Ambulatory Visit
Admission: RE | Admit: 2013-09-14 | Discharge: 2013-09-14 | Disposition: A | Discharge status: Home / Self Care | Payer: Medicare Other | Source: Ambulatory Visit | Attending: Nephrology | Admitting: Nephrology

## 2013-09-14 DIAGNOSIS — N183 Chronic kidney disease, stage 3 unspecified: Secondary | ICD-10-CM

## 2013-09-17 ENCOUNTER — Other Ambulatory Visit: Payer: Medicare Other

## 2013-09-22 ENCOUNTER — Encounter: Payer: Self-pay | Admitting: Vascular Surgery

## 2013-09-23 ENCOUNTER — Ambulatory Visit (INDEPENDENT_AMBULATORY_CARE_PROVIDER_SITE_OTHER): Payer: Medicare Other | Admitting: Vascular Surgery

## 2013-09-23 ENCOUNTER — Encounter: Payer: Self-pay | Admitting: Vascular Surgery

## 2013-09-23 ENCOUNTER — Ambulatory Visit (HOSPITAL_COMMUNITY)
Admission: RE | Admit: 2013-09-23 | Discharge: 2013-09-23 | Disposition: A | Discharge status: Home / Self Care | Payer: Medicare Other | Source: Ambulatory Visit | Attending: Vascular Surgery | Admitting: Vascular Surgery

## 2013-09-23 VITALS — BP 158/58 | HR 86 | Temp 98.2°F | Resp 18 | Ht 61.0 in | Wt 201.0 lb

## 2013-09-23 DIAGNOSIS — I739 Peripheral vascular disease, unspecified: Secondary | ICD-10-CM | POA: Diagnosis present

## 2013-09-23 DIAGNOSIS — L98499 Non-pressure chronic ulcer of skin of other sites with unspecified severity: Secondary | ICD-10-CM | POA: Diagnosis present

## 2013-09-23 DIAGNOSIS — L97909 Non-pressure chronic ulcer of unspecified part of unspecified lower leg with unspecified severity: Secondary | ICD-10-CM

## 2013-09-23 NOTE — Progress Notes (Signed)
Patient ID: Sandra [name], female   DOB: Jul 11, 1930, 80 y.o.   MRN: [id number]  Reason for Consult: Re-evaluation   Referred by Melinda Crutch, MD  Subjective:     HPI:  Sandra [name] is a 80 y.o. female who I last saw on 02/25/2013. She has had wounds on both lower extremities. When I saw her back in December of 2014, I noted that she had reasonable flow on the left side. At that time she could potentially have a right femoropopliteal bypass graft but would be at significantly increased risk because of her age, obesity, and comorbidities. Vein mapping at that time suggested adequate greater saphenous vein on the right side. Her right leg was gradually improving and therefore we elected not to proceed with revascularization. At that time she would have required a right femoral to below knee popliteal artery bypass.  She was then seen back in May of this year and the wounds in the right lower extremity and almost completely healed. She was to come back for a 9 month follow up visit. However apparently her wounds have progressed. She comes in for further evaluation.  She was sent back for reevaluation as she has had some cellulitis in the right leg although currently has no open ulcers. I do not get any clear-cut history of claudication although her activity is fairly limited. She denies rest pain.  Past Medical History  Diagnosis Date  . Anemia   . Diabetes mellitus   . CAD (coronary artery disease)   . Peripheral vascular disease   . Hypertension   . Cancer     colon; s/p colectomy 2007  . Anemia, iron deficiency 01/17/2012  . Hemorrhoids   . Nosebleed   . Stroke   . Cellulitis 03/08/12   Family History  Problem Relation Age of Onset  . Cancer Mother   . Diabetes Mother   . Other Father     bleeding problems  . Diabetes Sister   . Heart disease Daughter     before age 44  . Cancer Son    Past Surgical History  Procedure Laterality Date  . Appendectomy    . Cholecystectomy      . Tonsilectomy, adenoidectomy, bilateral myringotomy and tubes    . Colon surgery      for colon CA  . Lower extremity angiogram  September 01, 2012  . Aortogram  September 01, 2012   Short Social History:  History  Substance Use Topics  . Smoking status: Former Smoker -- 1.00 packs/day for 13 years    Types: Cigarettes    Start date: 01/09/1955    Quit date: 03/13/1967  . Smokeless tobacco: Never Used     Comment: quit  45 years ago  . Alcohol Use: No   Allergies  Allergen Reactions  . Blackberry [Rubus Fruticosus]     Can't take because of Cancer  . Meloxicam Shortness Of Breath  . Procaine Hcl Swelling   Current Outpatient Prescriptions  Medication Sig Dispense Refill  . amLODipine (NORVASC) 5 MG tablet Take 5 mg by mouth daily.      Nicolette Bang 25 MG suppository Place rectally as needed.       Marland Kitchen aspirin EC 81 MG tablet Take 81 mg by mouth every evening.      . BD PEN NEEDLE NANO U/F 32G X 4 MM MISC       . cholecalciferol (VITAMIN D) 1000 UNITS tablet Take 1,000 Units by mouth daily.      Marland Kitchen  FLUoxetine (PROZAC) 20 MG tablet Take 20 mg by mouth daily.      . furosemide (LASIX) 20 MG tablet Take 20 mg by mouth 2 (two) times daily.       Marland Kitchen glipiZIDE (GLUCOTROL XL) 2.5 MG 24 hr tablet Take 2.5 mg by mouth. 07-10-13 will be tapering off this med.      . insulin glargine (LANTUS) 100 UNIT/ML injection Inject 40 Units into the skin at bedtime.       Marland Kitchen levothyroxine (SYNTHROID, LEVOTHROID) 125 MCG tablet Take 125 mcg by mouth. Synthroid ONlY      . losartan (COZAAR) 100 MG tablet Take 100 mg by mouth daily.      Marland Kitchen PRODIGY NO CODING BLOOD GLUC test strip       . atorvastatin (LIPITOR) 20 MG tablet TAKE 1 TABLET BY MOUTH ONCE DAILY.  90 tablet  1   No current facility-administered medications for this visit.   Review of Systems  Constitutional: Negative for chills and fever.  Eyes: Negative for loss of vision.  Respiratory: Negative for cough and wheezing.  Cardiovascular: Positive for  leg swelling. Negative for chest pain, chest tightness, claudication, dyspnea with exertion, orthopnea and palpitations.  GI: Negative for blood in stool and vomiting.  GU: Negative for dysuria and hematuria.  Musculoskeletal: Negative for leg pain, joint pain and myalgias.  Skin: Positive for rash. Negative for wound.  Neurological: Negative for dizziness and speech difficulty.  Hematologic: Positive for bruises/bleeds easily.  Psychiatric: Negative for depressed mood.        Objective:  Objective  Filed Vitals:   09/23/13 1602  BP: 158/58  Pulse: 86  Temp: 98.2 F (36.8 C)  TempSrc: Oral  Resp: 18  Height: 5\' 1"  (1.549 m)  Weight: 201 lb (91.173 kg)  SpO2: 97%   Body mass index is 38 kg/(m^2).  Physical Exam  Constitutional: She is oriented to person, place, and time. She appears well-developed and well-nourished.  HENT:  Head: Normocephalic and atraumatic.  Neck: Neck supple. No JVD present. No thyromegaly present.  Cardiovascular: Normal rate, regular rhythm and normal heart sounds.  Exam reveals no friction rub.   No murmur heard. Pulses:      Femoral pulses are 2+ on the right side, and 2+ on the left side.      Dorsalis pedis pulses are 0 on the right side, and 0 on the left side.       Posterior tibial pulses are 0 on the right side, and 0 on the left side.  Pulmonary/Chest: Breath sounds normal. She has no wheezes. She has no rales.  Abdominal: Soft. Bowel sounds are normal. There is no tenderness.  Musculoskeletal: Normal range of motion. She exhibits no edema.  Lymphadenopathy:    She has no cervical adenopathy.  Neurological: She is alert and oriented to person, place, and time. She has normal strength. No sensory deficit.  Skin: No lesion and no rash noted. There is erythema.  She has cellulitis of her right leg with no open ulcers.  Psychiatric: She has a normal mood and affect.   Data: ARTERIAL DOPPLER STUDY: I have independently interpreted her  arterial Doppler study which shows an ABI of 63% on the right with monophasic Doppler signals in the dorsalis pedis and posterior tibial positions. On the left side, she has an ABI of 64% with monophasic Doppler signals in the dorsalis pedis and posterior tibial positions.  I have reviewed her records from Dr. Alan Ripper office. Her  hypertension has been over controlled in her medications were adjusted. She was started on cephalexin for the cellulitis of her right leg and was set up for a visit with [place].      Assessment/Plan:     PVD (peripheral vascular disease) This patient does have persistent chronic venous insufficiency of the right lower extremity with cellulitis but currently has no open ulcers. Her arteriogram was over a year ago at that time she was a candidate for a femoral to below knee popliteal artery bypass grafting. However, clearly she would be at increased risk for surgery given her age, obesity, and multiple comorbidities. I have offered her the option of considering bypass as she states that she simply tired with dealing with these chronic wounds. I think we would need to repeat her arteriogram is has been over a year. In addition she would need preoperative cardiac clearance by Dr. Marlou Porch. I have again explained to her that certainly she would be at increased risk if we do proceed with surgery. Unfortunately she did not have an endovascular options. He will discuss this with her son who lives in [place]. If she elects to proceed with arteriography we can schedule this and then schedule her for preoperative cardiac clearance. If she wishes to continue conservative treatment I'll plan on seeing her back in 6 months.      Angelia Mould MD [facility]

## 2013-09-23 NOTE — Assessment & Plan Note (Signed)
This patient does have persistent chronic venous insufficiency of the right lower extremity with cellulitis but currently has no open ulcers. Her arteriogram was over a year ago at that time she was a candidate for a femoral to below knee popliteal artery bypass grafting. However, clearly she would be at increased risk for surgery given her age, obesity, and multiple comorbidities. I have offered her the option of considering bypass as she states that she simply tired with dealing with these chronic wounds. I think we would need to repeat her arteriogram is has been over a year. In addition she would need preoperative cardiac clearance by Dr. Marlou Porch. I have again explained to her that certainly she would be at increased risk if we do proceed with surgery. Unfortunately she did not have an endovascular options. He will discuss this with her son who lives in [place]. If she elects to proceed with arteriography we can schedule this and then schedule her for preoperative cardiac clearance. If she wishes to continue conservative treatment I'll plan on seeing her back in 6 months.

## 2013-09-29 ENCOUNTER — Ambulatory Visit (INDEPENDENT_AMBULATORY_CARE_PROVIDER_SITE_OTHER): Payer: Medicare Other | Admitting: Podiatry

## 2013-09-29 ENCOUNTER — Encounter: Payer: Self-pay | Admitting: Podiatry

## 2013-09-29 VITALS — BP 144/65 | HR 82 | Resp 16

## 2013-09-29 DIAGNOSIS — M79609 Pain in unspecified limb: Secondary | ICD-10-CM

## 2013-09-29 DIAGNOSIS — M79676 Pain in unspecified toe(s): Secondary | ICD-10-CM

## 2013-09-29 DIAGNOSIS — B351 Tinea unguium: Secondary | ICD-10-CM

## 2013-09-29 NOTE — Progress Notes (Signed)
She presents today chief complaint of painful elongated toenails.  Objective: Nails are thick yellow dystrophic with mycotic and painful palpation. Pulses are minimally palpable I lateral. She has a history of peripheral vascular disease and is being treated by vascular surgeons.  Assessment: Pain in limb secondary to onychomycosis 1 through 5 bilateral.  Plan: Debridement of nails 1 through 5 bilateral covered service secondary to pain

## 2013-09-29 NOTE — Patient Instructions (Signed)
Diabetes and Foot Care Diabetes may cause you to have problems because of poor blood supply (circulation) to your feet and legs. This may cause the skin on your feet to become thinner, break easier, and heal more slowly. Your skin may become dry, and the skin may peel and crack. You may also have nerve damage in your legs and feet causing decreased feeling in them. You may not notice minor injuries to your feet that could lead to infections or more serious problems. Taking care of your feet is one of the most important things you can do for yourself.  HOME CARE INSTRUCTIONS  Wear shoes at all times, even in the house. Do not go barefoot. Bare feet are easily injured.  Check your feet daily for blisters, cuts, and redness. If you cannot see the bottom of your feet, use a mirror or ask someone for help.  Wash your feet with warm water (do not use hot water) and mild soap. Then pat your feet and the areas between your toes until they are completely dry. Do not soak your feet as this can dry your skin.  Apply a moisturizing lotion or petroleum jelly (that does not contain alcohol and is unscented) to the skin on your feet and to dry, brittle toenails. Do not apply lotion between your toes.  Trim your toenails straight across. Do not dig under them or around the cuticle. File the edges of your nails with an emery board or nail file.  Do not cut corns or calluses or try to remove them with medicine.  Wear clean socks or stockings every day. Make sure they are not too tight. Do not wear knee-high stockings since they may decrease blood flow to your legs.  Wear shoes that fit properly and have enough cushioning. To break in new shoes, wear them for just a few hours a day. This prevents you from injuring your feet. Always look in your shoes before you put them on to be sure there are no objects inside.  Do not cross your legs. This may decrease the blood flow to your feet.  If you find a minor scrape,  cut, or break in the skin on your feet, keep it and the skin around it clean and dry. These areas may be cleansed with mild soap and water. Do not cleanse the area with peroxide, alcohol, or iodine.  When you remove an adhesive bandage, be sure not to damage the skin around it.  If you have a wound, look at it several times a day to make sure it is healing.  Do not use heating pads or hot water bottles. They may burn your skin. If you have lost feeling in your feet or legs, you may not know it is happening until it is too late.  Make sure your health care provider performs a complete foot exam at least annually or more often if you have foot problems. Report any cuts, sores, or bruises to your health care provider immediately. SEEK MEDICAL CARE IF:   You have an injury that is not healing.  You have cuts or breaks in the skin.  You have an ingrown nail.  You notice redness on your legs or feet.  You feel burning or tingling in your legs or feet.  You have pain or cramps in your legs and feet.  Your legs or feet are numb.  Your feet always feel cold. SEEK IMMEDIATE MEDICAL CARE IF:   There is increasing redness,   swelling, or pain in or around a wound.  There is a red line that goes up your leg.  Pus is coming from a wound.  You develop a fever or as directed by your health care provider.  You notice a bad smell coming from an ulcer or wound. Document Released: 02/24/2000 Document Revised: 10/29/2012 Document Reviewed: 08/05/2012 Exit[employer] Patient Information 2015 [employer]. This information is not intended to replace advice given to you by your health care provider. Make sure you discuss any questions you have with your health care provider.  

## 2013-10-16 ENCOUNTER — Encounter (HOSPITAL_BASED_OUTPATIENT_CLINIC_OR_DEPARTMENT_OTHER): Payer: Medicare Other | Attending: General Surgery

## 2013-10-16 DIAGNOSIS — I059 Rheumatic mitral valve disease, unspecified: Secondary | ICD-10-CM | POA: Insufficient documentation

## 2013-10-16 DIAGNOSIS — Z8673 Personal history of transient ischemic attack (TIA), and cerebral infarction without residual deficits: Secondary | ICD-10-CM | POA: Diagnosis not present

## 2013-10-16 DIAGNOSIS — Z794 Long term (current) use of insulin: Secondary | ICD-10-CM | POA: Diagnosis not present

## 2013-10-16 DIAGNOSIS — Z7982 Long term (current) use of aspirin: Secondary | ICD-10-CM | POA: Insufficient documentation

## 2013-10-16 DIAGNOSIS — E1169 Type 2 diabetes mellitus with other specified complication: Secondary | ICD-10-CM | POA: Diagnosis not present

## 2013-10-16 DIAGNOSIS — L97909 Non-pressure chronic ulcer of unspecified part of unspecified lower leg with unspecified severity: Secondary | ICD-10-CM | POA: Diagnosis present

## 2013-10-16 DIAGNOSIS — I10 Essential (primary) hypertension: Secondary | ICD-10-CM | POA: Insufficient documentation

## 2013-10-16 DIAGNOSIS — I999 Unspecified disorder of circulatory system: Secondary | ICD-10-CM | POA: Diagnosis not present

## 2013-10-16 NOTE — Progress Notes (Signed)
[facility]  NAME:  Sandra [name], Sandra [name]                  ACCOUNT NO.:  [id number]  MEDICAL RECORD NO.:  [id number]      DATE OF BIRTH:  1930/09/06  PHYSICIAN:  Judene Companion, M.D.           VISIT DATE:                                  OFFICE VISIT   This is an obese 80 year old who has been coming here for years.  She has type 2 diabetes.  She has mitral valve prolapse.  She has had a stroke.  She has hypertension.  She is on many medicines including Norvasc, Lantus insulin, aspirin, Synthroid, glipizide, and some statin drug.  When she came here today, she had a blood pressure of 140/60, a pulse of 76, temperature 98.  She says she weighs 204 pounds, but she looks much heavier than that.  She comes here because she has had real problems with ischemic legs.  Her legs always had ischemic skin, no palpable pulses and chronic ischemic ulcers bilaterally.  She has been seen many times by Dr. Kellie Simmering and he has done a thorough workup and felt that surgery was risky, but that he would go along with a femoral- popliteal bypass graft if she felt like her situation was such that she had to have it done.  At the present time, she is leaning towards doing this bypass graft.  She saw Dr. Deitra Mayo just a month ago and they discussed a great length going through with the femoral-popliteal bypass graft.  Today, she has got some superficial ulcers with some cellulitis on the right leg.  The skin is warm, but very tight.  I of course do not feel any pulses.  We are going to treat her today with wrapping her in a Kerlix and putting on silver alginate on the wounds and we are planning to follow her after her procedure is done by Dr. Scot Dock.     Judene Companion, M.D.     PP/MEDQ  D:  10/16/2013  T:  10/16/2013  Job:  [id number]

## 2013-10-23 DIAGNOSIS — I059 Rheumatic mitral valve disease, unspecified: Secondary | ICD-10-CM | POA: Diagnosis not present

## 2013-10-23 DIAGNOSIS — E1169 Type 2 diabetes mellitus with other specified complication: Secondary | ICD-10-CM | POA: Diagnosis not present

## 2013-10-23 DIAGNOSIS — L97909 Non-pressure chronic ulcer of unspecified part of unspecified lower leg with unspecified severity: Secondary | ICD-10-CM | POA: Diagnosis not present

## 2013-10-23 DIAGNOSIS — I999 Unspecified disorder of circulatory system: Secondary | ICD-10-CM | POA: Diagnosis not present

## 2013-10-28 DIAGNOSIS — I999 Unspecified disorder of circulatory system: Secondary | ICD-10-CM | POA: Diagnosis not present

## 2013-10-28 DIAGNOSIS — L97909 Non-pressure chronic ulcer of unspecified part of unspecified lower leg with unspecified severity: Secondary | ICD-10-CM | POA: Diagnosis not present

## 2013-10-28 DIAGNOSIS — E1169 Type 2 diabetes mellitus with other specified complication: Secondary | ICD-10-CM | POA: Diagnosis not present

## 2013-10-28 DIAGNOSIS — I059 Rheumatic mitral valve disease, unspecified: Secondary | ICD-10-CM | POA: Diagnosis not present

## 2013-10-30 ENCOUNTER — Ambulatory Visit (INDEPENDENT_AMBULATORY_CARE_PROVIDER_SITE_OTHER): Payer: Medicare Other | Admitting: *Deleted

## 2013-10-30 DIAGNOSIS — I739 Peripheral vascular disease, unspecified: Secondary | ICD-10-CM

## 2013-10-30 DIAGNOSIS — L98499 Non-pressure chronic ulcer of skin of other sites with unspecified severity: Secondary | ICD-10-CM

## 2013-10-31 LAB — LIPID PANEL
CHOLESTEROL: 109 mg/dL (ref 0–200)
HDL: 50.3 mg/dL (ref >39.00)
LDL Cholesterol: 51 mg/dL (ref 0–99)
NonHDL: 58.7
TRIGLYCERIDES: 38 mg/dL (ref 0.0–149.0)
Total CHOL/HDL Ratio: 2
VLDL: 7.6 mg/dL (ref 0.0–40.0)

## 2013-10-31 LAB — ALT: ALT: 18 U/L (ref 0–35)

## 2013-11-04 DIAGNOSIS — L97909 Non-pressure chronic ulcer of unspecified part of unspecified lower leg with unspecified severity: Secondary | ICD-10-CM | POA: Diagnosis not present

## 2013-11-04 DIAGNOSIS — I059 Rheumatic mitral valve disease, unspecified: Secondary | ICD-10-CM | POA: Diagnosis not present

## 2013-11-04 DIAGNOSIS — E1169 Type 2 diabetes mellitus with other specified complication: Secondary | ICD-10-CM | POA: Diagnosis not present

## 2013-11-04 DIAGNOSIS — I999 Unspecified disorder of circulatory system: Secondary | ICD-10-CM | POA: Diagnosis not present

## 2013-11-06 ENCOUNTER — Telehealth: Payer: Self-pay | Admitting: Cardiology

## 2013-11-06 NOTE — Telephone Encounter (Signed)
New message    Copy of test results.

## 2013-11-06 NOTE — Telephone Encounter (Signed)
Left message for pt to call back to discuss what results she is wanting a copy of - requested she call back Monday

## 2013-11-09 NOTE — Telephone Encounter (Signed)
Spoke with pt about results - she would like a copy of her results mailed to her home address.

## 2013-11-10 ENCOUNTER — Encounter: Payer: Self-pay | Admitting: *Deleted

## 2013-11-30 ENCOUNTER — Other Ambulatory Visit (HOSPITAL_COMMUNITY): Payer: Medicare Other

## 2013-11-30 ENCOUNTER — Ambulatory Visit: Payer: Medicare Other | Admitting: Family

## 2013-12-01 ENCOUNTER — Other Ambulatory Visit (HOSPITAL_COMMUNITY): Payer: Medicare Other

## 2014-01-01 ENCOUNTER — Encounter (HOSPITAL_BASED_OUTPATIENT_CLINIC_OR_DEPARTMENT_OTHER): Payer: Medicare Other | Attending: General Surgery

## 2014-01-01 DIAGNOSIS — L97929 Non-pressure chronic ulcer of unspecified part of left lower leg with unspecified severity: Secondary | ICD-10-CM | POA: Diagnosis not present

## 2014-01-01 DIAGNOSIS — I739 Peripheral vascular disease, unspecified: Secondary | ICD-10-CM | POA: Diagnosis not present

## 2014-01-01 DIAGNOSIS — L97919 Non-pressure chronic ulcer of unspecified part of right lower leg with unspecified severity: Secondary | ICD-10-CM | POA: Diagnosis not present

## 2014-01-01 DIAGNOSIS — I872 Venous insufficiency (chronic) (peripheral): Secondary | ICD-10-CM | POA: Insufficient documentation

## 2014-01-07 ENCOUNTER — Ambulatory Visit (HOSPITAL_BASED_OUTPATIENT_CLINIC_OR_DEPARTMENT_OTHER): Payer: Medicare Other | Admitting: Hematology & Oncology

## 2014-01-07 ENCOUNTER — Other Ambulatory Visit (HOSPITAL_BASED_OUTPATIENT_CLINIC_OR_DEPARTMENT_OTHER): Payer: Medicare Other | Admitting: Lab

## 2014-01-07 ENCOUNTER — Encounter (HOSPITAL_BASED_OUTPATIENT_CLINIC_OR_DEPARTMENT_OTHER): Payer: Self-pay | Admitting: Emergency Medicine

## 2014-01-07 ENCOUNTER — Other Ambulatory Visit: Payer: Self-pay

## 2014-01-07 ENCOUNTER — Inpatient Hospital Stay (HOSPITAL_BASED_OUTPATIENT_CLINIC_OR_DEPARTMENT_OTHER)
Admission: EM | Admit: 2014-01-07 | Discharge: 2014-01-14 | DRG: 603 | Disposition: A | Discharge status: Home Health | Payer: Medicare Other | Attending: Internal Medicine | Admitting: Internal Medicine

## 2014-01-07 ENCOUNTER — Encounter: Payer: Self-pay | Admitting: Hematology & Oncology

## 2014-01-07 VITALS — BP 154/34 | HR 82 | Temp 97.7°F | Resp 20

## 2014-01-07 DIAGNOSIS — E11621 Type 2 diabetes mellitus with foot ulcer: Secondary | ICD-10-CM | POA: Diagnosis present

## 2014-01-07 DIAGNOSIS — L03115 Cellulitis of right lower limb: Secondary | ICD-10-CM | POA: Diagnosis not present

## 2014-01-07 DIAGNOSIS — I70209 Unspecified atherosclerosis of native arteries of extremities, unspecified extremity: Secondary | ICD-10-CM | POA: Insufficient documentation

## 2014-01-07 DIAGNOSIS — L97519 Non-pressure chronic ulcer of other part of right foot with unspecified severity: Secondary | ICD-10-CM | POA: Diagnosis present

## 2014-01-07 DIAGNOSIS — M19019 Primary osteoarthritis, unspecified shoulder: Secondary | ICD-10-CM | POA: Diagnosis present

## 2014-01-07 DIAGNOSIS — I739 Peripheral vascular disease, unspecified: Secondary | ICD-10-CM | POA: Diagnosis present

## 2014-01-07 DIAGNOSIS — D509 Iron deficiency anemia, unspecified: Secondary | ICD-10-CM

## 2014-01-07 DIAGNOSIS — E11628 Type 2 diabetes mellitus with other skin complications: Secondary | ICD-10-CM | POA: Insufficient documentation

## 2014-01-07 DIAGNOSIS — Z794 Long term (current) use of insulin: Secondary | ICD-10-CM

## 2014-01-07 DIAGNOSIS — I7025 Atherosclerosis of native arteries of other extremities with ulceration: Secondary | ICD-10-CM | POA: Diagnosis present

## 2014-01-07 DIAGNOSIS — E039 Hypothyroidism, unspecified: Secondary | ICD-10-CM | POA: Diagnosis present

## 2014-01-07 DIAGNOSIS — F329 Major depressive disorder, single episode, unspecified: Secondary | ICD-10-CM | POA: Diagnosis present

## 2014-01-07 DIAGNOSIS — E669 Obesity, unspecified: Secondary | ICD-10-CM | POA: Diagnosis present

## 2014-01-07 DIAGNOSIS — F32A Depression, unspecified: Secondary | ICD-10-CM | POA: Diagnosis present

## 2014-01-07 DIAGNOSIS — Z9049 Acquired absence of other specified parts of digestive tract: Secondary | ICD-10-CM

## 2014-01-07 DIAGNOSIS — Z85038 Personal history of other malignant neoplasm of large intestine: Secondary | ICD-10-CM

## 2014-01-07 DIAGNOSIS — Z7982 Long term (current) use of aspirin: Secondary | ICD-10-CM

## 2014-01-07 DIAGNOSIS — Z6836 Body mass index (BMI) 36.0-36.9, adult: Secondary | ICD-10-CM

## 2014-01-07 DIAGNOSIS — E11649 Type 2 diabetes mellitus with hypoglycemia without coma: Secondary | ICD-10-CM | POA: Diagnosis present

## 2014-01-07 DIAGNOSIS — E1122 Type 2 diabetes mellitus with diabetic chronic kidney disease: Secondary | ICD-10-CM

## 2014-01-07 DIAGNOSIS — Z809 Family history of malignant neoplasm, unspecified: Secondary | ICD-10-CM

## 2014-01-07 DIAGNOSIS — E11622 Type 2 diabetes mellitus with other skin ulcer: Secondary | ICD-10-CM

## 2014-01-07 DIAGNOSIS — M25519 Pain in unspecified shoulder: Secondary | ICD-10-CM | POA: Insufficient documentation

## 2014-01-07 DIAGNOSIS — L089 Local infection of the skin and subcutaneous tissue, unspecified: Secondary | ICD-10-CM

## 2014-01-07 DIAGNOSIS — E875 Hyperkalemia: Secondary | ICD-10-CM | POA: Diagnosis present

## 2014-01-07 DIAGNOSIS — Z87891 Personal history of nicotine dependence: Secondary | ICD-10-CM

## 2014-01-07 DIAGNOSIS — I129 Hypertensive chronic kidney disease with stage 1 through stage 4 chronic kidney disease, or unspecified chronic kidney disease: Secondary | ICD-10-CM | POA: Diagnosis present

## 2014-01-07 DIAGNOSIS — Z8673 Personal history of transient ischemic attack (TIA), and cerebral infarction without residual deficits: Secondary | ICD-10-CM

## 2014-01-07 DIAGNOSIS — I452 Bifascicular block: Secondary | ICD-10-CM | POA: Diagnosis present

## 2014-01-07 DIAGNOSIS — M25512 Pain in left shoulder: Secondary | ICD-10-CM | POA: Diagnosis present

## 2014-01-07 DIAGNOSIS — L97509 Non-pressure chronic ulcer of other part of unspecified foot with unspecified severity: Secondary | ICD-10-CM

## 2014-01-07 DIAGNOSIS — E118 Type 2 diabetes mellitus with unspecified complications: Secondary | ICD-10-CM

## 2014-01-07 DIAGNOSIS — Z833 Family history of diabetes mellitus: Secondary | ICD-10-CM

## 2014-01-07 DIAGNOSIS — N179 Acute kidney failure, unspecified: Secondary | ICD-10-CM | POA: Diagnosis present

## 2014-01-07 DIAGNOSIS — E1129 Type 2 diabetes mellitus with other diabetic kidney complication: Secondary | ICD-10-CM | POA: Diagnosis present

## 2014-01-07 DIAGNOSIS — Z888 Allergy status to other drugs, medicaments and biological substances status: Secondary | ICD-10-CM

## 2014-01-07 DIAGNOSIS — L97919 Non-pressure chronic ulcer of unspecified part of right lower leg with unspecified severity: Secondary | ICD-10-CM

## 2014-01-07 DIAGNOSIS — N183 Chronic kidney disease, stage 3 (moderate): Secondary | ICD-10-CM | POA: Diagnosis present

## 2014-01-07 DIAGNOSIS — L98499 Non-pressure chronic ulcer of skin of other sites with unspecified severity: Secondary | ICD-10-CM | POA: Diagnosis present

## 2014-01-07 DIAGNOSIS — E1169 Type 2 diabetes mellitus with other specified complication: Secondary | ICD-10-CM | POA: Diagnosis present

## 2014-01-07 DIAGNOSIS — L89312 Pressure ulcer of right buttock, stage 2: Secondary | ICD-10-CM | POA: Diagnosis present

## 2014-01-07 DIAGNOSIS — I2510 Atherosclerotic heart disease of native coronary artery without angina pectoris: Secondary | ICD-10-CM | POA: Diagnosis present

## 2014-01-07 DIAGNOSIS — M879 Osteonecrosis, unspecified: Secondary | ICD-10-CM | POA: Diagnosis present

## 2014-01-07 DIAGNOSIS — I10 Essential (primary) hypertension: Secondary | ICD-10-CM | POA: Diagnosis present

## 2014-01-07 LAB — BASIC METABOLIC PANEL
ANION GAP: 13 (ref 5–15)
BUN: 56 mg/dL — ABNORMAL HIGH (ref 6–23)
CALCIUM: 9.4 mg/dL (ref 8.4–10.5)
CO2: 25 mEq/L (ref 19–32)
Chloride: 99 mEq/L (ref 96–112)
Creatinine, Ser: 1.6 mg/dL — ABNORMAL HIGH (ref 0.50–1.10)
GFR calc Af Amer: 34 mL/min — ABNORMAL LOW (ref >90)
GFR, EST NON AFRICAN AMERICAN: 29 mL/min — AB (ref >90)
Glucose, Bld: 184 mg/dL — ABNORMAL HIGH (ref 70–99)
Potassium: 5.7 mEq/L — ABNORMAL HIGH (ref 3.7–5.3)
SODIUM: 137 meq/L (ref 137–147)

## 2014-01-07 LAB — CBC WITH DIFFERENTIAL/PLATELET
BASOS ABS: 0 10*3/uL (ref 0.0–0.1)
Basophils Relative: 0 % (ref 0–1)
EOS PCT: 5 % (ref 0–5)
Eosinophils Absolute: 0.5 10*3/uL (ref 0.0–0.7)
HEMATOCRIT: 32.2 % — AB (ref 36.0–46.0)
Hemoglobin: 10.3 g/dL — ABNORMAL LOW (ref 12.0–15.0)
Lymphocytes Relative: 11 % — ABNORMAL LOW (ref 12–46)
Lymphs Abs: 1.2 10*3/uL (ref 0.7–4.0)
MCH: 28.9 pg (ref 26.0–34.0)
MCHC: 32 g/dL (ref 30.0–36.0)
MCV: 90.4 fL (ref 78.0–100.0)
MONO ABS: 0.7 10*3/uL (ref 0.1–1.0)
Monocytes Relative: 7 % (ref 3–12)
Neutro Abs: 8.2 10*3/uL — ABNORMAL HIGH (ref 1.7–7.7)
Neutrophils Relative %: 77 % (ref 43–77)
PLATELETS: 250 10*3/uL (ref 150–400)
RBC: 3.56 MIL/uL — ABNORMAL LOW (ref 3.87–5.11)
RDW: 13.3 % (ref 11.5–15.5)
WBC: 10.7 10*3/uL — AB (ref 4.0–10.5)

## 2014-01-07 LAB — CBC
HCT: 30.7 % — ABNORMAL LOW (ref 36.0–46.0)
Hemoglobin: 10.2 g/dL — ABNORMAL LOW (ref 12.0–15.0)
MCH: 28.9 pg (ref 26.0–34.0)
MCHC: 33.2 g/dL (ref 30.0–36.0)
MCV: 87 fL (ref 78.0–100.0)
PLATELETS: 248 10*3/uL (ref 150–400)
RBC: 3.53 MIL/uL — ABNORMAL LOW (ref 3.87–5.11)
RDW: 13.4 % (ref 11.5–15.5)
WBC: 8.3 10*3/uL (ref 4.0–10.5)

## 2014-01-07 LAB — CBC WITH DIFFERENTIAL (CANCER CENTER ONLY)
BASO#: 0.1 10*3/uL (ref 0.0–0.2)
BASO%: 0.5 % (ref 0.0–2.0)
EOS%: 4.3 % (ref 0.0–7.0)
Eosinophils Absolute: 0.5 10*3/uL (ref 0.0–0.5)
HCT: 32.5 % — ABNORMAL LOW (ref 34.8–46.6)
HEMOGLOBIN: 10.5 g/dL — AB (ref 11.6–15.9)
LYMPH#: 1 10*3/uL (ref 0.9–3.3)
LYMPH%: 9.9 % — AB (ref 14.0–48.0)
MCH: 29.2 pg (ref 26.0–34.0)
MCHC: 32.3 g/dL (ref 32.0–36.0)
MCV: 91 fL (ref 81–101)
MONO#: 0.7 10*3/uL (ref 0.1–0.9)
MONO%: 6.7 % (ref 0.0–13.0)
NEUT#: 8.2 10*3/uL — ABNORMAL HIGH (ref 1.5–6.5)
NEUT%: 78.6 % (ref 39.6–80.0)
PLATELETS: 249 10*3/uL (ref 145–400)
RBC: 3.59 10*6/uL — AB (ref 3.70–5.32)
RDW: 13.2 % (ref 11.1–15.7)
WBC: 10.5 10*3/uL — AB (ref 3.9–10.0)

## 2014-01-07 LAB — GLUCOSE, CAPILLARY
Glucose-Capillary: 126 mg/dL — ABNORMAL HIGH (ref 70–99)
Glucose-Capillary: 258 mg/dL — ABNORMAL HIGH (ref 70–99)

## 2014-01-07 LAB — CREATININE, SERUM
Creatinine, Ser: 1.4 mg/dL — ABNORMAL HIGH (ref 0.50–1.10)
GFR, EST AFRICAN AMERICAN: 40 mL/min — AB (ref >90)
GFR, EST NON AFRICAN AMERICAN: 34 mL/min — AB (ref >90)

## 2014-01-07 LAB — MRSA PCR SCREENING: MRSA BY PCR: NEGATIVE

## 2014-01-07 LAB — I-STAT CG4 LACTIC ACID, ED: LACTIC ACID, VENOUS: 0.73 mmol/L (ref 0.5–2.2)

## 2014-01-07 MED ORDER — VANCOMYCIN HCL 10 G IV SOLR
1250.0000 mg | Freq: Once | INTRAVENOUS | Status: AC
  Administered 2014-01-07: 1250 mg via INTRAVENOUS
  Filled 2014-01-07: qty 1250

## 2014-01-07 MED ORDER — HEPARIN SODIUM (PORCINE) 5000 UNIT/ML IJ SOLN
5000.0000 [IU] | Freq: Three times a day (TID) | INTRAMUSCULAR | Status: DC
  Administered 2014-01-07 – 2014-01-14 (×21): 5000 [IU] via SUBCUTANEOUS
  Filled 2014-01-07 (×23): qty 1

## 2014-01-07 MED ORDER — PIPERACILLIN-TAZOBACTAM 3.375 G IVPB
3.3750 g | Freq: Three times a day (TID) | INTRAVENOUS | Status: DC
  Filled 2014-01-07 (×2): qty 50

## 2014-01-07 MED ORDER — SODIUM CHLORIDE 0.9 % IV SOLN
INTRAVENOUS | Status: DC
  Administered 2014-01-07: 21:00:00 via INTRAVENOUS
  Administered 2014-01-09: 1000 mL via INTRAVENOUS
  Administered 2014-01-11 – 2014-01-13 (×2): via INTRAVENOUS

## 2014-01-07 MED ORDER — VANCOMYCIN HCL IN DEXTROSE 1-5 GM/200ML-% IV SOLN
1000.0000 mg | Freq: Once | INTRAVENOUS | Status: DC
  Filled 2014-01-07: qty 200

## 2014-01-07 MED ORDER — FUROSEMIDE 20 MG PO TABS
20.0000 mg | ORAL_TABLET | Freq: Two times a day (BID) | ORAL | Status: DC
Start: 2014-01-08 — End: 2014-01-14
  Administered 2014-01-08 – 2014-01-14 (×14): 20 mg via ORAL
  Filled 2014-01-07 (×17): qty 1

## 2014-01-07 MED ORDER — SODIUM CHLORIDE 0.9 % IV SOLN: INTRAVENOUS | Status: DC

## 2014-01-07 MED ORDER — SODIUM POLYSTYRENE SULFONATE 15 GM/60ML PO SUSP
30.0000 g | Freq: Once | ORAL | Status: AC
  Administered 2014-01-07: 30 g via ORAL
  Filled 2014-01-07: qty 120

## 2014-01-07 MED ORDER — FENTANYL CITRATE 0.05 MG/ML IJ SOLN
50.0000 ug | Freq: Once | INTRAMUSCULAR | Status: AC
  Administered 2014-01-07: 50 ug via INTRAVENOUS
  Filled 2014-01-07: qty 2

## 2014-01-07 MED ORDER — PIPERACILLIN-TAZOBACTAM 3.375 G IVPB
3.3750 g | Freq: Three times a day (TID) | INTRAVENOUS | Status: DC
  Administered 2014-01-07 – 2014-01-08 (×3): 3.375 g via INTRAVENOUS
  Filled 2014-01-07 (×5): qty 50

## 2014-01-07 MED ORDER — SODIUM CHLORIDE 0.9 % IV BOLUS (SEPSIS)
1000.0000 mL | Freq: Once | INTRAVENOUS | Status: AC
  Administered 2014-01-07: 1000 mL via INTRAVENOUS

## 2014-01-07 MED ORDER — SODIUM CHLORIDE 0.9 % IJ SOLN
3.0000 mL | Freq: Two times a day (BID) | INTRAMUSCULAR | Status: DC
  Administered 2014-01-11 – 2014-01-12 (×3): 3 mL via INTRAVENOUS

## 2014-01-07 MED ORDER — HYDROMORPHONE HCL 1 MG/ML IJ SOLN
0.5000 mg | INTRAMUSCULAR | Status: DC | PRN
  Administered 2014-01-08 – 2014-01-14 (×14): 1 mg via INTRAVENOUS
  Filled 2014-01-07 (×14): qty 1

## 2014-01-07 MED ORDER — AMLODIPINE BESYLATE 5 MG PO TABS
5.0000 mg | ORAL_TABLET | Freq: Every day | ORAL | Status: DC
  Administered 2014-01-07 – 2014-01-11 (×5): 5 mg via ORAL
  Filled 2014-01-07 (×6): qty 1

## 2014-01-07 MED ORDER — FLUOXETINE HCL 20 MG PO TABS
20.0000 mg | ORAL_TABLET | Freq: Every day | ORAL | Status: DC
  Administered 2014-01-07 – 2014-01-14 (×8): 20 mg via ORAL
  Filled 2014-01-07 (×8): qty 1

## 2014-01-07 MED ORDER — INSULIN GLARGINE 100 UNIT/ML ~~LOC~~ SOLN
40.0000 [IU] | Freq: Every day | SUBCUTANEOUS | Status: DC
  Administered 2014-01-07 – 2014-01-13 (×7): 40 [IU] via SUBCUTANEOUS
  Filled 2014-01-07 (×8): qty 0.4

## 2014-01-07 MED ORDER — ASPIRIN EC 81 MG PO TBEC
81.0000 mg | DELAYED_RELEASE_TABLET | Freq: Every evening | ORAL | Status: DC
  Administered 2014-01-08 – 2014-01-14 (×7): 81 mg via ORAL
  Filled 2014-01-07 (×8): qty 1

## 2014-01-07 MED ORDER — INSULIN ASPART 100 UNIT/ML ~~LOC~~ SOLN
0.0000 [IU] | SUBCUTANEOUS | Status: DC
  Administered 2014-01-08: 1 [IU] via SUBCUTANEOUS
  Administered 2014-01-08 – 2014-01-09 (×2): 2 [IU] via SUBCUTANEOUS

## 2014-01-07 MED ORDER — LEVOTHYROXINE SODIUM 125 MCG PO TABS
125.0000 ug | ORAL_TABLET | Freq: Every day | ORAL | Status: DC
  Administered 2014-01-08 – 2014-01-14 (×7): 125 ug via ORAL
  Filled 2014-01-07 (×8): qty 1

## 2014-01-07 MED ORDER — PIPERACILLIN-TAZOBACTAM 3.375 G IVPB 30 MIN
3.3750 g | Freq: Once | INTRAVENOUS | Status: DC
  Filled 2014-01-07: qty 50

## 2014-01-07 MED ORDER — VANCOMYCIN HCL IN DEXTROSE 750-5 MG/150ML-% IV SOLN
750.0000 mg | INTRAVENOUS | Status: DC
  Filled 2014-01-07: qty 150

## 2014-01-07 MED ORDER — VANCOMYCIN HCL IN DEXTROSE 1-5 GM/200ML-% IV SOLN
1000.0000 mg | INTRAVENOUS | Status: DC
  Administered 2014-01-08 – 2014-01-10 (×3): 1000 mg via INTRAVENOUS
  Filled 2014-01-07 (×4): qty 200

## 2014-01-07 NOTE — ED Notes (Signed)
[facility] notified of pts admisssion

## 2014-01-07 NOTE — ED Notes (Signed)
Right LE open wounds oozing and possibly infected.  Sent to ED from Dr. Ulis Rias office.

## 2014-01-07 NOTE — ED Provider Notes (Signed)
TIME SEEN: 4:30 PM  CHIEF COMPLAINT: Right lower extremity wound  HPI: Patient is an 80 year old female with history of coronary artery disease, peripheral vascular disease, hypertension, insulin-dependent diabetes who presents the emergency department with right lower extremity cellulitis. She was seen by her oncologist Dr. Marin Olp today for follow-up for her colon cancer which is in remission and was sent to the emergency department because of increased drainage from her right lower extremity area and she states that she is followed at wound care and has her dressing changed weekly. She reports in the past 2-3 weeks she has had increasing redness, warmth and pain. She states that while in his office her wound began draining onto the floor and was smelling foul. She states she has had chills. She has been on antibiotics for the past week that she states starts with a "C". She does not remember the name of this medication. She is not sure what her blood sugars have been running. She reports she is having nausea, diarrhea and chills. No fevers. No vomiting.  ROS: See HPI Constitutional: no fever  Eyes: no drainage  ENT: no runny nose   Cardiovascular:  no chest pain  Resp: no SOB  GI: no vomiting GU: no dysuria Integumentary: no rash  Allergy: no hives  Musculoskeletal: Bilateral leg swelling  Neurological: no slurred speech ROS otherwise negative  PAST MEDICAL HISTORY/PAST SURGICAL HISTORY:  Past Medical History  Diagnosis Date  . Anemia   . Diabetes mellitus   . CAD (coronary artery disease)   . Peripheral vascular disease   . Hypertension   . Cancer     colon; s/p colectomy 2007  . Anemia, iron deficiency 01/17/2012  . Hemorrhoids   . Nosebleed   . Stroke   . Cellulitis 03/08/12    MEDICATIONS:  Prior to Admission medications   Medication Sig Start Date End Date Taking? Authorizing Provider  amLODipine (NORVASC) 5 MG tablet Take 5 mg by mouth daily.    Historical Provider, MD   ANUCORT-HC 25 MG suppository Place rectally as needed.  12/04/12   Historical Provider, MD  aspirin EC 81 MG tablet Take 81 mg by mouth every evening.    Historical Provider, MD  BD PEN NEEDLE NANO U/F 32G X 4 MM MISC  05/13/13   Historical Provider, MD  cholecalciferol (VITAMIN D) 1000 UNITS tablet Take 1,000 Units by mouth daily.    Historical Provider, MD  FLUoxetine (PROZAC) 20 MG tablet Take 20 mg by mouth daily.    Historical Provider, MD  furosemide (LASIX) 20 MG tablet Take 20 mg by mouth 2 (two) times daily.     Historical Provider, MD  Insulin Glargine (LANTUS SOLOSTAR) 100 UNIT/ML Solostar Pen 40 units 05/13/13   Historical Provider, MD  insulin glargine (LANTUS) 100 UNIT/ML injection Inject 40 Units into the skin at bedtime.     Historical Provider, MD  levothyroxine (SYNTHROID, LEVOTHROID) 125 MCG tablet Take 125 mcg by mouth. Synthroid ONlY    Historical Provider, MD  losartan (COZAAR) 100 MG tablet Take 100 mg by mouth daily.    Historical Provider, MD  PRODIGY NO CODING BLOOD GLUC test strip  08/25/12   Historical Provider, MD    ALLERGIES:  Allergies  Allergen Reactions  . Blackberry [Rubus Fruticosus]     Can't take because of Cancer  . Meloxicam Shortness Of Breath  . Procaine Hcl Swelling  . Meloxicam     SOCIAL HISTORY:  History  Substance Use Topics  .  Smoking status: Former Smoker -- 1.00 packs/day for 13 years    Types: Cigarettes    Start date: 01/09/1955    Quit date: 03/13/1967  . Smokeless tobacco: Never Used     Comment: quit  45 years ago  . Alcohol Use: No    FAMILY HISTORY: Family History  Problem Relation Age of Onset  . Cancer Mother   . Diabetes Mother   . Other Father     bleeding problems  . Diabetes Sister   . Heart disease Daughter     before age 52  . Cancer Son     EXAM: BP 145/62  Pulse 104  Temp(Src) 98.4 F (36.9 C) (Oral)  Resp 20  Ht 5' 1.02" (1.55 m)  Wt 201 lb 1 oz (91.2 kg)  BMI 37.96 kg/m2  SpO2  97% CONSTITUTIONAL: Alert and oriented and responds appropriately to questions. Well-appearing; well-nourished HEAD: Normocephalic EYES: Conjunctivae clear, PERRL ENT: normal nose; no rhinorrhea; moist mucous membranes; pharynx without lesions noted NECK: Supple, no meningismus, no LAD  CARD: RRR; S1 and S2 appreciated; no murmurs, no clicks, no rubs, no gallops RESP: Normal chest excursion without splinting or tachypnea; breath sounds clear and equal bilaterally; no wheezes, no rhonchi, no rales,  ABD/GI: Normal bowel sounds; non-distended; soft, non-tender, no rebound, no guarding BACK:  The back appears normal and is non-tender to palpation, there is no CVA tenderness EXT: Normal ROM in all joints; flexion is mildly tender to palpation, 2+ DP pulses bilaterally, sensation to light touch intact diffusely, no joint effusions, bilateral lower extremity edema, right lower extremity is extremely tender to palpation with associated erythema to the proximal calf underneath the knee with warmth and induration and foul smelling drainage; normal capillary refill; no cyanosis    SKIN: Normal color for age and race; warm NEURO: Moves all extremities equally PSYCH: The patient's mood and manner are appropriate. Grooming and personal hygiene are appropriate.  MEDICAL DECISION MAKING: Patient here with right lower series cellulitis. She has been on oral outpatient antibiotics and states her symptoms are getting worse. She is having chills and nausea and diarrhea. Given she is a diabetic and is having systemic symptoms and is worsening despite oral antibiotics, I feel she will need admission. Will obtain cultures, lactate, labs and give IV vancomycin. Will give IV fentanyl for pain. Patient is comfortable with plan for admission. Her PCP is with Melinda Crutch with [facility].  ED PROGRESS: Patient has a mild leukocytosis with left shift. Her potassium is slightly elevated at 5.7. Will give IV fluids and obtain  EKG. Discussed with Dr. Candiss Norse with hospitalist service would like [place] also add on IV Zosyn and give Kayexalate. Will admit to inpatient, telemetry at [facility].   6:22 PM  EKG DOES not show any significant changes compared to prior.   EKG Interpretation  Date/Time:  Thursday January 07 2014 18:19:26 EDT Ventricular Rate:  61 PR Interval:    QRS Duration: 158 QT Interval:  464 QTC Calculation: 467 R Axis:   -72 Text Interpretation:  Undetermined rhythm Right bundle branch block Left anterior fascicular block Abnormal ECG No significant change since last tracing Reconfirmed by WARD,  DO, KRISTEN [phone]) on 01/07/2014 6:23:45 PM        [place], DO 01/07/14 1823

## 2014-01-07 NOTE — Progress Notes (Signed)
ANTIBIOTIC CONSULT NOTE - INITIAL  Pharmacy Consult for Vancomycin and Zosyn Indication: recurrent cellulits and diabetic ulcerations  Allergies  Allergen Reactions  . Blackberry [Rubus Fruticosus]     Can't take because of Cancer  . Meloxicam Shortness Of Breath  . Procaine Hcl Swelling  . Meloxicam     Patient Measurements: Height: 5' 1.02" (155 cm) Weight: 201 lb 1 oz (91.2 kg) (weight as of 09/2013) IBW/kg (Calculated) : 47.86 Adjusted Body Weight: 65 kg  Vital Signs: Temp: 98.4 F (36.9 C) (10/29 1656) Temp Source: Oral (10/29 1656) BP: 167/50 mmHg (10/29 1816) Pulse Rate: 62 (10/29 1816) Intake/Output from previous day:   Intake/Output from this shift:    Labs:  Recent Labs  01/07/14 1432 01/07/14 1655  WBC 10.5* 10.7*  HGB 10.5* 10.3*  PLT 249 250  CREATININE  --  1.60*   Estimated Creatinine Clearance: 28.4 ml/min (by C-G formula based on Cr of 1.6). No results found for this basename: VANCOTROUGH, VANCOPEAK, VANCORANDOM, GENTTROUGH, GENTPEAK, GENTRANDOM, TOBRATROUGH, TOBRAPEAK, TOBRARND, AMIKACINPEAK, AMIKACINTROU, AMIKACIN,  in the last 72 hours   Microbiology: No results found for this or any previous visit (from the past 720 hour(s)).  Medical History: Past Medical History  Diagnosis Date  . Anemia   . Diabetes mellitus   . CAD (coronary artery disease)   . Peripheral vascular disease   . Hypertension   . Cancer     colon; s/p colectomy 2007  . Anemia, iron deficiency 01/17/2012  . Hemorrhoids   . Nosebleed   . Stroke   . Cellulitis 03/08/12    Medications:  Prescriptions prior to admission  Medication Sig Dispense Refill  . amLODipine (NORVASC) 5 MG tablet Take 5 mg by mouth daily.      Nicolette Bang 25 MG suppository Place rectally as needed.       Marland Kitchen aspirin EC 81 MG tablet Take 81 mg by mouth every evening.      . BD PEN NEEDLE NANO U/F 32G X 4 MM MISC       . cholecalciferol (VITAMIN D) 1000 UNITS tablet Take 1,000 Units by mouth  daily.      Marland Kitchen FLUoxetine (PROZAC) 20 MG tablet Take 20 mg by mouth daily.      . furosemide (LASIX) 20 MG tablet Take 20 mg by mouth 2 (two) times daily.       . Insulin Glargine (LANTUS SOLOSTAR) 100 UNIT/ML Solostar Pen 40 units      . insulin glargine (LANTUS) 100 UNIT/ML injection Inject 40 Units into the skin at bedtime.       Marland Kitchen levothyroxine (SYNTHROID, LEVOTHROID) 125 MCG tablet Take 125 mcg by mouth. Synthroid ONlY      . losartan (COZAAR) 100 MG tablet Take 100 mg by mouth daily.      Marland Kitchen PRODIGY NO CODING BLOOD GLUC test strip        Scheduled:  . amLODipine  5 mg Oral Daily  . aspirin EC  81 mg Oral QPM  . FLUoxetine  20 mg Oral Daily  . [START ON 01/08/2014] furosemide  20 mg Oral BID  . heparin  5,000 Units Subcutaneous 3 times per day  . insulin aspart  0-9 Units Subcutaneous 6 times per day  . insulin glargine  40 Units Subcutaneous QHS  . [START ON 01/08/2014] levothyroxine  125 mcg Oral QAC breakfast  . [START ON 01/08/2014] piperacillin-tazobactam (ZOSYN)  IV  3.375 g Intravenous Q8H  . piperacillin-tazobactam  3.375 g Intravenous  Once  . sodium chloride  3 mL Intravenous Q12H  . vancomycin  1,000 mg Intravenous Once  . [START ON 01/08/2014] vancomycin  750 mg Intravenous Q24H   Infusions:  . sodium chloride    . sodium chloride     Assessment: 80yo female presents soaking wet and yellow wrapped right lower leg. Pharmacy is consulted to dose vancomycin and zosyn for recurrent cellulitis and diabetic ulcerations of her legs with concern for osteomyelitis. Pt is afebrile, WBC 10.7, sCr 1.6 with weight-corrected CrCl ~ 21mL/min.   Goal of Therapy:  Vancomycin trough level 15-20 mcg/ml  Plan:  Vancomycin 1250mg  IV load followed by 1000mg  IV q24h Zosyn 3.375g IV q8h Measure antibiotic drug levels at steady state Follow up culture results and renal function  Andrey Cota. Diona Foley, PharmD Clinical Pharmacist Pager [phone] 01/07/2014,8:55 PM

## 2014-01-07 NOTE — Progress Notes (Signed)
Hematology and Oncology Follow Up Visit  Sandra [name] [id number] 1930/10/08 80 y.o. 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  [name] is back for followup. We are getting her down to the emergency room. She comes in in a wheelchair It's amazing that showed that she is in right now. Her right foot is soaking wet. She has wrapping around the right lower leg. This is absolutely soaked and yellow. She probably has severe ulcerations or cellulitis of the right lower leg and foot. She may actually have osteomyelitis.  She is supposed to see the wound clinic tomorrow. However, I really think that she is going to have to be admitted for what probably is a severe infection related to her diabetes.  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 She is now 8 years out from her diagnosis and treatment.  Her diabetes is her biggest problem.  She says she has not slept for the past couple nights because of pain in the right foot. She denies any obvious fever.  Overall, her performance status is ECOG 2.   Medications: No current facility-administered medications for this visit. Current outpatient prescriptions:amLODipine (NORVASC) 5 MG tablet, Take 5 mg by mouth daily., Disp: , Rfl: ;  ANUCORT-HC 25 MG suppository, Place rectally as needed. , Disp: , Rfl: ;  aspirin EC 81 MG tablet, Take 81 mg by mouth every evening., Disp: , Rfl: ;  BD PEN NEEDLE NANO U/F 32G X 4 MM MISC, , Disp: , Rfl: ;  cholecalciferol (VITAMIN D) 1000 UNITS tablet, Take 1,000 Units by mouth daily., Disp: , Rfl:  FLUoxetine (PROZAC) 20 MG tablet, Take 20 mg by mouth daily., Disp: , Rfl: ;  furosemide (LASIX) 20 MG tablet, Take 20 mg by mouth 2 (two) times daily. , Disp: , Rfl: ;  Insulin Glargine (LANTUS SOLOSTAR) 100 UNIT/ML Solostar Pen, 40 units, Disp: , Rfl: ;   insulin glargine (LANTUS) 100 UNIT/ML injection, Inject 40 Units into the skin at bedtime. , Disp: , Rfl:  levothyroxine (SYNTHROID, LEVOTHROID) 125 MCG tablet, Take 125 mcg by mouth. Synthroid ONlY, Disp: , Rfl: ;  losartan (COZAAR) 100 MG tablet, Take 100 mg by mouth daily., Disp: , Rfl: ;  PRODIGY NO CODING BLOOD GLUC test strip, , Disp: , Rfl:  Facility-Administered Medications Ordered in Other Visits: 0.9 %  sodium chloride infusion, , Intravenous, Continuous, Thurnell Lose, MD;  Derrill Memo ON 01/08/2014] piperacillin-tazobactam (ZOSYN) IVPB 3.375 g, 3.375 g, Intravenous, Q8H, Benjamin G Mancheril, RPH;  piperacillin-tazobactam (ZOSYN) IVPB 3.375 g, 3.375 g, Intravenous, Once, Darnell Level Mancheril, RPH sodium chloride 0.9 % bolus 1,000 mL, 1,000 mL, Intravenous, Once, Kristen N Ward, DO, Last Rate: 1,000 mL/hr at 01/07/14 1814, 1,000 mL at 01/07/14 1814;  vancomycin (VANCOCIN) IVPB 1000 mg/200 mL premix, 1,000 mg, Intravenous, Once, [employer], RPH;  [START ON 01/08/2014] vancomycin (VANCOCIN) IVPB 750 mg/150 ml premix, 750 mg, Intravenous, Q24H, Darnell Level Mancheril, RPH  Allergies:  Allergies  Allergen Reactions  . Blackberry [Rubus Fruticosus]     Can't take because of Cancer  . Meloxicam Shortness Of Breath  . Procaine Hcl Swelling  . Meloxicam     Past Medical History, Surgical history, Social history, and Family History were reviewed and updated.  Review of Systems: As above  Physical Exam:  temperature is  97.7 F (36.5 C). Her blood pressure is 154/34 and her pulse is 82. Her respiration is 20.   Morbidly obese white female. Lungs are decreased at the bases. Cardiac exam regular in rhythm. Is a 1/6 systolic murmur. Abdomen soft. She is obese. There is no fluid wave. There is a well-healed laparotomy scar. There is no palpable liver or spleen tip. Exam no tenderness over the spine. Extremities shows a soaked dressing around her right lower leg. The proximal dorsum of her  right foot is incredibly red and swollen. Her left leg also has a dressing on the lower portion. Lab Results  Component Value Date   WBC 10.7* 01/07/2014   HGB 10.3* 01/07/2014   HCT 32.2* 01/07/2014   MCV 90.4 01/07/2014   PLT 250 01/07/2014     Chemistry      Component Value Date/Time   NA 137 01/07/2014 1655   NA 145 07/09/2013 1426   K 5.7* 01/07/2014 1655   K 4.4 07/09/2013 1426   CL 99 01/07/2014 1655   CL 103 07/09/2013 1426   CO2 25 01/07/2014 1655   CO2 31 07/09/2013 1426   BUN 56* 01/07/2014 1655   BUN 34* 07/09/2013 1426   CREATININE 1.60* 01/07/2014 1655   CREATININE 1.4* 07/09/2013 1426      Component Value Date/Time   CALCIUM 9.4 01/07/2014 1655   CALCIUM 9.2 07/09/2013 1426   ALKPHOS 120* 07/09/2013 1426   ALKPHOS 72 07/10/2012 1443   AST 20 07/09/2013 1426   AST 10 07/10/2012 1443   ALT 18 10/30/2013 1415   ALT 21 07/09/2013 1426   BILITOT 0.60 07/09/2013 1426   BILITOT 0.3 07/10/2012 1443         Impression and Plan: Sandra [name] is an 80 year old white female. She is history of stage IIIB colon cancer. This was diagnosed when she was living up in [place]. She is now 8 years.  I really don't feel is most we can do for her at this point. I believe that she is cured.  We will have to see what the emergency room does for her.  We will plan to get her back to see [place] in 6 months.  I totally was not expecting her to have this problem. I spent 30 minutes with her trying to arrange for her to go to the emergency room and explained to her why I thought we had to get her to the emergency room. Volanda Napoleon, MD 10/29/20156:51 PM

## 2014-01-07 NOTE — ED Notes (Signed)
MD at bedside. 

## 2014-01-07 NOTE — Progress Notes (Signed)
Pt arrived to unit alert and oriented x4. Oriented to room, unit, and staff.  Bed in lowest position and call bell is within reach. Will continue to monitor. 

## 2014-01-07 NOTE — H&P (Addendum)
Hospitalist Admission History and Physical  Patient name: Sandra [name] Medical record number: [id number] Date of birth: August 16, 1930 Age: 80 y.o. Gender: female  Primary Care Provider:  Melinda Crutch, MD  Chief Complaint: cellulitis, hyperkalemia  History of Present Illness:This is a 80 y.o. year old female with significant past medical history of IDDM, PVD, CVA, chronic diabetic ulcers, colon ca s/p colectomy  presenting with cellulitis, hyperkalemia. Pt went for oncology follow up today. Dr. Marin Olp noticed pt with marked RLE drainage, redness. Per pt she has had worsening pain and redness in RLE over past few weeks. Has chronic LE diabetic ulcers followed by wound care. Mild subjective fevers and chills at home. Currently lives in [facility]. States that sugars have been well controlled per pt. Pt was directed to ER at [facility].  T 98.4, HR 60s-100s, resp 20s, BP 140s-160s, satting >96% on RA. WBC 10.7, hgb 10.3, Cr 1.6, K 5.7. Glu 184. EKG w. RBBB and LAFB. No peaked T waves. Started on vanc and zosyn. Given po kayexalate x1.   Assessment and Plan: Sandra [name] is a 80 y.o. year old female presenting with cellulitis, hyperkalemia  Active Problems:   Cellulitis   Hyperkalemia   1- Cellulitis/PVD -cont vanc and zosyn -panculture -affected area margins marked and dated -wound care consult  -baseline PVD- consider vascular consult if minimal improvement in sxs  2- hyperkalemia -s/p kayexalate  -no significant/new EKG changes  - f/u BMET -hold ARB  3- IDDM -cont lantus  -SSI  -A1C   4- HTN -BP stable  -cont norvasc  -hold ARB in setting of hyperkalemia   5- Colon Ca -s/p colectomy  -disease free x 8 years per H-O   FEN/GI: heart healthy, carb modified diet  Prophylaxis: sub q heparin  Disposition: pending further evaluation  Code Status:Full code    Patient Active Problem List   Diagnosis Date Noted  . Cellulitis 01/07/2014  . Atherosclerosis of native arteries  of the extremities with ulceration(440.23) 11/26/2012  . PVD (peripheral vascular disease) 09/24/2012  . Ulcer of lower limb, unspecified-Bilateral legs 08/27/2012  . Pain in limb-Bilateral leg 08/27/2012  . Anemia, iron deficiency 01/17/2012  . Occlusion and stenosis of carotid artery without mention of cerebral infarction 11/21/2011  . Diabetes mellitus 09/28/2010  . Arthritis 09/28/2010   Past Medical History: Past Medical History  Diagnosis Date  . Anemia   . Diabetes mellitus   . CAD (coronary artery disease)   . Peripheral vascular disease   . Hypertension   . Cancer     colon; s/p colectomy 2007  . Anemia, iron deficiency 01/17/2012  . Hemorrhoids   . Nosebleed   . Stroke   . Cellulitis 03/08/12    Past Surgical History: Past Surgical History  Procedure Laterality Date  . Appendectomy    . Cholecystectomy    . Tonsilectomy, adenoidectomy, bilateral myringotomy and tubes    . Colon surgery      for colon CA  . Lower extremity angiogram  September 01, 2012  . Aortogram  September 01, 2012    Social History: History   Social History  . Marital Status: Divorced    Spouse Name: N/A    Number of Children: N/A  . Years of Education: N/A   Social History Main Topics  . Smoking status: Former Smoker -- 1.00 packs/day for 13 years    Types: Cigarettes    Start date: 01/09/1955    Quit date: 03/13/1967  . Smokeless tobacco: Never  Used     Comment: quit  45 years ago  . Alcohol Use: No  . Drug Use: No  . Sexual Activity: None   Other Topics Concern  . None   Social History Narrative  . None    Family History: Family History  Problem Relation Age of Onset  . Cancer Mother   . Diabetes Mother   . Other Father     bleeding problems  . Diabetes Sister   . Heart disease Daughter     before age 31  . Cancer Son     Allergies: Allergies  Allergen Reactions  . Blackberry [Rubus Fruticosus]     Can't take because of Cancer  . Meloxicam Shortness Of Breath  .  Procaine Hcl Swelling  . Meloxicam     Current Facility-Administered Medications  Medication Dose Route Frequency Provider Last Rate Last Dose  . 0.9 %  sodium chloride infusion   Intravenous Continuous Thurnell Lose, MD      . 0.9 %  sodium chloride infusion   Intravenous Continuous Shanda Howells, MD 75 mL/hr at 01/07/14 2100    . amLODipine (NORVASC) tablet 5 mg  5 mg Oral Daily Shanda Howells, MD      . aspirin EC tablet 81 mg  81 mg Oral QPM Shanda Howells, MD      . FLUoxetine (PROZAC) tablet 20 mg  20 mg Oral Daily Shanda Howells, MD      . Derrill Memo ON 01/08/2014] furosemide (LASIX) tablet 20 mg  20 mg Oral BID Shanda Howells, MD      . heparin injection 5,000 Units  5,000 Units Subcutaneous 3 times per day Shanda Howells, MD      . HYDROmorphone (DILAUDID) injection 0.5-1 mg  0.5-1 mg Intravenous Q2H PRN Shanda Howells, MD      . insulin aspart (novoLOG) injection 0-9 Units  0-9 Units Subcutaneous 6 times per day Shanda Howells, MD      . insulin glargine (LANTUS) injection 40 Units  40 Units Subcutaneous QHS Shanda Howells, MD      . Derrill Memo ON 01/08/2014] levothyroxine (SYNTHROID, LEVOTHROID) tablet 125 mcg  125 mcg Oral QAC breakfast Shanda Howells, MD      . Derrill Memo ON 01/08/2014] piperacillin-tazobactam (ZOSYN) IVPB 3.375 g  3.375 g Intravenous Q8H Thurnell Lose, MD      . piperacillin-tazobactam (ZOSYN) IVPB 3.375 g  3.375 g Intravenous Once Thurnell Lose, MD      . sodium chloride 0.9 % injection 3 mL  3 mL Intravenous Q12H Shanda Howells, MD      . vancomycin (VANCOCIN) IVPB 1000 mg/200 mL premix  1,000 mg Intravenous Once Thurnell Lose, MD      . Derrill Memo ON 01/08/2014] vancomycin (VANCOCIN) IVPB 750 mg/150 ml premix  750 mg Intravenous Q24H Thurnell Lose, MD       Review Of Systems: 12 point ROS negative except as noted above in HPI.  Physical Exam: Filed Vitals:   01/07/14 1816  BP: 167/50  Pulse: 62  Temp:   Resp:     General: alert, cooperative and moderately  obese HEENT: PERRLA and extra ocular movement intact Heart: S1, S2 normal, no murmur, rub or gallop, regular rate and rhythm Lungs: clear to auscultation, no wheezes or rales and unlabored breathing Abdomen: abdomen is soft without significant tenderness, masses, organomegaly or guarding Extremities: + distal RLE erythema and mild TTP, mild generalized skin weeping of serous fluid more distally  Skin:as above  Neurology: normal without focal findings  Labs and Imaging: Lab Results  Component Value Date/Time   NA 137 01/07/2014  4:55 PM   NA 145 07/09/2013  2:26 PM   K 5.7* 01/07/2014  4:55 PM   K 4.4 07/09/2013  2:26 PM   CL 99 01/07/2014  4:55 PM   CL 103 07/09/2013  2:26 PM   CO2 25 01/07/2014  4:55 PM   CO2 31 07/09/2013  2:26 PM   BUN 56* 01/07/2014  4:55 PM   BUN 34* 07/09/2013  2:26 PM   CREATININE 1.60* 01/07/2014  4:55 PM   CREATININE 1.4* 07/09/2013  2:26 PM   GLUCOSE 184* 01/07/2014  4:55 PM   GLUCOSE 183* 07/09/2013  2:26 PM   Lab Results  Component Value Date   WBC 10.7* 01/07/2014   HGB 10.3* 01/07/2014   HCT 32.2* 01/07/2014   MCV 90.4 01/07/2014   PLT 250 01/07/2014    No results found.         Shanda Howells MD  Pager: [phone]

## 2014-01-08 ENCOUNTER — Telehealth: Payer: Self-pay | Admitting: Hematology & Oncology

## 2014-01-08 DIAGNOSIS — M879 Osteonecrosis, unspecified: Secondary | ICD-10-CM | POA: Diagnosis present

## 2014-01-08 DIAGNOSIS — Z833 Family history of diabetes mellitus: Secondary | ICD-10-CM | POA: Diagnosis not present

## 2014-01-08 DIAGNOSIS — L89312 Pressure ulcer of right buttock, stage 2: Secondary | ICD-10-CM | POA: Diagnosis present

## 2014-01-08 DIAGNOSIS — F329 Major depressive disorder, single episode, unspecified: Secondary | ICD-10-CM | POA: Diagnosis present

## 2014-01-08 DIAGNOSIS — E875 Hyperkalemia: Secondary | ICD-10-CM | POA: Diagnosis present

## 2014-01-08 DIAGNOSIS — I2510 Atherosclerotic heart disease of native coronary artery without angina pectoris: Secondary | ICD-10-CM | POA: Diagnosis present

## 2014-01-08 DIAGNOSIS — E11622 Type 2 diabetes mellitus with other skin ulcer: Secondary | ICD-10-CM | POA: Diagnosis present

## 2014-01-08 DIAGNOSIS — E039 Hypothyroidism, unspecified: Secondary | ICD-10-CM | POA: Diagnosis present

## 2014-01-08 DIAGNOSIS — I10 Essential (primary) hypertension: Secondary | ICD-10-CM | POA: Diagnosis present

## 2014-01-08 DIAGNOSIS — E1129 Type 2 diabetes mellitus with other diabetic kidney complication: Secondary | ICD-10-CM | POA: Diagnosis present

## 2014-01-08 DIAGNOSIS — N179 Acute kidney failure, unspecified: Secondary | ICD-10-CM | POA: Diagnosis present

## 2014-01-08 DIAGNOSIS — M25512 Pain in left shoulder: Secondary | ICD-10-CM | POA: Diagnosis present

## 2014-01-08 DIAGNOSIS — L98499 Non-pressure chronic ulcer of skin of other sites with unspecified severity: Secondary | ICD-10-CM | POA: Diagnosis present

## 2014-01-08 DIAGNOSIS — Z809 Family history of malignant neoplasm, unspecified: Secondary | ICD-10-CM | POA: Diagnosis not present

## 2014-01-08 DIAGNOSIS — E1169 Type 2 diabetes mellitus with other specified complication: Secondary | ICD-10-CM | POA: Diagnosis present

## 2014-01-08 DIAGNOSIS — I129 Hypertensive chronic kidney disease with stage 1 through stage 4 chronic kidney disease, or unspecified chronic kidney disease: Secondary | ICD-10-CM | POA: Diagnosis present

## 2014-01-08 DIAGNOSIS — E11621 Type 2 diabetes mellitus with foot ulcer: Secondary | ICD-10-CM | POA: Diagnosis present

## 2014-01-08 DIAGNOSIS — Z7982 Long term (current) use of aspirin: Secondary | ICD-10-CM | POA: Diagnosis not present

## 2014-01-08 DIAGNOSIS — L97519 Non-pressure chronic ulcer of other part of right foot with unspecified severity: Secondary | ICD-10-CM | POA: Diagnosis present

## 2014-01-08 DIAGNOSIS — I452 Bifascicular block: Secondary | ICD-10-CM | POA: Diagnosis present

## 2014-01-08 DIAGNOSIS — Z794 Long term (current) use of insulin: Secondary | ICD-10-CM | POA: Diagnosis not present

## 2014-01-08 DIAGNOSIS — Z87891 Personal history of nicotine dependence: Secondary | ICD-10-CM | POA: Diagnosis not present

## 2014-01-08 DIAGNOSIS — Z9049 Acquired absence of other specified parts of digestive tract: Secondary | ICD-10-CM | POA: Diagnosis present

## 2014-01-08 DIAGNOSIS — D509 Iron deficiency anemia, unspecified: Secondary | ICD-10-CM | POA: Diagnosis present

## 2014-01-08 DIAGNOSIS — I7025 Atherosclerosis of native arteries of other extremities with ulceration: Secondary | ICD-10-CM | POA: Diagnosis present

## 2014-01-08 DIAGNOSIS — Z888 Allergy status to other drugs, medicaments and biological substances status: Secondary | ICD-10-CM | POA: Diagnosis not present

## 2014-01-08 DIAGNOSIS — M19019 Primary osteoarthritis, unspecified shoulder: Secondary | ICD-10-CM | POA: Diagnosis present

## 2014-01-08 DIAGNOSIS — E669 Obesity, unspecified: Secondary | ICD-10-CM | POA: Diagnosis present

## 2014-01-08 DIAGNOSIS — L03115 Cellulitis of right lower limb: Secondary | ICD-10-CM | POA: Diagnosis present

## 2014-01-08 DIAGNOSIS — F32A Depression, unspecified: Secondary | ICD-10-CM | POA: Diagnosis present

## 2014-01-08 DIAGNOSIS — E11649 Type 2 diabetes mellitus with hypoglycemia without coma: Secondary | ICD-10-CM | POA: Diagnosis present

## 2014-01-08 DIAGNOSIS — Z85038 Personal history of other malignant neoplasm of large intestine: Secondary | ICD-10-CM | POA: Diagnosis not present

## 2014-01-08 DIAGNOSIS — Z6836 Body mass index (BMI) 36.0-36.9, adult: Secondary | ICD-10-CM | POA: Diagnosis not present

## 2014-01-08 DIAGNOSIS — N183 Chronic kidney disease, stage 3 (moderate): Secondary | ICD-10-CM | POA: Diagnosis present

## 2014-01-08 DIAGNOSIS — Z8673 Personal history of transient ischemic attack (TIA), and cerebral infarction without residual deficits: Secondary | ICD-10-CM | POA: Diagnosis not present

## 2014-01-08 LAB — GLUCOSE, CAPILLARY
GLUCOSE-CAPILLARY: 106 mg/dL — AB (ref 70–99)
GLUCOSE-CAPILLARY: 126 mg/dL — AB (ref 70–99)
GLUCOSE-CAPILLARY: 139 mg/dL — AB (ref 70–99)
Glucose-Capillary: 169 mg/dL — ABNORMAL HIGH (ref 70–99)
Glucose-Capillary: 113 mg/dL — ABNORMAL HIGH (ref 70–99)
Glucose-Capillary: 171 mg/dL — ABNORMAL HIGH (ref 70–99)

## 2014-01-08 LAB — COMPREHENSIVE METABOLIC PANEL
ALBUMIN: 2.7 g/dL — AB (ref 3.5–5.2)
ALT: 12 U/L (ref 0–35)
AST: 17 U/L (ref 0–37)
Alkaline Phosphatase: 102 U/L (ref 39–117)
Anion gap: 12 (ref 5–15)
BUN: 44 mg/dL — ABNORMAL HIGH (ref 6–23)
CALCIUM: 8.8 mg/dL (ref 8.4–10.5)
CO2: 23 mEq/L (ref 19–32)
CREATININE: 1.35 mg/dL — AB (ref 0.50–1.10)
Chloride: 102 mEq/L (ref 96–112)
GFR calc Af Amer: 41 mL/min — ABNORMAL LOW (ref >90)
GFR calc non Af Amer: 36 mL/min — ABNORMAL LOW (ref >90)
Glucose, Bld: 173 mg/dL — ABNORMAL HIGH (ref 70–99)
Potassium: 4.7 mEq/L (ref 3.7–5.3)
Sodium: 137 mEq/L (ref 137–147)
Total Bilirubin: 0.4 mg/dL (ref 0.3–1.2)
Total Protein: 6.3 g/dL (ref 6.0–8.3)

## 2014-01-08 LAB — CBC WITH DIFFERENTIAL/PLATELET
Basophils Absolute: 0 10*3/uL (ref 0.0–0.1)
Basophils Relative: 1 % (ref 0–1)
EOS PCT: 5 % (ref 0–5)
Eosinophils Absolute: 0.4 10*3/uL (ref 0.0–0.7)
HEMATOCRIT: 30.1 % — AB (ref 36.0–46.0)
Hemoglobin: 9.7 g/dL — ABNORMAL LOW (ref 12.0–15.0)
LYMPHS PCT: 11 % — AB (ref 12–46)
Lymphs Abs: 0.9 10*3/uL (ref 0.7–4.0)
MCH: 28 pg (ref 26.0–34.0)
MCHC: 32.2 g/dL (ref 30.0–36.0)
MCV: 87 fL (ref 78.0–100.0)
Monocytes Absolute: 0.8 10*3/uL (ref 0.1–1.0)
Monocytes Relative: 10 % (ref 3–12)
NEUTROS ABS: 6.2 10*3/uL (ref 1.7–7.7)
Neutrophils Relative %: 73 % (ref 43–77)
Platelets: 239 10*3/uL (ref 150–400)
RBC: 3.46 MIL/uL — ABNORMAL LOW (ref 3.87–5.11)
RDW: 13.5 % (ref 11.5–15.5)
WBC: 8.3 10*3/uL (ref 4.0–10.5)

## 2014-01-08 LAB — RETICULOCYTES (CHCC)
ABS RETIC: 25.2 10*3/uL (ref 19.0–186.0)
RBC.: 3.6 MIL/uL — ABNORMAL LOW (ref 3.87–5.11)
Retic Ct Pct: 0.7 % (ref 0.4–2.3)

## 2014-01-08 LAB — HEMOGLOBIN A1C
HEMOGLOBIN A1C: 6.1 % — AB (ref <5.7)
Mean Plasma Glucose: 128 mg/dL — ABNORMAL HIGH (ref <117)

## 2014-01-08 LAB — IRON AND TIBC CHCC
%SAT: 19 % — AB (ref 21–57)
IRON: 50 ug/dL (ref 41–142)
TIBC: 270 ug/dL (ref 236–444)
UIBC: 220 ug/dL (ref 120–384)

## 2014-01-08 LAB — FERRITIN CHCC: FERRITIN: 187 ng/mL (ref 9–269)

## 2014-01-08 MED ORDER — METRONIDAZOLE 500 MG PO TABS
500.0000 mg | ORAL_TABLET | Freq: Three times a day (TID) | ORAL | Status: DC
  Administered 2014-01-08 – 2014-01-14 (×19): 500 mg via ORAL
  Filled 2014-01-08 (×21): qty 1

## 2014-01-08 MED ORDER — DEXTROSE 5 % IV SOLN
1.0000 g | INTRAVENOUS | Status: DC
  Administered 2014-01-08 – 2014-01-14 (×7): 1 g via INTRAVENOUS
  Filled 2014-01-08 (×7): qty 10

## 2014-01-08 NOTE — Progress Notes (Signed)
PROGRESS NOTE  Sandra [name] AJO:[id number] DOB: [birth year] DOA: 01/07/2014 PCP:  Melinda Crutch, MD  HPI/Recap of past 24 hours: Patient is an 80 year old female with past medical history of diabetes mellitus, chronic ulceration of right lower extremity secondary to peripheral vascular disease who was admitted to the hospitalist service on 10/29 for worsening lower extremity cellulitis for the past few weeks. Patient was started on broad-spectrum antibiotics initially. She was noted to have some mild acute on chronic renal failure as well as a potassium of 5.7.  Assessment/Plan: Principal Problem:   Cellulitis of leg, right: Colon antibiotic suggested to by mouth Flagyl, IV Rocephin and IV vancomycin. Wound care recommendations appreciated Active Problems:   DM type 2 causing renal disease: A1c Notes excellent control. Continue Lantus plus sliding scale   Anemia, iron deficiency: Stable    PVD (peripheral vascular disease): Patient already followed by vascular surgery. In review of the note 3 months ago, they were considering follow-up angiogram to determine if her vascular insufficiency had worsened. For now, would favor treating cellulitis and this can be followed as outpatient since she is already established    Morbid obesity: Patient meets criteria with BMI greater than 40. Hyperkalemia and Acute renal failure in the setting of stage III chronic kidney disease: Improved with gentle IV fluids. Hyperkalemia resolved as well    Essential hypertension: Blood pressure stable    Hypothyroidism: Stable continued on Synthroid.    Depression: Continue on Celexa    History of colon cancer   History of colectomy   Code Status: Full code  Family Communication: Left message with family  Disposition Plan: Continue until cellulitis improved   Consultants:  Wound care  Procedures:  None  Antibiotics:  IV Zosyn 10/29-10/30  IV vancomycin 10/29-present  IV Rocephin  10/30-present  By mouth Flagyl 10/30-present   Objective: BP 105/86  Pulse 62  Temp(Src) 98.7 F (37.1 C) (Oral)  Resp 18  Ht 5\' 1"  (1.549 m)  Wt 92.5 kg (203 lb 14.8 oz)  BMI 38.55 kg/m2  SpO2 85%  Intake/Output Summary (Last 24 hours) at 01/08/14 1507 Last data filed at 01/08/14 1427  Gross per 24 hour  Intake    200 ml  Output      1 ml  Net    199 ml   Filed Weights   01/07/14 1700 01/07/14 2045 01/08/14 0500  Weight: 91.2 kg (201 lb 1 oz) 92.5 kg (203 lb 14.8 oz) 92.5 kg (203 lb 14.8 oz)    Exam:   General:  Alert and oriented 3, no acute distress  Cardiovascular: Regular rate and rhythm, N4-B0, 2/6 systolic ejection murmur  Respiratory: Clear to auscultation bilaterally  Abdomen: Soft, nontender, nondistended, positive bowel sounds  Musculoskeletal: No clubbing or cyanosis, right lower extremity is significantly erythematous with 1-2 plus edema from the knee down along with tenderness and areas of dryness   Data Reviewed: Basic Metabolic Panel:  Recent Labs Lab 01/07/14 1655 01/07/14 2140 01/08/14 0425  NA 137  --  137  K 5.7*  --  4.7  CL 99  --  102  CO2 25  --  23  GLUCOSE 184*  --  173*  BUN 56*  --  44*  CREATININE 1.60* 1.40* 1.35*  CALCIUM 9.4  --  8.8   Liver Function Tests:  Recent Labs Lab 01/08/14 0425  AST 17  ALT 12  ALKPHOS 102  BILITOT 0.4  PROT 6.3  ALBUMIN 2.7*   No results  found for this basename: LIPASE, AMYLASE,  in the last 168 hours No results found for this basename: AMMONIA,  in the last 168 hours CBC:  Recent Labs Lab 01/07/14 1432 01/07/14 1655 01/07/14 2140 01/08/14 0425  WBC 10.5* 10.7* 8.3 8.3  NEUTROABS 8.2* 8.2*  --  6.2  HGB 10.5* 10.3* 10.2* 9.7*  HCT 32.5* 32.2* 30.7* 30.1*  MCV 91 90.4 87.0 87.0  PLT 249 250 248 239   Cardiac Enzymes:   No results found for this basename: CKTOTAL, CKMB, CKMBINDEX, TROPONINI,  in the last 168 hours BNP (last 3 results) No results found for this basename:  PROBNP,  in the last 8760 hours CBG:  Recent Labs Lab 01/07/14 2149 01/07/14 2341 01/08/14 0349 01/08/14 0807 01/08/14 1159  GLUCAP 126* 258* 169* 126* 113*    Recent Results (from the past 240 hour(s))  CULTURE, BLOOD (ROUTINE X 2)     Status: None   Collection Time    01/07/14  4:30 PM      Result Value Ref Range Status   Specimen Description BLOOD RIGHT HAND   Final   Special Requests BOTTLES DRAWN AEROBIC ONLY 3CC   Final   Culture  Setup Time     Final   Value: 01/07/2014 22:58     Performed at [facility]   Culture     Final   Value:        BLOOD CULTURE RECEIVED NO GROWTH TO DATE CULTURE WILL BE HELD FOR 5 DAYS BEFORE ISSUING A FINAL NEGATIVE REPORT     Performed at [facility]   Report Status PENDING   Incomplete  CULTURE, BLOOD (ROUTINE X 2)     Status: None   Collection Time    01/07/14  4:45 PM      Result Value Ref Range Status   Specimen Description BLOOD RIGHT WRIST   Final   Special Requests BOTTLES DRAWN AEROBIC AND ANAEROBIC 5CC EACH   Final   Culture  Setup Time     Final   Value: 01/07/2014 22:58     Performed at [facility]   Culture     Final   Value:        BLOOD CULTURE RECEIVED NO GROWTH TO DATE CULTURE WILL BE HELD FOR 5 DAYS BEFORE ISSUING A FINAL NEGATIVE REPORT     Performed at [facility]   Report Status PENDING   Incomplete  MRSA PCR SCREENING     Status: None   Collection Time    01/07/14 10:08 PM      Result Value Ref Range Status   MRSA by PCR NEGATIVE  NEGATIVE Final   Comment:            The GeneXpert MRSA Assay (FDA     approved for NASAL specimens     only), is one component of a     comprehensive MRSA colonization     surveillance program. It is not     intended to diagnose MRSA     infection nor to guide or     monitor treatment for     MRSA infections.     Studies: No results found.  Scheduled Meds: . amLODipine  5 mg Oral Daily  . aspirin EC  81 mg Oral QPM  . FLUoxetine  20 mg  Oral Daily  . furosemide  20 mg Oral BID  . heparin  5,000 Units Subcutaneous 3 times per day  . insulin  aspart  0-9 Units Subcutaneous 6 times per day  . insulin glargine  40 Units Subcutaneous QHS  . levothyroxine  125 mcg Oral QAC breakfast  . piperacillin-tazobactam (ZOSYN)  IV  3.375 g Intravenous Q8H  . sodium chloride  3 mL Intravenous Q12H  . vancomycin  1,000 mg Intravenous Q24H    Continuous Infusions: . sodium chloride    . sodium chloride 75 mL/hr at 01/07/14 2100     Time spent: 25 min  [facility] Pager [phone]. If 7PM-7AM, please contact night-coverage at [email or web address], password [facility] 01/08/2014, 3:07 PM  LOS: 1 day

## 2014-01-08 NOTE — Telephone Encounter (Signed)
Mailed April schedule

## 2014-01-08 NOTE — Consult Note (Signed)
WOC wound consult note Reason for Consult: Consult requested for right foot.  Pt was admitted with cellulitis and had large amt drainage according to the EMR, this has improved since IV antibiotics have been started.  She is followed at the outpatient wound care center prior to admission. Pt has been followed by [facility] in the past and they do not want to perform a re-vascularization unless absolutely necessary, pt states. Wound type: Full thickness Measurement: 3X5X.1cm Wound bed: red and dry wound, very painful to touch Drainage (amount, consistency, odor)  Periwound:  Anterior foot with generalized edema and erythremia.   Dressing procedure/placement/frequency: Xeroform gauze to promote moist healing and decrease discomfort with dressing changes.  Pt can resume follow-up at the outpatient wound care center after discharge. Please re-consult if further assistance is needed.  Thank-you,  Julien Girt MSN, [place], [place], [place], [place]

## 2014-01-08 NOTE — Care Management Note (Addendum)
    Page 1 of 2   01/14/2014     3:58:29 PM CARE MANAGEMENT NOTE 01/14/2014  Patient:  Sandra [name], Sandra [name]   Account Number:  [id number]  Date Initiated:  01/08/2014  Documentation initiated by:  Tomi Bamberger  Subjective/Objective Assessment:   dx cellulitis  admit- from [facility]     Action/Plan:   mri of shoulder   Anticipated DC Date:  01/14/2014   Anticipated DC Plan:  [place]  CM consult      [facility] Choice  HOME HEALTH   Choice offered to / List presented to:  C-1 Patient        [place] arranged  HH-2 PT  HH-1 RN      [facility] agency  OTHER - SEE NOTE  [place]   Status of service:  Completed, signed off Medicare Important Message given?  YES (If response is "NO", the following Medicare IM given date fields will be blank) Date Medicare IM given:  01/11/2014 Medicare IM given by:  Tomi Bamberger Date Additional Medicare IM given:  01/14/2014 Additional Medicare IM given by:  [place] Plan  Discharge Disposition:  HOME/SELF CARE  Per UR Regulation:  Reviewed for med. necessity/level of care/duration of stay  If discussed at [place] of Stay Meetings, dates discussed:    Comments:  01/14/14 [place] [phone] Patient for dc today, Patient will have physical therapy with [facility] at [facility], order was faxed on 01/13/14. Patient chose [facility], referral made to Sherrin Daisy notified.  Soc wil begin 24-48 hrs post dc.  01/12/14 [place], BSN [phone] patient still refusing surgery, but c/o right shouler pain, will get mri of shoulder if neg plan for dc 11/4.  NCM called [facility] ,left message for [facility] at [phone], this is who the facility uses for hh services.  Left message for somone with [facility] to call NCM back to see what their needs may be requiring a script  etc.

## 2014-01-09 LAB — CBC WITH DIFFERENTIAL/PLATELET
Basophils Absolute: 0 10*3/uL (ref 0.0–0.1)
Basophils Relative: 1 % (ref 0–1)
Eosinophils Absolute: 0.4 10*3/uL (ref 0.0–0.7)
Eosinophils Relative: 7 % — ABNORMAL HIGH (ref 0–5)
HCT: 29.5 % — ABNORMAL LOW (ref 36.0–46.0)
Hemoglobin: 9.5 g/dL — ABNORMAL LOW (ref 12.0–15.0)
LYMPHS ABS: 1 10*3/uL (ref 0.7–4.0)
LYMPHS PCT: 15 % (ref 12–46)
MCH: 28.4 pg (ref 26.0–34.0)
MCHC: 32.2 g/dL (ref 30.0–36.0)
MCV: 88.1 fL (ref 78.0–100.0)
MONOS PCT: 8 % (ref 3–12)
Monocytes Absolute: 0.5 10*3/uL (ref 0.1–1.0)
NEUTROS ABS: 4.5 10*3/uL (ref 1.7–7.7)
NEUTROS PCT: 69 % (ref 43–77)
PLATELETS: 204 10*3/uL (ref 150–400)
RBC: 3.35 MIL/uL — AB (ref 3.87–5.11)
RDW: 13.5 % (ref 11.5–15.5)
WBC: 6.4 10*3/uL (ref 4.0–10.5)

## 2014-01-09 LAB — BASIC METABOLIC PANEL
Anion gap: 13 (ref 5–15)
BUN: 37 mg/dL — AB (ref 6–23)
CHLORIDE: 104 meq/L (ref 96–112)
CO2: 21 meq/L (ref 19–32)
Calcium: 8.7 mg/dL (ref 8.4–10.5)
Creatinine, Ser: 1.41 mg/dL — ABNORMAL HIGH (ref 0.50–1.10)
GFR calc Af Amer: 39 mL/min — ABNORMAL LOW (ref >90)
GFR calc non Af Amer: 34 mL/min — ABNORMAL LOW (ref >90)
GLUCOSE: 101 mg/dL — AB (ref 70–99)
POTASSIUM: 4.6 meq/L (ref 3.7–5.3)
SODIUM: 138 meq/L (ref 137–147)

## 2014-01-09 LAB — GLUCOSE, CAPILLARY
GLUCOSE-CAPILLARY: 115 mg/dL — AB (ref 70–99)
GLUCOSE-CAPILLARY: 96 mg/dL (ref 70–99)
Glucose-Capillary: 81 mg/dL (ref 70–99)
Glucose-Capillary: 82 mg/dL (ref 70–99)
Glucose-Capillary: 158 mg/dL — ABNORMAL HIGH (ref 70–99)

## 2014-01-09 NOTE — Progress Notes (Signed)
PROGRESS NOTE  Sandra [name]:[id number] DOB: Apr 25, 1930 DOA: 01/07/2014 PCP:  Melinda Crutch, MD  HPI/Recap of past 24 hours: Patient is an 80 year old female with past medical history of diabetes mellitus, chronic ulceration of right lower extremity secondary to peripheral vascular disease who was admitted to the hospitalist service on 10/29 for worsening lower extremity cellulitis for the past few weeks. Patient was started on broad-spectrum antibiotics initially. She was noted to have some mild acute on chronic renal failure as well as a potassium of 5.7.  Patient doing okay. Sugars have remained stable. She does continue to have significant pain and tenderness on the right foot and toes. Also some drainage.  Assessment/Plan: Principal Problem:   Cellulitis of leg, right:  antibiotic suggested to by mouth Flagyl, IV Rocephin and IV vancomycin. Wound care recommendations appreciated. Cellulitis itself looks to be better. Given that this is going to be a chronic problem, we'll see if we can get an angiogram on Monday to better ascertain her circulation Active Problems:   DM type 2 causing renal disease: A1c Notes excellent control. Continue Lantus plus sliding scale-sugars have remained stable   Anemia, iron deficiency: Stable    PVD (peripheral vascular disease): Patient already followed by vascular surgery. In review of the note 3 months ago, they were considering follow-up angiogram to determine if her vascular insufficiency had worsened. For now, would favor treating cellulitis and will see about getting angiogram on Monday    Morbid obesity: Patient meets criteria with BMI greater than 40. Hyperkalemia and Acute renal failure in the setting of stage III chronic kidney disease: Improved with gentle IV fluids. Hyperkalemia resolved as well    Essential hypertension: Blood pressure stable    Hypothyroidism: Stable continued on Synthroid.    Depression: Continue on Celexa    History of  colon cancer   History of colectomy   Code Status: Full code  Family Communication: None present  Disposition Plan: Continue until cellulitis improved   Consultants:  Wound care  Procedures:  None  Antibiotics:  IV Zosyn 10/29-10/30  IV vancomycin 10/29-present  IV Rocephin 10/30-present  By mouth Flagyl 10/30-present   Objective: BP 146/45  Pulse 60  Temp(Src) 98.1 F (36.7 C) (Oral)  Resp 16  Ht 5\' 1"  (1.549 m)  Wt 92.5 kg (203 lb 14.8 oz)  BMI 38.55 kg/m2  SpO2 92%  Intake/Output Summary (Last 24 hours) at 01/09/14 1348 Last data filed at 01/09/14 0907  Gross per 24 hour  Intake 1328.58 ml  Output      1 ml  Net 1327.58 ml   Filed Weights   01/07/14 1700 01/07/14 2045 01/08/14 0500  Weight: 91.2 kg (201 lb 1 oz) 92.5 kg (203 lb 14.8 oz) 92.5 kg (203 lb 14.8 oz)    Exam:   General:  Alert and oriented 3, no acute distress  Cardiovascular: Regular rate and rhythm, E7-M0, 2/6 systolic ejection murmur  Respiratory: Clear to auscultation bilaterally  Abdomen: Soft, nontender, nondistended, positive bowel sounds  Musculoskeletal: No clubbing or cyanosis, right lower extremity is persistent erythematous with 1-2 plus edema from the knee down along with tenderness and areas of dryness -erythema improved  Data Reviewed: Basic Metabolic Panel:  Recent Labs Lab 01/07/14 1655 01/07/14 2140 01/08/14 0425 01/09/14 0619  NA 137  --  137 138  K 5.7*  --  4.7 4.6  CL 99  --  102 104  CO2 25  --  23 21  GLUCOSE 184*  --  173* 101*  BUN 56*  --  44* 37*  CREATININE 1.60* 1.40* 1.35* 1.41*  CALCIUM 9.4  --  8.8 8.7   Liver Function Tests:  Recent Labs Lab 01/08/14 0425  AST 17  ALT 12  ALKPHOS 102  BILITOT 0.4  PROT 6.3  ALBUMIN 2.7*   No results found for this basename: LIPASE, AMYLASE,  in the last 168 hours No results found for this basename: AMMONIA,  in the last 168 hours CBC:  Recent Labs Lab 01/07/14 1432 01/07/14 1655  01/07/14 2140 01/08/14 0425 01/09/14 0619  WBC 10.5* 10.7* 8.3 8.3 6.4  NEUTROABS 8.2* 8.2*  --  6.2 4.5  HGB 10.5* 10.3* 10.2* 9.7* 9.5*  HCT 32.5* 32.2* 30.7* 30.1* 29.5*  MCV 91 90.4 87.0 87.0 88.1  PLT 249 250 248 239 204   Cardiac Enzymes:   No results found for this basename: CKTOTAL, CKMB, CKMBINDEX, TROPONINI,  in the last 168 hours BNP (last 3 results) No results found for this basename: PROBNP,  in the last 8760 hours CBG:  Recent Labs Lab 01/08/14 2019 01/08/14 2331 01/09/14 0403 01/09/14 0821 01/09/14 1214  GLUCAP 139* 106* 115* 81 96    Recent Results (from the past 240 hour(s))  CULTURE, BLOOD (ROUTINE X 2)     Status: None   Collection Time    01/07/14  4:30 PM      Result Value Ref Range Status   Specimen Description BLOOD RIGHT HAND   Final   Special Requests BOTTLES DRAWN AEROBIC ONLY 3CC   Final   Culture  Setup Time     Final   Value: 01/07/2014 22:58     Performed at [facility]   Culture     Final   Value:        BLOOD CULTURE RECEIVED NO GROWTH TO DATE CULTURE WILL BE HELD FOR 5 DAYS BEFORE ISSUING A FINAL NEGATIVE REPORT     Performed at [facility]   Report Status PENDING   Incomplete  CULTURE, BLOOD (ROUTINE X 2)     Status: None   Collection Time    01/07/14  4:45 PM      Result Value Ref Range Status   Specimen Description BLOOD RIGHT WRIST   Final   Special Requests BOTTLES DRAWN AEROBIC AND ANAEROBIC 5CC EACH   Final   Culture  Setup Time     Final   Value: 01/07/2014 22:58     Performed at [facility]   Culture     Final   Value:        BLOOD CULTURE RECEIVED NO GROWTH TO DATE CULTURE WILL BE HELD FOR 5 DAYS BEFORE ISSUING A FINAL NEGATIVE REPORT     Performed at [facility]   Report Status PENDING   Incomplete  MRSA PCR SCREENING     Status: None   Collection Time    01/07/14 10:08 PM      Result Value Ref Range Status   MRSA by PCR NEGATIVE  NEGATIVE Final   Comment:            The  GeneXpert MRSA Assay (FDA     approved for NASAL specimens     only), is one component of a     comprehensive MRSA colonization     surveillance program. It is not     intended to diagnose MRSA     infection nor to guide or     monitor  treatment for     MRSA infections.     Studies: No results found.  Scheduled Meds: . amLODipine  5 mg Oral Daily  . aspirin EC  81 mg Oral QPM  . cefTRIAXone (ROCEPHIN)  IV  1 g Intravenous Q24H  . FLUoxetine  20 mg Oral Daily  . furosemide  20 mg Oral BID  . heparin  5,000 Units Subcutaneous 3 times per day  . insulin aspart  0-9 Units Subcutaneous 6 times per day  . insulin glargine  40 Units Subcutaneous QHS  . levothyroxine  125 mcg Oral QAC breakfast  . metroNIDAZOLE  500 mg Oral 3 times per day  . sodium chloride  3 mL Intravenous Q12H  . vancomycin  1,000 mg Intravenous Q24H    Continuous Infusions: . sodium chloride 10 mL/hr at 01/08/14 1822     Time spent: 15 min  [place] [facility] Pager [phone]. If 7PM-7AM, please contact night-coverage at [email or web address], password [email or web address] [facility] 01/09/2014, 1:48 PM  LOS: 2 days

## 2014-01-10 LAB — CBC WITH DIFFERENTIAL/PLATELET
BASOS ABS: 0.1 10*3/uL (ref 0.0–0.1)
BASOS PCT: 1 % (ref 0–1)
Eosinophils Absolute: 0.5 10*3/uL (ref 0.0–0.7)
Eosinophils Relative: 8 % — ABNORMAL HIGH (ref 0–5)
HEMATOCRIT: 30.4 % — AB (ref 36.0–46.0)
HEMOGLOBIN: 9.8 g/dL — AB (ref 12.0–15.0)
LYMPHS PCT: 17 % (ref 12–46)
Lymphs Abs: 1.1 10*3/uL (ref 0.7–4.0)
MCH: 28.5 pg (ref 26.0–34.0)
MCHC: 32.2 g/dL (ref 30.0–36.0)
MCV: 88.4 fL (ref 78.0–100.0)
MONOS PCT: 9 % (ref 3–12)
Monocytes Absolute: 0.6 10*3/uL (ref 0.1–1.0)
NEUTROS ABS: 4.1 10*3/uL (ref 1.7–7.7)
Neutrophils Relative %: 65 % (ref 43–77)
Platelets: 216 10*3/uL (ref 150–400)
RBC: 3.44 MIL/uL — ABNORMAL LOW (ref 3.87–5.11)
RDW: 13.6 % (ref 11.5–15.5)
WBC: 6.3 10*3/uL (ref 4.0–10.5)

## 2014-01-10 LAB — GLUCOSE, CAPILLARY
GLUCOSE-CAPILLARY: 96 mg/dL (ref 70–99)
GLUCOSE-CAPILLARY: 132 mg/dL — AB (ref 70–99)
Glucose-Capillary: 111 mg/dL — ABNORMAL HIGH (ref 70–99)
Glucose-Capillary: 69 mg/dL — ABNORMAL LOW (ref 70–99)
Glucose-Capillary: 120 mg/dL — ABNORMAL HIGH (ref 70–99)
Glucose-Capillary: 75 mg/dL (ref 70–99)
Glucose-Capillary: 168 mg/dL — ABNORMAL HIGH (ref 70–99)

## 2014-01-10 MED ORDER — INSULIN ASPART 100 UNIT/ML ~~LOC~~ SOLN
0.0000 [IU] | Freq: Every day | SUBCUTANEOUS | Status: DC
Start: 2014-01-10 — End: 2014-01-14

## 2014-01-10 MED ORDER — INSULIN ASPART 100 UNIT/ML ~~LOC~~ SOLN
0.0000 [IU] | Freq: Three times a day (TID) | SUBCUTANEOUS | Status: DC
Start: 2014-01-10 — End: 2014-01-14
  Administered 2014-01-10 – 2014-01-11 (×2): 2 [IU] via SUBCUTANEOUS
  Administered 2014-01-12: 1 [IU] via SUBCUTANEOUS
  Administered 2014-01-13 – 2014-01-14 (×2): 2 [IU] via SUBCUTANEOUS

## 2014-01-10 NOTE — Progress Notes (Signed)
PROGRESS NOTE  Sandra [name]:[id number] DOB: 1930-07-09 DOA: 01/07/2014 PCP:  Melinda Crutch, MD  HPI/Recap of past 24 hours: Patient is an 80 year old female with past medical history of diabetes mellitus, chronic ulceration of right lower extremity secondary to peripheral vascular disease who was admitted to the hospitalist service on 10/29 for worsening lower extremity cellulitis for the past few weeks. Patient was started on broad-spectrum antibiotics initially. She was noted to have some mild acute on chronic renal failure as well as a potassium of 5.7.  Patient about the same. Did not sleep very well. Had episode of hypoglycemia last night. Still having some significant pain in regards to her right foot  Assessment/Plan: Principal Problem:   Cellulitis of leg, right:  antibiotic suggested to by mouth Flagyl, IV Rocephin and IV vancomycin. Wound care recommendations appreciated. Cellulitis mildly improved.. Plan for angiogram tomorrow to better appreciate circulation.  Active Problems:   DM type 2 causing renal disease: A1c Notes excellent control. With hypoglycemia, will change frequency of sliding scale    Anemia, iron deficiency: Stable    PVD (peripheral vascular disease): Patient already followed by vascular surgery. In review of the note 3 months ago, they were considering follow-up angiogram to determine if her vascular insufficiency had worsened. For now, would favor treating cellulitis and will see about getting angiogram on Monday    Morbid obesity: Patient meets criteria with BMI greater than 40. Hyperkalemia and Acute renal failure in the setting of stage III chronic kidney disease: Improved with gentle IV fluids. Hyperkalemia resolved as well    Essential hypertension: Blood pressure stable    Hypothyroidism: Stable continued on Synthroid.    Depression: Continue on Celexa    History of colon cancer   History of colectomy   Code Status: Full code  Family  Communication: None present  Disposition Plan: Continue until cellulitis improved or long-term plan set   Consultants:  Wound care  Procedures:  None  Antibiotics:  IV Zosyn 10/29-10/30  IV vancomycin 10/29-present  IV Rocephin 10/30-present  By mouth Flagyl 10/30-present   Objective: BP 140/38 mmHg  Pulse 62  Temp(Src) 97.9 F (36.6 C) (Oral)  Resp 17  Ht 5\' 1"  (1.549 m)  Wt 92.6 kg (204 lb 2.3 oz)  BMI 38.59 kg/m2  SpO2 90%  Intake/Output Summary (Last 24 hours) at 01/10/14 1328 Last data filed at 01/10/14 1100  Gross per 24 hour  Intake      0 ml  Output   2700 ml  Net  -2700 ml   Filed Weights   01/07/14 2045 01/08/14 0500 01/10/14 0630  Weight: 92.5 kg (203 lb 14.8 oz) 92.5 kg (203 lb 14.8 oz) 92.6 kg (204 lb 2.3 oz)    Exam:   General:  Alert and oriented 3, no acute distress  Cardiovascular: Regular rate and rhythm, H8-N2, 2/6 systolic ejection murmur  Respiratory: Clear to auscultation bilaterally  Abdomen: Soft, nontender, nondistended, positive bowel sounds  Musculoskeletal: No clubbing or cyanosis, right lower extremity is persistent erythematous with 1-2 plus edema from the knee down along with tenderness and areas of dryness -erythema about the same since yesterday  Data Reviewed: Basic Metabolic Panel:  Recent Labs Lab 01/07/14 1655 01/07/14 2140 01/08/14 0425 01/09/14 0619  NA 137  --  137 138  K 5.7*  --  4.7 4.6  CL 99  --  102 104  CO2 25  --  23 21  GLUCOSE 184*  --  173* 101*  BUN 56*  --  44* 37*  CREATININE 1.60* 1.40* 1.35* 1.41*  CALCIUM 9.4  --  8.8 8.7   Liver Function Tests:  Recent Labs Lab 01/08/14 0425  AST 17  ALT 12  ALKPHOS 102  BILITOT 0.4  PROT 6.3  ALBUMIN 2.7*   No results for input(s): LIPASE, AMYLASE in the last 168 hours. No results for input(s): AMMONIA in the last 168 hours. CBC:  Recent Labs Lab 01/07/14 1432 01/07/14 1655 01/07/14 2140 01/08/14 0425 01/09/14 0619  01/10/14 0540  WBC 10.5* 10.7* 8.3 8.3 6.4 6.3  NEUTROABS 8.2* 8.2*  --  6.2 4.5 4.1  HGB 10.5* 10.3* 10.2* 9.7* 9.5* 9.8*  HCT 32.5* 32.2* 30.7* 30.1* 29.5* 30.4*  MCV 91 90.4 87.0 87.0 88.1 88.4  PLT 249 250 248 239 204 216   Cardiac Enzymes:   No results for input(s): CKTOTAL, CKMB, CKMBINDEX, TROPONINI in the last 168 hours. BNP (last 3 results) No results for input(s): PROBNP in the last 8760 hours. CBG:  Recent Labs Lab 01/10/14 0022 01/10/14 0427 01/10/14 0502 01/10/14 0813 01/10/14 1140  GLUCAP 111* 69* 120* 75 96    Recent Results (from the past 240 hour(s))  Culture, blood (routine x 2)     Status: None (Preliminary result)   Collection Time: 01/07/14  4:30 PM  Result Value Ref Range Status   Specimen Description BLOOD RIGHT HAND  Final   Special Requests BOTTLES DRAWN AEROBIC ONLY 3CC  Final   Culture  Setup Time   Final    01/07/2014 22:58 Performed at [facility]   Culture   Final           BLOOD CULTURE RECEIVED NO GROWTH TO DATE CULTURE WILL BE HELD FOR 5 DAYS BEFORE ISSUING A FINAL NEGATIVE REPORT Performed at [facility]   Report Status PENDING  Incomplete  Culture, blood (routine x 2)     Status: None (Preliminary result)   Collection Time: 01/07/14  4:45 PM  Result Value Ref Range Status   Specimen Description BLOOD RIGHT WRIST  Final   Special Requests BOTTLES DRAWN AEROBIC AND ANAEROBIC 5CC EACH  Final   Culture  Setup Time   Final    01/07/2014 22:58 Performed at [facility]   Culture   Final           BLOOD CULTURE RECEIVED NO GROWTH TO DATE CULTURE WILL BE HELD FOR 5 DAYS BEFORE ISSUING A FINAL NEGATIVE REPORT Performed at [facility]   Report Status PENDING  Incomplete  MRSA PCR Screening     Status: None   Collection Time: 01/07/14 10:08 PM  Result Value Ref Range Status   MRSA by PCR NEGATIVE NEGATIVE Final    Comment:        The GeneXpert MRSA Assay (FDA approved for NASAL specimens only), is one  component of a comprehensive MRSA colonization surveillance program. It is not intended to diagnose MRSA infection nor to guide or monitor treatment for MRSA infections.     Studies: No results found.  Scheduled Meds: . amLODipine  5 mg Oral Daily  . aspirin EC  81 mg Oral QPM  . cefTRIAXone (ROCEPHIN)  IV  1 g Intravenous Q24H  . FLUoxetine  20 mg Oral Daily  . furosemide  20 mg Oral BID  . heparin  5,000 Units Subcutaneous 3 times per day  . insulin aspart  0-9 Units Subcutaneous 6 times per day  . insulin glargine  40 Units Subcutaneous QHS  . levothyroxine  125 mcg Oral QAC breakfast  . metroNIDAZOLE  500 mg Oral 3 times per day  . sodium chloride  3 mL Intravenous Q12H  . vancomycin  1,000 mg Intravenous Q24H    Continuous Infusions: . sodium chloride 10 mL/hr at 01/09/14 2108     Time spent: 15 min  [place] [facility] Pager [phone]. If 7PM-7AM, please contact night-coverage at [email or web address], password [facility] 01/10/2014, 1:28 PM  LOS: 3 days

## 2014-01-10 NOTE — Progress Notes (Signed)
ANTIBIOTIC CONSULT NOTE - Follow-up  Pharmacy Consult for Vancomycin and Zosyn Indication: recurrent cellulits and diabetic ulcerations  Allergies  Allergen Reactions  . Blackberry [Rubus Fruticosus]     Can't take because of Cancer  . Meloxicam Shortness Of Breath  . Procaine Hcl Swelling  . Meloxicam     Patient Measurements: Height: 5\' 1"  (154.9 cm) Weight: 204 lb 2.3 oz (92.6 kg) IBW/kg (Calculated) : 47.8 Adjusted Body Weight: 65 kg  Vital Signs: Temp: 97.9 F (36.6 C) (11/01 1147) Temp Source: Oral (11/01 1147) BP: 140/38 mmHg (11/01 1147) Pulse Rate: 62 (11/01 1147) Intake/Output from previous day: 10/31 0701 - 11/01 0700 In: -  Out: 3600 [Urine:3600] Intake/Output from this shift: Total I/O In: 200 [P.O.:200] Out: -   Labs:  Recent Labs  01/07/14 2140 01/08/14 0425 01/09/14 0619 01/10/14 0540  WBC 8.3 8.3 6.4 6.3  HGB 10.2* 9.7* 9.5* 9.8*  PLT 248 239 204 216  CREATININE 1.40* 1.35* 1.41*  --    Estimated Creatinine Clearance: 32.5 mL/min (by C-G formula based on Cr of 1.41). No results for input(s): VANCOTROUGH, VANCOPEAK, VANCORANDOM, GENTTROUGH, GENTPEAK, GENTRANDOM, TOBRATROUGH, TOBRAPEAK, TOBRARND, AMIKACINPEAK, AMIKACINTROU, AMIKACIN in the last 72 hours.   Microbiology: Recent Results (from the past 720 hour(s))  Culture, blood (routine x 2)     Status: None (Preliminary result)   Collection Time: 01/07/14  4:30 PM  Result Value Ref Range Status   Specimen Description BLOOD RIGHT HAND  Final   Special Requests BOTTLES DRAWN AEROBIC ONLY 3CC  Final   Culture  Setup Time   Final    01/07/2014 22:58 Performed at [facility]   Culture   Final           BLOOD CULTURE RECEIVED NO GROWTH TO DATE CULTURE WILL BE HELD FOR 5 DAYS BEFORE ISSUING A FINAL NEGATIVE REPORT Performed at [facility]   Report Status PENDING  Incomplete  Culture, blood (routine x 2)     Status: None (Preliminary result)   Collection Time: 01/07/14  4:45  PM  Result Value Ref Range Status   Specimen Description BLOOD RIGHT WRIST  Final   Special Requests BOTTLES DRAWN AEROBIC AND ANAEROBIC 5CC EACH  Final   Culture  Setup Time   Final    01/07/2014 22:58 Performed at [facility]   Culture   Final           BLOOD CULTURE RECEIVED NO GROWTH TO DATE CULTURE WILL BE HELD FOR 5 DAYS BEFORE ISSUING A FINAL NEGATIVE REPORT Performed at [facility]   Report Status PENDING  Incomplete  MRSA PCR Screening     Status: None   Collection Time: 01/07/14 10:08 PM  Result Value Ref Range Status   MRSA by PCR NEGATIVE NEGATIVE Final    Comment:        The GeneXpert MRSA Assay (FDA approved for NASAL specimens only), is one component of a comprehensive MRSA colonization surveillance program. It is not intended to diagnose MRSA infection nor to guide or monitor treatment for MRSA infections.    Medical History: Past Medical History  Diagnosis Date  . Anemia   . Diabetes mellitus   . CAD (coronary artery disease)   . Peripheral vascular disease   . Hypertension   . Cancer     colon; s/p colectomy 2007  . Anemia, iron deficiency 01/17/2012  . Hemorrhoids   . Nosebleed   . Stroke   . Cellulitis 03/08/12  Medications:  Prescriptions prior to admission  Medication Sig Dispense Refill Last Dose  . amLODipine (NORVASC) 5 MG tablet Take 5 mg by mouth daily.   01/07/2014 at Unknown time  . aspirin EC 81 MG tablet Take 81 mg by mouth every evening.   Past Week at Unknown time  . cephALEXin (KEFLEX) 500 MG capsule Take 500 mg by mouth 3 (three) times daily.   01/07/2014 at Unknown time  . cholecalciferol (VITAMIN D) 1000 UNITS tablet Take 1,000 Units by mouth daily.   01/07/2014 at Unknown time  . FLUoxetine (PROZAC) 20 MG tablet Take 20 mg by mouth daily.   01/07/2014 at Unknown time  . furosemide (LASIX) 20 MG tablet Take 40 mg by mouth daily.    01/07/2014 at Unknown time  . Insulin Glargine (LANTUS SOLOSTAR) 100 UNIT/ML  Solostar Pen Inject 40 Units into the skin daily at 10 pm. 40 units   Past Week at Unknown time  . levothyroxine (SYNTHROID, LEVOTHROID) 125 MCG tablet Take 125 mcg by mouth. Synthroid ONlY   01/07/2014 at Unknown time  . losartan (COZAAR) 100 MG tablet Take 100 mg by mouth daily.   Past Week at Unknown time  . BD PEN NEEDLE NANO U/F 32G X 4 MM MISC    Taking  . PRODIGY NO CODING BLOOD GLUC test strip    Taking   Scheduled:  . amLODipine  5 mg Oral Daily  . aspirin EC  81 mg Oral QPM  . cefTRIAXone (ROCEPHIN)  IV  1 g Intravenous Q24H  . FLUoxetine  20 mg Oral Daily  . furosemide  20 mg Oral BID  . heparin  5,000 Units Subcutaneous 3 times per day  . insulin aspart  0-5 Units Subcutaneous QHS  . insulin aspart  0-9 Units Subcutaneous TID WC  . insulin glargine  40 Units Subcutaneous QHS  . levothyroxine  125 mcg Oral QAC breakfast  . metroNIDAZOLE  500 mg Oral 3 times per day  . sodium chloride  3 mL Intravenous Q12H  . vancomycin  1,000 mg Intravenous Q24H   Infusions:  . sodium chloride 10 mL/hr at 01/09/14 2108   Assessment: 80yo female -- pharmacy is consulted to dose vancomycin and zosyn for recurrent cellulitis and diabetic ulcerations of her legs with concern for osteomyelitis. Pt is afebrile, WBC 6.3, SCr 1.4 with weight-corrected CrCl ~ 73mL/min.   Goal of Therapy:  Vancomycin trough level 10-15 mcg/ml  Plan:  -Continue vanc 1000mg  IV q24h - Continue Zosyn 3.375g IV q8h - Will check vanc trough tomorrow PM (11/2) - Follow up culture results and renal function  Shivaan Tierno E. Furqan Gosselin, Pharm.D Clinical Pharmacy Resident Pager: [phone] 01/10/2014 2:30 PM

## 2014-01-10 NOTE — Plan of Care (Signed)
Problem: Phase II Progression Outcomes Goal: Temperature < 101 Outcome: Completed/Met Date Met:  01/10/14

## 2014-01-11 DIAGNOSIS — E1122 Type 2 diabetes mellitus with diabetic chronic kidney disease: Secondary | ICD-10-CM

## 2014-01-11 DIAGNOSIS — N189 Chronic kidney disease, unspecified: Secondary | ICD-10-CM

## 2014-01-11 DIAGNOSIS — I10 Essential (primary) hypertension: Secondary | ICD-10-CM

## 2014-01-11 DIAGNOSIS — I739 Peripheral vascular disease, unspecified: Secondary | ICD-10-CM

## 2014-01-11 LAB — CBC WITH DIFFERENTIAL/PLATELET
Basophils Absolute: 0 10*3/uL (ref 0.0–0.1)
Basophils Relative: 1 % (ref 0–1)
EOS PCT: 7 % — AB (ref 0–5)
Eosinophils Absolute: 0.4 10*3/uL (ref 0.0–0.7)
HCT: 30.5 % — ABNORMAL LOW (ref 36.0–46.0)
HEMOGLOBIN: 9.8 g/dL — AB (ref 12.0–15.0)
LYMPHS ABS: 1.3 10*3/uL (ref 0.7–4.0)
LYMPHS PCT: 22 % (ref 12–46)
MCH: 28.1 pg (ref 26.0–34.0)
MCHC: 32.1 g/dL (ref 30.0–36.0)
MCV: 87.4 fL (ref 78.0–100.0)
MONOS PCT: 8 % (ref 3–12)
Monocytes Absolute: 0.4 10*3/uL (ref 0.1–1.0)
NEUTROS ABS: 3.7 10*3/uL (ref 1.7–7.7)
Neutrophils Relative %: 64 % (ref 43–77)
Platelets: 222 10*3/uL (ref 150–400)
RBC: 3.49 MIL/uL — ABNORMAL LOW (ref 3.87–5.11)
RDW: 13.6 % (ref 11.5–15.5)
WBC: 5.9 10*3/uL (ref 4.0–10.5)

## 2014-01-11 LAB — BASIC METABOLIC PANEL
Anion gap: 13 (ref 5–15)
BUN: 38 mg/dL — ABNORMAL HIGH (ref 6–23)
CALCIUM: 8.9 mg/dL (ref 8.4–10.5)
CHLORIDE: 97 meq/L (ref 96–112)
CO2: 24 meq/L (ref 19–32)
CREATININE: 1.44 mg/dL — AB (ref 0.50–1.10)
GFR calc non Af Amer: 33 mL/min — ABNORMAL LOW (ref >90)
GFR, EST AFRICAN AMERICAN: 38 mL/min — AB (ref >90)
Glucose, Bld: 107 mg/dL — ABNORMAL HIGH (ref 70–99)
Potassium: 4.3 mEq/L (ref 3.7–5.3)
SODIUM: 134 meq/L — AB (ref 137–147)

## 2014-01-11 LAB — GLUCOSE, CAPILLARY
GLUCOSE-CAPILLARY: 154 mg/dL — AB (ref 70–99)
GLUCOSE-CAPILLARY: 142 mg/dL — AB (ref 70–99)
Glucose-Capillary: 85 mg/dL (ref 70–99)
Glucose-Capillary: 114 mg/dL — ABNORMAL HIGH (ref 70–99)

## 2014-01-11 LAB — VANCOMYCIN, TROUGH: VANCOMYCIN TR: 24.1 ug/mL — AB (ref 10.0–20.0)

## 2014-01-11 NOTE — Plan of Care (Signed)
Problem: Phase III Progression Outcomes Goal: Temperature < 100 Outcome: Progressing     

## 2014-01-11 NOTE — Plan of Care (Signed)
Problem: Phase II Progression Outcomes Goal: Wound without signs/symptoms of infection, decreasing edema Outcome: Progressing     

## 2014-01-11 NOTE — Evaluation (Signed)
Physical Therapy Evaluation Patient Details Name: Sandra [name] MRN: [id number] DOB: March 02, 1931 Today's Date: 01/11/2014   History of Present Illness    Patient is an 80 year old female with past medical history of diabetes mellitus, chronic ulceration of right lower extremity secondary to peripheral vascular disease who was admitted to the hospitalist service on 10/29 for worsening lower extremity cellulitis for the past few weeks. Patient was started on broad-spectrum antibiotics initially. She was noted to have some mild acute on chronic renal failure as well as a potassium of 5.7.   Clinical Impression  Pt admitted with cellulitis of Right leg. Pt currently with functional mobility limitations due to pain and decreased activity tolerance. Pt was agitated regarding getting out of bed and was reluctant to show mobility status to PT. Pt able to ambulate in room with rollator and supervision of PT. Pt will benefit from skilled PT to increase her independence and safety with mobility in order to return home to [facility].    Follow Up Recommendations Home health PT    Equipment Recommendations  None recommended by PT    Recommendations for Other Services       Precautions / Restrictions Precautions Precautions: Fall Restrictions Weight Bearing Restrictions: No      Mobility  Bed Mobility Overal bed mobility: Independent             General bed mobility comments: Pt able to roll in bed and perform supine to sit without PT assitance using handrails.  Transfers Overall transfer level: Modified independent Equipment used: 4-wheeled walker             General transfer comment: Pt able to use rollator to once standing to transfer.   Ambulation/Gait Ambulation/Gait assistance: Supervision Ambulation Distance (Feet): 15 Feet Assistive device: 4-wheeled walker Gait Pattern/deviations: Decreased stride length   Gait velocity interpretation:  Below normal speed for age/gender General Gait Details: Pt able to ambulate using rollator without assistance. Pt safe ambulating for short distances with supervision and assistance managing lines and leads.   Stairs            Wheelchair Mobility    Modified Rankin (Stroke Patients Only)       Balance                                             Pertinent Vitals/Pain Pain Assessment: 0-10 Pain Score: 2  Pain Location: Right leg, particularly foot Pain Intervention(s): Limited activity within patient's tolerance    Home Living Family/patient expects to be discharged to:: Other (Comment) ([place]) Living Arrangements: Alone Available Help at Discharge: Available 24 hours/day (Staff at [employer]) Type of Home: Apartment Home Access: Other (comment) Management consultant)     Home Layout: One level Home Equipment: Jagual - 4 wheels;Cane - single point;Cane - quad Additional Comments: Pt lives in an independent living community. Lives on the 3rd floor with elevator access but has a long walk down the hall from her apartment to the [place] and walk from elevator to dining hall. Performs this ambulation twice a day with rollator. Uses canes around her apartment.    Prior Function Level of Independence: Independent         Comments: Dining hall at facility; Cleaning service comes in once a week     Hand Dominance   Dominant Hand: Right  Extremity/Trunk Assessment               Lower Extremity Assessment: Overall WFL for tasks assessed         Communication   Communication: No difficulties  Cognition Arousal/Alertness: Awake/alert Behavior During Therapy: Agitated                        General Comments General comments (skin integrity, edema, etc.): Pt had severe discomfort with touch to Right leg. Pt did not want to get out of bed but was educated on why her mobility needed to be assisted.      Exercises        Assessment/Plan    PT Assessment Patient needs continued PT services  PT Diagnosis Difficulty walking   PT Problem List Decreased activity tolerance;Decreased mobility;Decreased balance  PT Treatment Interventions DME instruction;Gait training;Functional mobility training;Therapeutic exercise;Balance training;Therapeutic activities   PT Goals (Current goals can be found in the Care Plan section) Acute Rehab PT Goals Patient Stated Goal: Return to previous living situation PT Goal Formulation: With patient Time For Goal Achievement: 01/25/14 Potential to Achieve Goals: Good    Frequency Min 3X/week   Barriers to discharge        Co-evaluation               End of Session   Activity Tolerance: Patient limited by pain Patient left: in bed;with call bell/phone within reach;with bed alarm set      Functional Assessment Tool Used: Clinical Judgement Functional Limitation: Mobility: Walking and moving around Mobility: Walking and Moving Around Current Status ([id number]): At least 20 percent but less than 40 percent impaired, limited or restricted Mobility: Walking and Moving Around Goal Status [phone]): At least 1 percent but less than 20 percent impaired, limited or restricted    Time: 1037-1108 PT Time Calculation (min): 31 min   Charges:   PT Evaluation $Initial PT Evaluation Tier I: 1 Procedure PT Treatments $Gait Training: 8-22 mins   PT G Codes:   Functional Assessment Tool Used: Clinical Judgement Functional Limitation: Mobility: Walking and moving around    [place], [place] F, SPT 01/11/2014, 1:20 PM

## 2014-01-11 NOTE — Progress Notes (Addendum)
Dr. Karleen Hampshire paged to clarify if patient should be NPO for angiogram plan today.  0845 Dr. Karleen Hampshire clarified that she will come to evaluate patient and okay for patient to have breakfast. No new orders received.

## 2014-01-11 NOTE — Progress Notes (Signed)
PROGRESS NOTE  Sandra [name] FFM:[id number] DOB: [birth year] DOA: 01/07/2014 PCP:  Melinda Crutch, MD  HPI Patient is an 80 year old female with past medical history of diabetes mellitus, chronic ulceration of right lower extremity secondary to peripheral vascular disease who was admitted to the hospitalist service on 10/29 for worsening lower extremity cellulitis for the past few weeks. Patient was started on broad-spectrum antibiotics initially. She was noted to have some mild acute on chronic renal failure as well as a potassium of 5.7.  She is pleasant and reports feeling better. IR consulted for angiogram, recommended we should talk to Dr Scot Dock for angiogram, as she followed up with vascular surgery for similar complaints in the past. Speaking with the patient, she is not sure if she wants a repeat angiogram, when she does not want to go ahead with the revascularization surgery in the future. She wants some time to think about.   Assessment/Plan:    Cellulitis of leg, right:  antibiotic suggested to by mouth Flagyl, IV Rocephin and IV vancomycin. Wound care recommendations appreciated. Cellulitis mildly improved.Marland Kitchen Resume IV antibiotics.   Active Problems:   DM type 2 causing renal disease: A1c Notes excellent control. With hypoglycemia, will change frequency of sliding scale. CBG (last 3)   Recent Labs  01/10/14 2157 01/11/14 0810 01/11/14 1154  GLUCAP 132* 85 114*        Anemia, iron deficiency: Stable    PVD (peripheral vascular disease): Patient already followed by vascular surgery. In review of the note 3 months ago, they were considering follow-up angiogram to determine if her vascular insufficiency had worsened. For now, would favor treating cellulitis and she would think about the angiogram and surgery and let us know where she stands.  .     Morbid obesity: Patient meets criteria with BMI greater than 40. Hyperkalemia and Acute renal failure in the setting of stage III  chronic kidney disease: Improved with gentle IV fluids. Hyperkalemia resolved as well    Essential hypertension: Blood pressure stable    Hypothyroidism: Stable continued on Synthroid.    Depression: Continue on Celexa    History of colon cancer   History of colectomy   Code Status: Full code  Family Communication: None present, to update son soon .   Disposition Plan: pending PT eval. Possibly home, but she reports she needs a lot of help at home.    Consultants:  Wound care  Procedures:  None  Antibiotics:  IV Zosyn 10/29-10/30  IV vancomycin 10/29-present  IV Rocephin 10/30-present  By mouth Flagyl 10/30-present   Objective: BP 142/42 mmHg  Pulse 62  Temp(Src) 98.3 F (36.8 C) (Oral)  Resp 20  Ht 5\' 1"  (1.549 m)  Wt 91.8 kg (202 lb 6.1 oz)  BMI 38.26 kg/m2  SpO2 92%  Intake/Output Summary (Last 24 hours) at 01/11/14 1238 Last data filed at 01/11/14 1053  Gross per 24 hour  Intake    558 ml  Output      0 ml  Net    558 ml   Filed Weights   01/08/14 0500 01/10/14 0630 01/11/14 0500  Weight: 92.5 kg (203 lb 14.8 oz) 92.6 kg (204 lb 2.3 oz) 91.8 kg (202 lb 6.1 oz)    Exam:   General:  Alert and oriented 3, no acute distress  Cardiovascular: Regular rate and rhythm, T7-S1, 2/6 systolic ejection murmur  Respiratory: Clear to auscultation bilaterally  Abdomen: Soft, nontender, nondistended, positive bowel sounds  Musculoskeletal: No clubbing or  cyanosis, right lower extremity is persistent erythematous with 1-2 plus edema from the knee down along with tenderness and areas of dryness -erythema about the same since yesterday  Data Reviewed: Basic Metabolic Panel:  Recent Labs Lab 01/07/14 1655 01/07/14 2140 01/08/14 0425 01/09/14 0619  NA 137  --  137 138  K 5.7*  --  4.7 4.6  CL 99  --  102 104  CO2 25  --  23 21  GLUCOSE 184*  --  173* 101*  BUN 56*  --  44* 37*  CREATININE 1.60* 1.40* 1.35* 1.41*  CALCIUM 9.4  --  8.8 8.7    Liver Function Tests:  Recent Labs Lab 01/08/14 0425  AST 17  ALT 12  ALKPHOS 102  BILITOT 0.4  PROT 6.3  ALBUMIN 2.7*   No results for input(s): LIPASE, AMYLASE in the last 168 hours. No results for input(s): AMMONIA in the last 168 hours. CBC:  Recent Labs Lab 01/07/14 1655 01/07/14 2140 01/08/14 0425 01/09/14 0619 01/10/14 0540 01/11/14 0522  WBC 10.7* 8.3 8.3 6.4 6.3 5.9  NEUTROABS 8.2*  --  6.2 4.5 4.1 3.7  HGB 10.3* 10.2* 9.7* 9.5* 9.8* 9.8*  HCT 32.2* 30.7* 30.1* 29.5* 30.4* 30.5*  MCV 90.4 87.0 87.0 88.1 88.4 87.4  PLT 250 248 239 204 216 222   Cardiac Enzymes:   No results for input(s): CKTOTAL, CKMB, CKMBINDEX, TROPONINI in the last 168 hours. BNP (last 3 results) No results for input(s): PROBNP in the last 8760 hours. CBG:  Recent Labs Lab 01/10/14 1140 01/10/14 1738 01/10/14 2157 01/11/14 0810 01/11/14 1154  GLUCAP 96 168* 132* 85 114*    Recent Results (from the past 240 hour(s))  Culture, blood (routine x 2)     Status: None (Preliminary result)   Collection Time: 01/07/14  4:30 PM  Result Value Ref Range Status   Specimen Description BLOOD RIGHT HAND  Final   Special Requests BOTTLES DRAWN AEROBIC ONLY 3CC  Final   Culture  Setup Time   Final    01/07/2014 22:58 Performed at [facility]   Culture   Final           BLOOD CULTURE RECEIVED NO GROWTH TO DATE CULTURE WILL BE HELD FOR 5 DAYS BEFORE ISSUING A FINAL NEGATIVE REPORT Performed at [facility]   Report Status PENDING  Incomplete  Culture, blood (routine x 2)     Status: None (Preliminary result)   Collection Time: 01/07/14  4:45 PM  Result Value Ref Range Status   Specimen Description BLOOD RIGHT WRIST  Final   Special Requests BOTTLES DRAWN AEROBIC AND ANAEROBIC 5CC EACH  Final   Culture  Setup Time   Final    01/07/2014 22:58 Performed at [facility]   Culture   Final           BLOOD CULTURE RECEIVED NO GROWTH TO DATE CULTURE WILL BE HELD FOR 5  DAYS BEFORE ISSUING A FINAL NEGATIVE REPORT Performed at [facility]   Report Status PENDING  Incomplete  MRSA PCR Screening     Status: None   Collection Time: 01/07/14 10:08 PM  Result Value Ref Range Status   MRSA by PCR NEGATIVE NEGATIVE Final    Comment:        The GeneXpert MRSA Assay (FDA approved for NASAL specimens only), is one component of a comprehensive MRSA colonization surveillance program. It is not intended to diagnose MRSA infection nor to guide or  monitor treatment for MRSA infections.     Studies: No results found.  Scheduled Meds: . amLODipine  5 mg Oral Daily  . aspirin EC  81 mg Oral QPM  . cefTRIAXone (ROCEPHIN)  IV  1 g Intravenous Q24H  . FLUoxetine  20 mg Oral Daily  . furosemide  20 mg Oral BID  . heparin  5,000 Units Subcutaneous 3 times per day  . insulin aspart  0-5 Units Subcutaneous QHS  . insulin aspart  0-9 Units Subcutaneous TID WC  . insulin glargine  40 Units Subcutaneous QHS  . levothyroxine  125 mcg Oral QAC breakfast  . metroNIDAZOLE  500 mg Oral 3 times per day  . sodium chloride  3 mL Intravenous Q12H  . vancomycin  1,000 mg Intravenous Q24H    Continuous Infusions: . sodium chloride 10 mL/hr at 01/09/14 2108     Time spent: 25 min  [place] [facility] Pager [phone] . If 7PM-7AM, please contact night-coverage at [email or web address], password [facility] 01/11/2014, 12:38 PM  LOS: 4 days

## 2014-01-11 NOTE — Plan of Care (Signed)
Problem: Phase III Progression Outcomes Goal: Activity at appropriate level-compared to baseline (UP IN CHAIR FOR HEMODIALYSIS)  Outcome: Progressing     

## 2014-01-11 NOTE — Progress Notes (Signed)
Pt BP 162/46. Donnal Debar, NP paged. Will continue to monitor pt.

## 2014-01-12 ENCOUNTER — Inpatient Hospital Stay (HOSPITAL_COMMUNITY): Payer: Medicare Other

## 2014-01-12 ENCOUNTER — Ambulatory Visit: Payer: Medicare Other | Admitting: Podiatry

## 2014-01-12 LAB — CBC WITH DIFFERENTIAL/PLATELET
BASOS ABS: 0.1 10*3/uL (ref 0.0–0.1)
BASOS PCT: 1 % (ref 0–1)
EOS ABS: 0.4 10*3/uL (ref 0.0–0.7)
Eosinophils Relative: 7 % — ABNORMAL HIGH (ref 0–5)
HCT: 32.4 % — ABNORMAL LOW (ref 36.0–46.0)
HEMOGLOBIN: 10.4 g/dL — AB (ref 12.0–15.0)
Lymphocytes Relative: 17 % (ref 12–46)
Lymphs Abs: 1 10*3/uL (ref 0.7–4.0)
MCH: 28.1 pg (ref 26.0–34.0)
MCHC: 32.1 g/dL (ref 30.0–36.0)
MCV: 87.6 fL (ref 78.0–100.0)
Monocytes Absolute: 0.6 10*3/uL (ref 0.1–1.0)
Monocytes Relative: 10 % (ref 3–12)
NEUTROS ABS: 4 10*3/uL (ref 1.7–7.7)
Neutrophils Relative %: 65 % (ref 43–77)
PLATELETS: 234 10*3/uL (ref 150–400)
RBC: 3.7 MIL/uL — ABNORMAL LOW (ref 3.87–5.11)
RDW: 13.6 % (ref 11.5–15.5)
WBC: 6.1 10*3/uL (ref 4.0–10.5)

## 2014-01-12 LAB — GLUCOSE, CAPILLARY
GLUCOSE-CAPILLARY: 81 mg/dL (ref 70–99)
Glucose-Capillary: 100 mg/dL — ABNORMAL HIGH (ref 70–99)
Glucose-Capillary: 122 mg/dL — ABNORMAL HIGH (ref 70–99)
Glucose-Capillary: 129 mg/dL — ABNORMAL HIGH (ref 70–99)

## 2014-01-12 MED ORDER — LORAZEPAM 0.5 MG PO TABS
0.5000 mg | ORAL_TABLET | Freq: Once | ORAL | Status: AC
  Administered 2014-01-12: 0.5 mg via ORAL
  Filled 2014-01-12: qty 1

## 2014-01-12 MED ORDER — VANCOMYCIN HCL 500 MG IV SOLR
500.0000 mg | INTRAVENOUS | Status: DC
  Administered 2014-01-12 – 2014-01-13 (×2): 500 mg via INTRAVENOUS
  Filled 2014-01-12 (×3): qty 500

## 2014-01-12 NOTE — Progress Notes (Signed)
ANTIBIOTIC CONSULT NOTE  Pharmacy Consult for Vancomycin Indication: cellulitis  Allergies  Allergen Reactions  . Blackberry [Rubus Fruticosus]     Can't take because of Cancer  . Meloxicam Shortness Of Breath  . Procaine Hcl Swelling  . Meloxicam     Patient Measurements: Height: 5\' 1"  (154.9 cm) Weight: 202 lb 6.1 oz (91.8 kg) IBW/kg (Calculated) : 47.8 Adjusted Body Weight: 65 kg  Vital Signs: Temp: 98 F (36.7 C) (11/02 1519) Temp Source: Oral (11/02 1519) BP: 151/43 mmHg (11/02 1519) Pulse Rate: 64 (11/02 1519) Intake/Output from previous day: 11/02 0701 - 11/03 0700 In: 1051 [P.O.:796; I.V.:105; IV Piggyback:150] Out: 450 [Urine:450] Intake/Output from this shift: Total I/O In: -  Out: 450 [Urine:450]  Labs:  Recent Labs  01/09/14 0619 01/10/14 0540 01/11/14 0522 01/11/14 2027  WBC 6.4 6.3 5.9  --   HGB 9.5* 9.8* 9.8*  --   PLT 204 216 222  --   CREATININE 1.41*  --   --  1.44*   Estimated Creatinine Clearance: 31.6 mL/min (by C-G formula based on Cr of 1.44).  Recent Labs  01/11/14 2027  VANCOTROUGH 24.1*     Microbiology: Recent Results (from the past 720 hour(s))  Culture, blood (routine x 2)     Status: None (Preliminary result)   Collection Time: 01/07/14  4:30 PM  Result Value Ref Range Status   Specimen Description BLOOD RIGHT HAND  Final   Special Requests BOTTLES DRAWN AEROBIC ONLY 3CC  Final   Culture  Setup Time   Final    01/07/2014 22:58 Performed at [facility]   Culture   Final           BLOOD CULTURE RECEIVED NO GROWTH TO DATE CULTURE WILL BE HELD FOR 5 DAYS BEFORE ISSUING A FINAL NEGATIVE REPORT Performed at [facility]   Report Status PENDING  Incomplete  Culture, blood (routine x 2)     Status: None (Preliminary result)   Collection Time: 01/07/14  4:45 PM  Result Value Ref Range Status   Specimen Description BLOOD RIGHT WRIST  Final   Special Requests BOTTLES DRAWN AEROBIC AND ANAEROBIC 5CC EACH   Final   Culture  Setup Time   Final    01/07/2014 22:58 Performed at [facility]   Culture   Final           BLOOD CULTURE RECEIVED NO GROWTH TO DATE CULTURE WILL BE HELD FOR 5 DAYS BEFORE ISSUING A FINAL NEGATIVE REPORT Performed at [facility]   Report Status PENDING  Incomplete  MRSA PCR Screening     Status: None   Collection Time: 01/07/14 10:08 PM  Result Value Ref Range Status   MRSA by PCR NEGATIVE NEGATIVE Final    Comment:        The GeneXpert MRSA Assay (FDA approved for NASAL specimens only), is one component of a comprehensive MRSA colonization surveillance program. It is not intended to diagnose MRSA infection nor to guide or monitor treatment for MRSA infections.   Assessment: 80yo female with RLE cellulitis for empiric antibiotics  Goal of Therapy:  Vancomycin trough level 10-15 mcg/ml  Plan:  Change vancomycin 500 mg IV q24h  Phillis Knack, PharmD, BCPS  01/12/2014 12:28 AM

## 2014-01-12 NOTE — Progress Notes (Signed)
PROGRESS NOTE  Sandra [name] JAS:[id number] DOB: [birth year] DOA: 01/07/2014 PCP:  Melinda Crutch, MD  HPI Patient is an 80 year old female with past medical history of diabetes mellitus, chronic ulceration of right lower extremity secondary to peripheral vascular disease who was admitted to the hospitalist service on 10/29 for worsening lower extremity cellulitis for the past few weeks. Patient was started on broad-spectrum antibiotics initially. She was noted to have some mild acute on chronic renal failure as well as a potassium of 5.7.  She is pleasant and reports feeling better. IR consulted for angiogram, recommended we should talk to Dr Scot Dock for angiogram, as she followed up with vascular surgery for similar complaints in the past. Speaking with the patient, she is not sure if she wants a repeat angiogram, when she does not want to go ahead with the revascularization surgery in the future. Spoke to the patient again today about the angiogram, she has politely refused to undergo any testing for vascular insufficiency. She reported left shoulder pain and unable to move the left shoulder and left arm.  Assessment/Plan:    Cellulitis of leg, right:  DAY 5 of  Flagyl, IV Rocephin and IV vancomycin. Wound care recommendations appreciated. Cellulitis mildly improved.. Plan to de escalate the antibiotics in am, as she will have completed 5 days of IV antibiotics.   Active Problems:   DM type 2 causing renal disease: A1c Notes excellent control. With hypoglycemia, will change frequency of sliding scale. CBG (last 3)   Recent Labs  01/11/14 2209 01/12/14 0803 01/12/14 1156  GLUCAP 142* 81 100*        Anemia, iron deficiency: Stable    PVD (peripheral vascular disease): Patient already followed by vascular surgery. In review of the note 3 months ago, they were considering follow-up angiogram to determine if her vascular insufficiency had worsened. For now, would favor treating cellulitis and  she does nto want to go ahead with the angiogram.      Morbid obesity: Patient meets criteria with BMI greater than 40. Hyperkalemia and Acute renal failure in the setting of stage III chronic kidney disease: Improved with gentle IV fluids. Hyperkalemia resolved as well    Essential hypertension: Blood pressure stable    Hypothyroidism: Stable continued on Synthroid.    Depression: Continue on Celexa    History of colon cancer   History of colectomy  Left shoulder pain and unable to move left arm: MRI of the left shoulder for evaluation of the rotater cuff. Pain control and PT eval.    Code Status: Full code  Family Communication: None present, to update son soon .   Disposition Plan: pending PT eval. Possibly home, but she reports she needs a lot of help at home.    Consultants:  Wound care  Procedures:  None  Antibiotics:  IV Zosyn 10/29-10/30  IV vancomycin 10/29-present  IV Rocephin 10/30-present  By mouth Flagyl 10/30-present   Objective: BP 145/42 mmHg  Pulse 57  Temp(Src) 98.1 F (36.7 C) (Oral)  Resp 15  Ht 5\' 1"  (1.549 m)  Wt 87.952 kg (193 lb 14.4 oz)  BMI 36.66 kg/m2  SpO2 94%  Intake/Output Summary (Last 24 hours) at 01/12/14 1611 Last data filed at 01/12/14 1357  Gross per 24 hour  Intake   1233 ml  Output   1050 ml  Net    183 ml   Filed Weights   01/10/14 0630 01/11/14 0500 01/12/14 0500  Weight: 92.6 kg (204 lb 2.3  oz) 91.8 kg (202 lb 6.1 oz) 87.952 kg (193 lb 14.4 oz)    Exam:   General:  Alert and oriented 3, no acute distress  Cardiovascular: Regular rate and rhythm, J2-E2, 2/6 systolic ejection murmur  Respiratory: Clear to auscultation bilaterally  Abdomen: Soft, nontender, nondistended, positive bowel sounds  Musculoskeletal: No clubbing or cyanosis, right lower extremity is persistent erythematous with 1-2 plus edema from the knee down along with tenderness and areas of dryness -erythema improved. . Left shoulder  tenderness on movement.   SKIN: stage 2  Right buttock ulcer  Data Reviewed: Basic Metabolic Panel:  Recent Labs Lab 01/07/14 1655 01/07/14 2140 01/08/14 0425 01/09/14 0619 01/11/14 2027  NA 137  --  137 138 134*  K 5.7*  --  4.7 4.6 4.3  CL 99  --  102 104 97  CO2 25  --  23 21 24   GLUCOSE 184*  --  173* 101* 107*  BUN 56*  --  44* 37* 38*  CREATININE 1.60* 1.40* 1.35* 1.41* 1.44*  CALCIUM 9.4  --  8.8 8.7 8.9   Liver Function Tests:  Recent Labs Lab 01/08/14 0425  AST 17  ALT 12  ALKPHOS 102  BILITOT 0.4  PROT 6.3  ALBUMIN 2.7*   No results for input(s): LIPASE, AMYLASE in the last 168 hours. No results for input(s): AMMONIA in the last 168 hours. CBC:  Recent Labs Lab 01/08/14 0425 01/09/14 0619 01/10/14 0540 01/11/14 0522 01/12/14 0625  WBC 8.3 6.4 6.3 5.9 6.1  NEUTROABS 6.2 4.5 4.1 3.7 4.0  HGB 9.7* 9.5* 9.8* 9.8* 10.4*  HCT 30.1* 29.5* 30.4* 30.5* 32.4*  MCV 87.0 88.1 88.4 87.4 87.6  PLT 239 204 216 222 234   Cardiac Enzymes:   No results for input(s): CKTOTAL, CKMB, CKMBINDEX, TROPONINI in the last 168 hours. BNP (last 3 results) No results for input(s): PROBNP in the last 8760 hours. CBG:  Recent Labs Lab 01/11/14 1154 01/11/14 1656 01/11/14 2209 01/12/14 0803 01/12/14 1156  GLUCAP 114* 154* 142* 81 100*    Recent Results (from the past 240 hour(s))  Culture, blood (routine x 2)     Status: None (Preliminary result)   Collection Time: 01/07/14  4:30 PM  Result Value Ref Range Status   Specimen Description BLOOD RIGHT HAND  Final   Special Requests BOTTLES DRAWN AEROBIC ONLY 3CC  Final   Culture  Setup Time   Final    01/07/2014 22:58 Performed at [facility]   Culture   Final           BLOOD CULTURE RECEIVED NO GROWTH TO DATE CULTURE WILL BE HELD FOR 5 DAYS BEFORE ISSUING A FINAL NEGATIVE REPORT Performed at [facility]   Report Status PENDING  Incomplete  Culture, blood (routine x 2)     Status: None  (Preliminary result)   Collection Time: 01/07/14  4:45 PM  Result Value Ref Range Status   Specimen Description BLOOD RIGHT WRIST  Final   Special Requests BOTTLES DRAWN AEROBIC AND ANAEROBIC 5CC EACH  Final   Culture  Setup Time   Final    01/07/2014 22:58 Performed at [facility]   Culture   Final           BLOOD CULTURE RECEIVED NO GROWTH TO DATE CULTURE WILL BE HELD FOR 5 DAYS BEFORE ISSUING A FINAL NEGATIVE REPORT Performed at [facility]   Report Status PENDING  Incomplete  MRSA PCR Screening  Status: None   Collection Time: 01/07/14 10:08 PM  Result Value Ref Range Status   MRSA by PCR NEGATIVE NEGATIVE Final    Comment:        The GeneXpert MRSA Assay (FDA approved for NASAL specimens only), is one component of a comprehensive MRSA colonization surveillance program. It is not intended to diagnose MRSA infection nor to guide or monitor treatment for MRSA infections.  Wound culture     Status: None (Preliminary result)   Collection Time: 01/10/14 11:45 PM  Result Value Ref Range Status   Specimen Description WOUND RIGHT LEG  Final   Special Requests NONE  Final   Gram Stain   Final    RARE WBC PRESENT, PREDOMINANTLY PMN NO SQUAMOUS EPITHELIAL CELLS SEEN NO ORGANISMS SEEN Performed at [facility]    Culture   Final    FEW STAPHYLOCOCCUS AUREUS Note: RIFAMPIN AND GENTAMICIN SHOULD NOT BE USED AS SINGLE DRUGS FOR TREATMENT OF STAPH INFECTIONS. Performed at [facility]    Report Status PENDING  Incomplete     Studies: No results found.  Scheduled Meds: . aspirin EC  81 mg Oral QPM  . cefTRIAXone (ROCEPHIN)  IV  1 g Intravenous Q24H  . FLUoxetine  20 mg Oral Daily  . furosemide  20 mg Oral BID  . heparin  5,000 Units Subcutaneous 3 times per day  . insulin aspart  0-5 Units Subcutaneous QHS  . insulin aspart  0-9 Units Subcutaneous TID WC  . insulin glargine  40 Units Subcutaneous QHS  . levothyroxine  125 mcg Oral QAC  breakfast  . metroNIDAZOLE  500 mg Oral 3 times per day  . sodium chloride  3 mL Intravenous Q12H  . vancomycin  500 mg Intravenous Q24H    Continuous Infusions: . sodium chloride 10 mL/hr at 01/11/14 1510     Time spent: 25 min  [place] [facility] Pager [phone] . If 7PM-7AM, please contact night-coverage at [email or web address], password [facility] 01/12/2014, 4:11 PM  LOS: 5 days

## 2014-01-12 NOTE — Clinical Documentation Improvement (Signed)
Nursing has documented in assessment flow sheet on 01/09/14 presence of pressure ulcer located on right buttock cheek stage II.  No mention of buttock ulcer in physician notes nor WOC consult note 01/08/14.  Please document in your progress note and carry over to the discharge summary if you agree with nursing assessment regarding presence of right buttock ulcer stage II and indicate if this was present on admission.  Possible Clinical Conditions: -Right buttock pressure ulcer stage II -Right buttock pressure ulcer - other stage (please specify) -Other condition (please specify) -Unable to determine at present  Thank you, Mateo Flow, RN [phone] Clinical Documentation Specialist

## 2014-01-13 ENCOUNTER — Inpatient Hospital Stay (HOSPITAL_COMMUNITY): Payer: Medicare Other

## 2014-01-13 DIAGNOSIS — E11621 Type 2 diabetes mellitus with foot ulcer: Secondary | ICD-10-CM

## 2014-01-13 DIAGNOSIS — L089 Local infection of the skin and subcutaneous tissue, unspecified: Secondary | ICD-10-CM

## 2014-01-13 DIAGNOSIS — E1129 Type 2 diabetes mellitus with other diabetic kidney complication: Secondary | ICD-10-CM

## 2014-01-13 DIAGNOSIS — E11628 Type 2 diabetes mellitus with other skin complications: Secondary | ICD-10-CM | POA: Insufficient documentation

## 2014-01-13 DIAGNOSIS — L03115 Cellulitis of right lower limb: Secondary | ICD-10-CM | POA: Insufficient documentation

## 2014-01-13 DIAGNOSIS — E875 Hyperkalemia: Secondary | ICD-10-CM

## 2014-01-13 DIAGNOSIS — I70209 Unspecified atherosclerosis of native arteries of extremities, unspecified extremity: Secondary | ICD-10-CM | POA: Insufficient documentation

## 2014-01-13 DIAGNOSIS — L98499 Non-pressure chronic ulcer of skin of other sites with unspecified severity: Secondary | ICD-10-CM

## 2014-01-13 DIAGNOSIS — L97519 Non-pressure chronic ulcer of other part of right foot with unspecified severity: Secondary | ICD-10-CM

## 2014-01-13 LAB — CBC WITH DIFFERENTIAL/PLATELET
BASOS ABS: 0.1 10*3/uL (ref 0.0–0.1)
Basophils Relative: 1 % (ref 0–1)
EOS ABS: 0.5 10*3/uL (ref 0.0–0.7)
EOS PCT: 7 % — AB (ref 0–5)
HEMATOCRIT: 31.1 % — AB (ref 36.0–46.0)
Hemoglobin: 10.1 g/dL — ABNORMAL LOW (ref 12.0–15.0)
Lymphocytes Relative: 16 % (ref 12–46)
Lymphs Abs: 1.1 10*3/uL (ref 0.7–4.0)
MCH: 28.5 pg (ref 26.0–34.0)
MCHC: 32.5 g/dL (ref 30.0–36.0)
MCV: 87.9 fL (ref 78.0–100.0)
Monocytes Absolute: 0.7 10*3/uL (ref 0.1–1.0)
Monocytes Relative: 10 % (ref 3–12)
Neutro Abs: 4.4 10*3/uL (ref 1.7–7.7)
Neutrophils Relative %: 66 % (ref 43–77)
PLATELETS: 229 10*3/uL (ref 150–400)
RBC: 3.54 MIL/uL — ABNORMAL LOW (ref 3.87–5.11)
RDW: 13.8 % (ref 11.5–15.5)
WBC: 6.7 10*3/uL (ref 4.0–10.5)

## 2014-01-13 LAB — CLOSTRIDIUM DIFFICILE BY PCR: Toxigenic C. Difficile by PCR: NEGATIVE

## 2014-01-13 LAB — CULTURE, BLOOD (ROUTINE X 2)
Culture: NO GROWTH
Culture: NO GROWTH

## 2014-01-13 LAB — GLUCOSE, CAPILLARY
Glucose-Capillary: 75 mg/dL (ref 70–99)
Glucose-Capillary: 113 mg/dL — ABNORMAL HIGH (ref 70–99)
Glucose-Capillary: 152 mg/dL — ABNORMAL HIGH (ref 70–99)
Glucose-Capillary: 115 mg/dL — ABNORMAL HIGH (ref 70–99)

## 2014-01-13 LAB — WOUND CULTURE

## 2014-01-13 MED ORDER — PRO-STAT SUGAR FREE PO LIQD
30.0000 mL | Freq: Three times a day (TID) | ORAL | Status: DC
  Administered 2014-01-13 – 2014-01-14 (×3): 30 mL via ORAL
  Filled 2014-01-13 (×4): qty 30

## 2014-01-13 NOTE — Discharge Summary (Addendum)
Physician Discharge Summary  RAQUELL RICHER YTK:[id number] DOB: 04-20-1930 DOA: 01/07/2014  PCP:  Melinda Crutch, MD  Admit date: 01/07/2014 Discharge date: 01/14/2014  Time spent: 45 minutes  Recommendations for Outpatient Follow-up:  Patient will be discharged home with home health physical therapy. She is to follow-up with her primary care physician upon discharge. Patient should also continue wound care.  She is to also follow up with Dr. Scot Dock.   Patient should follow a heart healthy carb modified diet. Patient should continue taking her medications as prescribed.  Discharge Diagnoses:  Principal Problem:   Cellulitis of leg, right Active Problems:   DM type 2 causing renal disease   Anemia, iron deficiency   PVD (peripheral vascular disease)   Hyperkalemia   Morbid obesity   Essential hypertension   Hypothyroidism   Depression   History of colon cancer   History of colectomy   Diabetic foot infection   Cellulitis of right lower extremity   Atherosclerotic peripheral vascular disease with ulceration   Discharge Condition: Stale  Diet recommendation: Heart healthy/carb modified  Filed Weights   01/12/14 0500 01/13/14 0500 01/14/14 0554  Weight: 87.952 kg (193 lb 14.4 oz) 88 kg (194 lb 0.1 oz) 88.2 kg (194 lb 7.1 oz)    History of present illness:  On 01/07/2014 This is a 80 y.o. year old female with significant past medical history of IDDM, PVD, CVA, chronic diabetic ulcers, colon ca s/p colectomy presenting with cellulitis, hyperkalemia. Before admission, patient saw Dr. Marin Olp for followup, who noticed pt with marked RLE drainage, redness. Per pt she has had worsening pain and redness in RLE over past few weeks. Has chronic LE diabetic ulcers followed by wound care. Mild subjective fevers and chills at home. Currently lives in [facility]. States that sugars have been well controlled per pt. Pt was directed to ER at [facility]. Upon admission, T 98.4, HR 60s-100s,  resp 20s, BP 140s-160s, satting >96% on RA. WBC 10.7, hgb 10.3, Cr 1.6, K 5.7. Glu 184. EKG w. RBBB and LAFB. No peaked T waves. Started on vanc and zosyn. Given po kayexalate x1.   Hospital Course:  Right lower extremity cellulitis -initially placed on Flagyl, Rocephin, vancomycin -infectious disease consultation appreciated -wound care consulted and appreciated -PT recommended home health physical therapy -right foot x-ray showed no definite evidence of osteomyelitis -MRI obtained of the right foot: diffuse cellulitis and myositis but no focal drainable soft tissue abscess, septic arthritis or osteomyelitis -patient will be discharged with one additional week of doxycycline. -Infectious disease recommended patient also follow-up with Dr. Scot Dock, vascular surgeon,  as well as wound care.  Diabetes mellitus, type II -CBGs have remained stable, continue home regimen  Iron deficiency anemia -hemoglobin has remained stable  Peripheral vascular disease -Patient follows up with Dr. Scot Dock as an outpatient -Patient decided she did not want to have an angiogram conducted  Morbid obesity -BMI greater than 40 -Patient should speak with her primary care physician regarding diet and lifestyle modifications upon discharge  Essential hypertension -stable,Continue home regimen  Hypothyroidism -continue Synthroid  Depression -Continue Celexa  History of colon cancer and colectomy -Continue to follow up with oncology as an outpatient  Left shoulder pain -Physical therapy recommended home health -MRI of the left shoulder: clear osteoarthritis of the  Glenohumeral joint with flattening and avascular necrosis of the humeral head, severe degeneration of the supraspinatus tendon with multiple tears without retraction  Hyperkalemia -Resolved.  Procedures:  none  Consultations:  Wound care  Infectious disease  Discharge Exam: Filed Vitals:   01/14/14 0554  BP: 154/36  Pulse: 54   Temp: 98.1 F (36.7 C)  Resp: 18     General: Well developed, well nourished, NAD, appears stated age  HEENT: NCAT, mucous membranes moist.  Cardiovascular: S1 S2 auscultated, 2/6 SEM. Regular rate and rhythm.  Respiratory: Clear to auscultation bilaterally with equal chest rise  Abdomen: Soft, nontender, nondistended, + bowel sounds  Extremities: warm dry without cyanosis clubbing.  Edema RLE ext currently wrapped, some tenderness.  Left shoulder tenderness with movement  Neuro: AAOx3  Skin: Stage 2 sacral decub (right buttock)  Psych: Appropriate mood and affect  Discharge Instructions      Discharge Instructions    Discharge instructions    Complete by:  As directed   Patient will be discharged home with home health physical therapy. She is to follow-up with her primary care physician upon discharge. Patient should also continue wound care.  She is to also follow up with Dr. Scot Dock.   Patient should follow a heart healthy carb modified diet. Patient should continue taking her medications as prescribed.            Medication List    STOP taking these medications        cephALEXin 500 MG capsule  Commonly known as:  KEFLEX      TAKE these medications        amLODipine 5 MG tablet  Commonly known as:  NORVASC  Take 5 mg by mouth daily.     aspirin EC 81 MG tablet  Take 81 mg by mouth every evening.     BD PEN NEEDLE NANO U/F 32G X 4 MM Misc  Generic drug:  Insulin Pen Needle     cholecalciferol 1000 UNITS tablet  Commonly known as:  VITAMIN D  Take 1,000 Units by mouth daily.     doxycycline 100 MG tablet  Commonly known as:  VIBRA-TABS  Take 1 tablet (100 mg total) by mouth 2 (two) times daily.     feeding supplement (PRO-STAT SUGAR FREE 64) Liqd  Take 30 mLs by mouth 3 (three) times daily with meals.     FLUoxetine 20 MG tablet  Commonly known as:  PROZAC  Take 20 mg by mouth daily.     furosemide 20 MG tablet  Commonly known as:  LASIX    Take 40 mg by mouth daily.     LANTUS SOLOSTAR 100 UNIT/ML Solostar Pen  Generic drug:  Insulin Glargine  Inject 40 Units into the skin daily at 10 pm. 40 units     levothyroxine 125 MCG tablet  Commonly known as:  SYNTHROID, LEVOTHROID  Take 125 mcg by mouth. Synthroid ONlY     losartan 100 MG tablet  Commonly known as:  COZAAR  Take 100 mg by mouth daily.     PRODIGY NO CODING BLOOD GLUC test strip  Generic drug:  glucose blood       Allergies  Allergen Reactions  . Blackberry [Rubus Fruticosus]     Can't take because of Cancer  . Meloxicam Shortness Of Breath  . Procaine Hcl Swelling  . Meloxicam    Follow-up Information    Follow up with  Melinda Crutch, MD. Go in 1 week.   Specialty:  Family Medicine   Why:  Hospital followup   Contact information:   [address] [place] [address] [phone]        The results of  significant diagnostics from this hospitalization (including imaging, microbiology, ancillary and laboratory) are listed below for reference.    Significant Diagnostic Studies: Mr Shoulder Left Wo Contrast  01/13/2014   CLINICAL DATA:  Left shoulder pain with inability to move the shoulder and arm.  EXAM: MRI OF THE LEFT SHOULDER WITHOUT CONTRAST  TECHNIQUE: Multiplanar, multisequence MR imaging of the shoulder was performed. No intravenous contrast was administered.  COMPARISON:  None.  FINDINGS: Rotator cuff: The supraspinatus tendon is severely degenerated with an intrasubstance tear at the anterior aspect of the distal tendon and a 60% thickness bursal surface tear at the musculotendinous junction.  The other tendons of the rotator cuff are intact.  Muscles: There is moderately severe atrophy of the infraspinatus and supraspinatus and teres minor muscles with moderate atrophy of the subscapularis muscle.  Biceps long head:  Properly located and intact.  Acromioclavicular Joint: Moderate arthropathy. Normal type 2 acromion.  Glenohumeral Joint:  Severe osteoarthritis with diffuse full-thickness cartilage loss on the glenoid and humeral head. Flattening and subcortical edema and focal avascular necrosis of the humeral head is present. Moderate joint effusion.  Labrum: Diffusely degenerated. Motion artifact limits assessment of the degenerated labrum but I see no discrete tear.  Bones:  The extra-articular bones are normal.  IMPRESSION: 1. Severe osteoarthritis of the glenohumeral joint with flattening and avascular necrosis of the humeral head. 2. Severe degeneration of the supraspinatus tendon with multiple tears without retraction.   Electronically Signed   By: Rozetta Nunnery M.D.   On: 01/13/2014 09:24   Mri Right Foot Without Contrast  01/14/2014   CLINICAL DATA:  Chronic lower extremity diabetic ulcers. Cellulitis and hyperkalemia.  EXAM: MRI OF THE RIGHT FOREFOOT WITHOUT CONTRAST  TECHNIQUE: Multiplanar, multisequence MR imaging was performed. No intravenous contrast was administered.  COMPARISON:  Radiographs 01/13/2014  FINDINGS: Diffuse subcutaneous soft tissue swelling/ edema suggesting cellulitis. There is also diffuse myositis. No focal fluid collections to suggest an abscess. Midfoot, forefoot and hindfoot degenerative changes but no stress fracture or evidence of avascular necrosis. The major tendons and ligaments are intact.  No findings to suggest septic arthritis or osteomyelitis.  IMPRESSION: Diffuse cellulitis and myositis but no focal drainable soft tissue abscess, septic arthritis or osteomyelitis.   Electronically Signed   By: Kalman Jewels M.D.   On: 01/14/2014 15:17   Dg Foot Complete Right  01/13/2014   CLINICAL DATA:  Diabetic foot ulcer with pain.  EXAM: RIGHT FOOT COMPLETE - 3+ VIEW  COMPARISON:  None.  FINDINGS: There is mild diffuse decreased bone mineralization. Moderate hallux valgus deformity is present. There are mild degenerative changes of the midfoot. There are moderate degenerative changes of the first and second  tarsal metatarsal joints. Mild degenerative change of the first MTP joint. No fracture or dislocation. No definite bone destruction to suggest osteomyelitis. Small vessel atherosclerotic disease is present.  IMPRESSION: No definite evidence of osteomyelitis.  Degenerative changes as described with moderate hallux valgus deformity.   Electronically Signed   By: Marin Olp M.D.   On: 01/13/2014 10:54    Microbiology: Recent Results (from the past 240 hour(s))  Culture, blood (routine x 2)     Status: None   Collection Time: 01/07/14  4:30 PM  Result Value Ref Range Status   Specimen Description BLOOD RIGHT HAND  Final   Special Requests BOTTLES DRAWN AEROBIC ONLY 3CC  Final   Culture  Setup Time   Final    01/07/2014 22:58 Performed at [facility]    Culture   Final    NO GROWTH 5 DAYS Performed at [facility]    Report Status 01/13/2014 FINAL  Final  Culture, blood (routine x 2)     Status: None   Collection Time: 01/07/14  4:45 PM  Result Value Ref Range Status   Specimen Description BLOOD RIGHT WRIST  Final   Special Requests BOTTLES DRAWN AEROBIC AND ANAEROBIC 5CC EACH  Final   Culture  Setup Time   Final    01/07/2014 22:58 Performed at [facility]   Final    NO GROWTH 5 DAYS Performed at [facility]    Report Status 01/13/2014 FINAL  Final  MRSA PCR Screening     Status: None   Collection Time: 01/07/14 10:08 PM  Result Value Ref Range Status   MRSA by PCR NEGATIVE NEGATIVE Final    Comment:        The GeneXpert MRSA Assay (FDA approved for NASAL specimens only), is one component of a comprehensive MRSA colonization surveillance program. It is not intended to diagnose MRSA infection nor to guide or monitor treatment for MRSA infections.  Wound culture     Status: None   Collection Time: 01/10/14 11:45 PM  Result Value Ref Range Status   Specimen Description WOUND RIGHT LEG  Final   Special Requests NONE  Final    Gram Stain   Final    RARE WBC PRESENT, PREDOMINANTLY PMN NO SQUAMOUS EPITHELIAL CELLS SEEN NO ORGANISMS SEEN Performed at [facility]    Culture   Final    FEW METHICILLIN RESISTANT STAPHYLOCOCCUS AUREUS Note: RIFAMPIN AND GENTAMICIN SHOULD NOT BE USED AS SINGLE DRUGS FOR TREATMENT OF STAPH INFECTIONS. This organism DOES NOT demonstrate inducible Clindamycin resistance in vitro. CRITICAL RESULT CALLED TO, READ BACK BY AND VERIFIED WITH: LINDA B @ 830 AM  01/13/14 BY MORAC Performed at [facility]    Report Status 01/13/2014 FINAL  Final   Organism ID, Bacteria METHICILLIN RESISTANT STAPHYLOCOCCUS AUREUS  Final      Susceptibility   Methicillin resistant staphylococcus aureus - MIC*    CLINDAMYCIN <=0.25 SENSITIVE Sensitive     ERYTHROMYCIN >=8 RESISTANT Resistant     GENTAMICIN <=0.5 SENSITIVE Sensitive     LEVOFLOXACIN >=8 RESISTANT Resistant     OXACILLIN >=4 RESISTANT Resistant     PENICILLIN >=0.5 RESISTANT Resistant     RIFAMPIN <=0.5 SENSITIVE Sensitive     TRIMETH/SULFA >=320 RESISTANT Resistant     VANCOMYCIN 1 SENSITIVE Sensitive     TETRACYCLINE <=1 SENSITIVE Sensitive     * FEW METHICILLIN RESISTANT STAPHYLOCOCCUS AUREUS  Clostridium Difficile by PCR     Status: None   Collection Time: 01/12/14  7:38 PM  Result Value Ref Range Status   C difficile by pcr NEGATIVE NEGATIVE Final     Labs: Basic Metabolic Panel:  Recent Labs Lab 01/07/14 1655 01/07/14 2140 01/08/14 0425 01/09/14 0619 01/11/14 2027 01/14/14 0728  NA 137  --  137 138 134* 138  K 5.7*  --  4.7 4.6 4.3 4.4  CL 99  --  102 104 97 101  CO2 25  --  23 21 24 23   GLUCOSE 184*  --  173* 101* 107* 51*  BUN 56*  --  44* 37* 38* 42*  CREATININE 1.60* 1.40* 1.35* 1.41* 1.44* 1.45*  CALCIUM 9.4  --  8.8 8.7 8.9 9.4   Liver Function Tests:  Recent Labs Lab 01/08/14 0425 01/14/14 0728  AST 17  --   ALT 12  --   ALKPHOS 102  --   BILITOT 0.4  --   PROT 6.3  --   ALBUMIN 2.7*  2.7*   No results for input(s): LIPASE, AMYLASE in the last 168 hours. No results for input(s): AMMONIA in the last 168 hours. CBC:  Recent Labs Lab 01/10/14 0540 01/11/14 0522 01/12/14 0625 01/13/14 0645 01/14/14 0728  WBC 6.3 5.9 6.1 6.7 6.6  NEUTROABS 4.1 3.7 4.0 4.4 4.4  HGB 9.8* 9.8* 10.4* 10.1* 10.3*  HCT 30.4* 30.5* 32.4* 31.1* 32.0*  MCV 88.4 87.4 87.6 87.9 88.4  PLT 216 222 234 229 213   Cardiac Enzymes: No results for input(s): CKTOTAL, CKMB, CKMBINDEX, TROPONINI in the last 168 hours. BNP: BNP (last 3 results) No results for input(s): PROBNP in the last 8760 hours. CBG:  Recent Labs Lab 01/13/14 1744 01/13/14 2151 01/14/14 0750 01/14/14 0841 01/14/14 1156  GLUCAP 152* 115* 56* 80 140*       Signed:  Dhyana Bastone  [facility] 01/14/2014, 3:36 PM

## 2014-01-13 NOTE — Consult Note (Addendum)
[facility]    Date of Admission:  01/07/2014  Date of Consult:  01/13/2014  Reason for Consult: Diabetic foot ulcer and cellulitis Referring Physician: Dr. Ree Kida   HPI: Sandra [name] is an 80 y.o. female with PMX significant for IDDM, PVD, CVA, colon cancer sp colectomy who had been experiencing worsening redness weaping from RLE over past several weeks. She had been started on cephalexin in mid 12/2013 with some improvement in erythema but she had soaking of drainage from her skin which had been wrapped from feet to ankle in dressing by wound care. Dr Marin Olp was concerned for severe infection and pt was admitted from her clinic. It appears that a superficial culture was taken and this FWIW grew MRSA.   She had been started on vancomycin and zosyn but was then more appropriately narrowed to ceftriaxone, flagyl and vancomycin. She has had improvement in her erythema and drainage. She has some blackened ischemic changes on exam where she is also exquisitely tender. She had ABIS this summer that showed 0.64, 0.63, c.w moderate PVD TBI <.69 were s and angiogram in June 2014. While her arteriogram showed that  she was a candidate for a femoral to below knee popliteal artery bypass grafting, Dr Scot Dock was concerned for increased surgical risk given age, obesity and other co morbidities. Patient did not herself wish to proceed with surgery or even arteriography.  She apparently has done a superb job managing her DM with A1c now at 6.1 and she is followed by Delrae Rend MD in addition to her PCP.  We were consulted to assist in management and workup of her diabetic and vascular foot infection. She has had improvement dramatically since admission per pt and team.   Past Medical History  Diagnosis Date  . Anemia   . Diabetes mellitus   . CAD (coronary artery disease)   . Peripheral vascular disease   . Hypertension   . Cancer     colon; s/p colectomy 2007  .  Anemia, iron deficiency 01/17/2012  . Hemorrhoids   . Nosebleed   . Stroke   . Cellulitis 03/08/12    Past Surgical History  Procedure Laterality Date  . Appendectomy    . Cholecystectomy    . Tonsilectomy, adenoidectomy, bilateral myringotomy and tubes    . Colon surgery      for colon CA  . Lower extremity angiogram  September 01, 2012  . Aortogram  September 01, 2012  ergies:   Allergies  Allergen Reactions  . Blackberry [Rubus Fruticosus]     Can't take because of Cancer  . Meloxicam Shortness Of Breath  . Procaine Hcl Swelling  . Meloxicam      Medications: I have reviewed patients current medications as documented in Epic Anti-infectives    Start     Dose/Rate Route Frequency Ordered Stop   01/12/14 1800  vancomycin (VANCOCIN) 500 mg in sodium chloride 0.9 % 100 mL IVPB     500 mg100 mL/hr over 60 Minutes Intravenous Every 24 hours 01/12/14 0032     01/08/14 2115  vancomycin (VANCOCIN) IVPB 1000 mg/200 mL premix  Status:  Discontinued     1,000 mg200 mL/hr over 60 Minutes Intravenous Every 24 hours 01/07/14 2107 01/12/14 0032   01/08/14 1800  vancomycin (VANCOCIN) IVPB 750 mg/150 ml premix  Status:  Discontinued     750 mg150 mL/hr over 60 Minutes Intravenous Every 24 hours 01/07/14 1735 01/07/14 2106   01/08/14 1600  metroNIDAZOLE (FLAGYL) tablet 500 mg     500 mg Oral 3 times per day 01/08/14 1526     01/08/14 1600  cefTRIAXone (ROCEPHIN) 1 g in dextrose 5 % 50 mL IVPB     1 g100 mL/hr over 30 Minutes Intravenous Every 24 hours 01/08/14 1526     01/08/14 0300  piperacillin-tazobactam (ZOSYN) IVPB 3.375 g  Status:  Discontinued     3.375 g12.5 mL/hr over 240 Minutes Intravenous Every 8 hours 01/07/14 1802 01/07/14 2108   01/07/14 2200  piperacillin-tazobactam (ZOSYN) IVPB 3.375 g  Status:  Discontinued     3.375 g12.5 mL/hr over 240 Minutes Intravenous Every 8 hours 01/07/14 2108 01/08/14 1526   01/07/14 2115  vancomycin (VANCOCIN) 1,250 mg in sodium chloride 0.9 % 250 mL  IVPB     1,250 mg166.7 mL/hr over 90 Minutes Intravenous  Once 01/07/14 2107 01/07/14 2350   01/07/14 1815  piperacillin-tazobactam (ZOSYN) IVPB 3.375 g  Status:  Discontinued     3.375 g100 mL/hr over 30 Minutes Intravenous  Once 01/07/14 1802 01/07/14 2107   01/07/14 1745  vancomycin (VANCOCIN) IVPB 1000 mg/200 mL premix  Status:  Discontinued     1,000 mg200 mL/hr over 60 Minutes Intravenous  Once 01/07/14 1734 01/07/14 2106      Social History:  reports that she quit smoking about 46 years ago. Her smoking use included Cigarettes. She started smoking about 59 years ago. She has a 13 pack-year smoking history. She has never used smokeless tobacco. She reports that she does not drink alcohol or use illicit drugs.  Family History  Problem Relation Age of Onset  . Cancer Mother   . Diabetes Mother   . Other Father     bleeding problems  . Diabetes Sister   . Heart disease Daughter     before age 21  . Cancer Son     As in HPI and primary teams notes otherwise 12 point review of systems is negative  Blood pressure 146/56, pulse 60, temperature 97.8 F (36.6 C), temperature source Oral, resp. rate 17, height 5' 1"  (1.549 m), weight 194 lb 0.1 oz (88 kg), SpO2 97 %. General: Alert and awake, oriented x3, not in any acute distress. HEENT: anicteric sclera,  EOMI, oropharynx clear and without exudate CVS regular rate, normal r,  no murmur rubs or gallops Chest: clear to auscultation bilaterally, no wheezing, rales or rhonchi Abdomen: soft nontender, nondistended, normal bowel sounds, Extremities: Faint DP pulse on left, difficult to palpate on the right and she is tender here  She has what appears to be chronic venous changess with superimposed erythema acutely, scaling she has darkened blackened area on dorsum of foot c/w near 4th toe ischemic changes and she is exquisitely tender to palpation in this area. See pictures         Neuro: nonfocal   Results for orders placed or  performed during the hospital encounter of 01/07/14 (from the past 48 hour(s))  Vancomycin, trough     Status: Abnormal   Collection Time: 01/11/14  8:27 PM  Result Value Ref Range   Vancomycin Tr 24.1 (H) 10.0 - 20.0 ug/mL  Basic metabolic panel     Status: Abnormal   Collection Time: 01/11/14  8:27 PM  Result Value Ref Range   Sodium 134 (L) 137 - 147 mEq/L   Potassium 4.3 3.7 - 5.3 mEq/L   Chloride 97 96 - 112 mEq/L   CO2 24 19 - 32 mEq/L  Glucose, Bld 107 (H) 70 - 99 mg/dL   BUN 38 (H) 6 - 23 mg/dL   Creatinine, Ser 1.44 (H) 0.50 - 1.10 mg/dL   Calcium 8.9 8.4 - 10.5 mg/dL   GFR calc non Af Amer 33 (L) >90 mL/min   GFR calc Af Amer 38 (L) >90 mL/min    Comment: (NOTE) The eGFR has been calculated using the CKD EPI equation. This calculation has not been validated in all clinical situations. eGFR's persistently <90 mL/min signify possible Chronic Kidney Disease.    Anion gap 13 5 - 15  Glucose, capillary     Status: Abnormal   Collection Time: 01/11/14 10:09 PM  Result Value Ref Range   Glucose-Capillary 142 (H) 70 - 99 mg/dL  CBC WITH DIFFERENTIAL     Status: Abnormal   Collection Time: 01/12/14  6:25 AM  Result Value Ref Range   WBC 6.1 4.0 - 10.5 K/uL   RBC 3.70 (L) 3.87 - 5.11 MIL/uL   Hemoglobin 10.4 (L) 12.0 - 15.0 g/dL   HCT 32.4 (L) 36.0 - 46.0 %   MCV 87.6 78.0 - 100.0 fL   MCH 28.1 26.0 - 34.0 pg   MCHC 32.1 30.0 - 36.0 g/dL   RDW 13.6 11.5 - 15.5 %   Platelets 234 150 - 400 K/uL   Neutrophils Relative % 65 43 - 77 %   Neutro Abs 4.0 1.7 - 7.7 K/uL   Lymphocytes Relative 17 12 - 46 %   Lymphs Abs 1.0 0.7 - 4.0 K/uL   Monocytes Relative 10 3 - 12 %   Monocytes Absolute 0.6 0.1 - 1.0 K/uL   Eosinophils Relative 7 (H) 0 - 5 %   Eosinophils Absolute 0.4 0.0 - 0.7 K/uL   Basophils Relative 1 0 - 1 %   Basophils Absolute 0.1 0.0 - 0.1 K/uL  Glucose, capillary     Status: None   Collection Time: 01/12/14  8:03 AM  Result Value Ref Range   Glucose-Capillary  81 70 - 99 mg/dL  Glucose, capillary     Status: Abnormal   Collection Time: 01/12/14 11:56 AM  Result Value Ref Range   Glucose-Capillary 100 (H) 70 - 99 mg/dL  Glucose, capillary     Status: Abnormal   Collection Time: 01/12/14  4:41 PM  Result Value Ref Range   Glucose-Capillary 122 (H) 70 - 99 mg/dL  Clostridium Difficile by PCR     Status: None   Collection Time: 01/12/14  7:38 PM  Result Value Ref Range   C difficile by pcr NEGATIVE NEGATIVE  Glucose, capillary     Status: Abnormal   Collection Time: 01/12/14 10:25 PM  Result Value Ref Range   Glucose-Capillary 129 (H) 70 - 99 mg/dL   Comment 1 Documented in Chart    Comment 2 Notify RN   CBC WITH DIFFERENTIAL     Status: Abnormal   Collection Time: 01/13/14  6:45 AM  Result Value Ref Range   WBC 6.7 4.0 - 10.5 K/uL   RBC 3.54 (L) 3.87 - 5.11 MIL/uL   Hemoglobin 10.1 (L) 12.0 - 15.0 g/dL   HCT 31.1 (L) 36.0 - 46.0 %   MCV 87.9 78.0 - 100.0 fL   MCH 28.5 26.0 - 34.0 pg   MCHC 32.5 30.0 - 36.0 g/dL   RDW 13.8 11.5 - 15.5 %   Platelets 229 150 - 400 K/uL   Neutrophils Relative % 66 43 - 77 %   Neutro Abs  4.4 1.7 - 7.7 K/uL   Lymphocytes Relative 16 12 - 46 %   Lymphs Abs 1.1 0.7 - 4.0 K/uL   Monocytes Relative 10 3 - 12 %   Monocytes Absolute 0.7 0.1 - 1.0 K/uL   Eosinophils Relative 7 (H) 0 - 5 %   Eosinophils Absolute 0.5 0.0 - 0.7 K/uL   Basophils Relative 1 0 - 1 %   Basophils Absolute 0.1 0.0 - 0.1 K/uL  Glucose, capillary     Status: None   Collection Time: 01/13/14  7:50 AM  Result Value Ref Range   Glucose-Capillary 75 70 - 99 mg/dL  Glucose, capillary     Status: Abnormal   Collection Time: 01/13/14 12:16 PM  Result Value Ref Range   Glucose-Capillary 113 (H) 70 - 99 mg/dL  Glucose, capillary     Status: Abnormal   Collection Time: 01/13/14  5:44 PM  Result Value Ref Range   Glucose-Capillary 152 (H) 70 - 99 mg/dL   @BRIEFLABTABLE (sdes,specrequest,cult,reptstatus)   ) Recent Results (from the past  720 hour(s))  Culture, blood (routine x 2)     Status: None   Collection Time: 01/07/14  4:30 PM  Result Value Ref Range Status   Specimen Description BLOOD RIGHT HAND  Final   Special Requests BOTTLES DRAWN AEROBIC ONLY 3CC  Final   Culture  Setup Time   Final    01/07/2014 22:58 Performed at [place]   Final    NO GROWTH 5 DAYS Performed at [facility]    Report Status 01/13/2014 FINAL  Final  Culture, blood (routine x 2)     Status: None   Collection Time: 01/07/14  4:45 PM  Result Value Ref Range Status   Specimen Description BLOOD RIGHT WRIST  Final   Special Requests BOTTLES DRAWN AEROBIC AND ANAEROBIC 5CC EACH  Final   Culture  Setup Time   Final    01/07/2014 22:58 Performed at [facility]    Culture   Final    NO GROWTH 5 DAYS Performed at [facility]    Report Status 01/13/2014 FINAL  Final  MRSA PCR Screening     Status: None   Collection Time: 01/07/14 10:08 PM  Result Value Ref Range Status   MRSA by PCR NEGATIVE NEGATIVE Final    Comment:        The GeneXpert MRSA Assay (FDA approved for NASAL specimens only), is one component of a comprehensive MRSA colonization surveillance program. It is not intended to diagnose MRSA infection nor to guide or monitor treatment for MRSA infections.  Wound culture     Status: None   Collection Time: 01/10/14 11:45 PM  Result Value Ref Range Status   Specimen Description WOUND RIGHT LEG  Final   Special Requests NONE  Final   Gram Stain   Final    RARE WBC PRESENT, PREDOMINANTLY PMN NO SQUAMOUS EPITHELIAL CELLS SEEN NO ORGANISMS SEEN Performed at [facility]    Culture   Final    FEW METHICILLIN RESISTANT STAPHYLOCOCCUS AUREUS Note: RIFAMPIN AND GENTAMICIN SHOULD NOT BE USED AS SINGLE DRUGS FOR TREATMENT OF STAPH INFECTIONS. This organism DOES NOT demonstrate inducible Clindamycin resistance in vitro. CRITICAL RESULT CALLED TO, READ BACK BY AND VERIFIED  WITH: LINDA B @ 830 AM  01/13/14 BY [facility] Performed at [facility]    Report Status 01/13/2014 FINAL  Final   Organism ID, Bacteria METHICILLIN RESISTANT STAPHYLOCOCCUS AUREUS  Final      Susceptibility   Methicillin resistant staphylococcus aureus - MIC*    CLINDAMYCIN <=0.25 SENSITIVE Sensitive     ERYTHROMYCIN >=8 RESISTANT Resistant     GENTAMICIN <=0.5 SENSITIVE Sensitive     LEVOFLOXACIN >=8 RESISTANT Resistant     OXACILLIN >=4 RESISTANT Resistant     PENICILLIN >=0.5 RESISTANT Resistant     RIFAMPIN <=0.5 SENSITIVE Sensitive     TRIMETH/SULFA >=320 RESISTANT Resistant     VANCOMYCIN 1 SENSITIVE Sensitive     TETRACYCLINE <=1 SENSITIVE Sensitive     * FEW METHICILLIN RESISTANT STAPHYLOCOCCUS AUREUS  Clostridium Difficile by PCR     Status: None   Collection Time: 01/12/14  7:38 PM  Result Value Ref Range Status   C difficile by pcr NEGATIVE NEGATIVE Final     Impression/Recommendation  Principal Problem:   Cellulitis of leg, right Active Problems:   DM type 2 causing renal disease   Anemia, iron deficiency   PVD (peripheral vascular disease)   Hyperkalemia   Morbid obesity   Essential hypertension   Hypothyroidism   Depression   History of colon cancer   History of colectomy   Sandra [name] is a 80 y.o. female with  Severe PVD, DM (well controlled) diabetic foot infection, ischemic changes and celllulitis  #1 Diabetic foot infection with ischemic changes on exam that per patient are new and appears to be recovering from cellulitis. Her Plain films do NOT show osteomyelitis or deep infection  --I have engaged DIABETIC FOOT FOCUSED ORDER SET  --I will get MRI without contrast to rule out deep infection and osteomyelitis  --fine to continue her current abx in interim  --if no deep infection I would convert her over to oral DOXYCYCLINE one tablet BID to finish additional 7 days given that she reportedly was failing outpatient keflex. While Surface  cultures are as a rule worthless, MRSA when isolation can be  A representative of deep infection (though more likely through statistical chance than power of superficial cx)  (NOTE I INITIALLY MENTIONED USING BACTRIM BUT SINCE HER ORGANIMS FROM SUPERFICIAL SWAB SHOWED TMP Ferman Hamming R THEN WOULD NOT WANT TO USE BACTRIM   --VIA DF FOCUSED ORDER SET, Nutrition consult  --Would apprise Dr. Scot Dock and [facility] of her situation --She will need outpatient follwoup with her PCP, [place], Dr. Scot Dock      01/13/2014, 7:45 PM   Thank you so much for this interesting consult  [place] [facility] [place] [phone] (pager) [phone] (office) 01/13/2014, 7:45 PM  Rhina [place] 01/13/2014, 7:45 PM

## 2014-01-13 NOTE — Progress Notes (Signed)
INITIAL NUTRITION ASSESSMENT  DOCUMENTATION CODES Per approved criteria  -Obesity Unspecified   INTERVENTION: 30 ml Prostat BID, which will provide 200 kcals and 30 grams protein daily  NUTRITION DIAGNOSIS: Increased nutrient needs related to wound healing as evidenced by estimated nutritional needs, sacral pressure ulcer.   Goal: Pt will meet >90% of estimated nutritional needs  Monitor:  PO/supplement intake, labs, weight changes, I/O's  Reason for Assessment: Consult for wound healing  80 y.o. female  Admitting Dx: Cellulitis of leg, right  Patient is an 80 year old female with past medical history of diabetes mellitus, chronic ulceration of right lower extremity secondary to peripheral vascular disease who was admitted to the hospitalist service on 10/29 for worsening lower extremity cellulitis for the past few weeks. Patient was started on broad-spectrum antibiotics initially.  ASSESSMENT: Pt reports good appetite during hospitalization and PTA. Documented meal intake PO: 100% She reveals that she has been trying to lose weight through lifestyle changes. She is following a healthy diet which mainly includes lean proteins, vegetables, salads, and fruit. She reports she has been working hard to optimizing glycemic control and is very proud of her accomplishments.  She confirms presence of both cellulitis and sacral pressure ulcer. Dicussed importance of proper nutrition to facilitate wound healing.Reviewed examples of protein containing foods and encouraged consumption to preserve lean muscle mass and to promote wound healing. Commended her on excellent glycemic control and lifestyle changes and educated on role in promoting wound healing. Na: 143, BUN/Creat: 38/1.44 Glucose: 187. CBGS: 75-129.  Height: Ht Readings from Last 1 Encounters:  01/07/14 5\' 1"  (1.549 m)    Weight: Wt Readings from Last 1 Encounters:  01/13/14 194 lb 0.1 oz (88 kg)    Ideal Body Weight: 110#  %  Ideal Body Weight: 176%  Wt Readings from Last 10 Encounters:  01/13/14 194 lb 0.1 oz (88 kg)  09/23/13 201 lb (91.173 kg)  07/30/13 214 lb 1.9 oz (97.124 kg)  07/15/13 221 lb (100.245 kg)  07/09/13 230 lb (104.327 kg)  02/25/13 219 lb (99.338 kg)  01/08/13 219 lb (99.338 kg)  11/26/12 224 lb 6.4 oz (101.787 kg)  09/24/12 215 lb (97.523 kg)  09/01/12 213 lb (96.616 kg)    Usual Body Weight: 219#  % Usual Body Weight: 89%  BMI:  Body mass index is 36.68 kg/(m^2). Obesity, class II  Estimated Nutritional Needs: Kcal: 2100-2300 Protein: 110-120 grams Fluid: 2.1-2.3 L  Skin: rt leg cellulitis, stage II pressure ulcer on buttocks  Diet Order: Diet heart healthy/carb modified  EDUCATION NEEDS: -Education needs addressed   Intake/Output Summary (Last 24 hours) at 01/13/14 1559 Last data filed at 01/12/14 2300  Gross per 24 hour  Intake    555 ml  Output    400 ml  Net    155 ml    Last BM: 01/12/14  Labs:   Recent Labs Lab 01/08/14 0425 01/09/14 0619 01/11/14 2027  NA 137 138 134*  K 4.7 4.6 4.3  CL 102 104 97  CO2 23 21 24   BUN 44* 37* 38*  CREATININE 1.35* 1.41* 1.44*  CALCIUM 8.8 8.7 8.9  GLUCOSE 173* 101* 107*    CBG (last 3)   Recent Labs  01/12/14 2225 01/13/14 0750 01/13/14 1216  GLUCAP 129* 75 113*   Lab Results  Component Value Date   HGBA1C 6.1* 01/07/2014   Scheduled Meds: . aspirin EC  81 mg Oral QPM  . cefTRIAXone (ROCEPHIN)  IV  1 g Intravenous Q24H  .  FLUoxetine  20 mg Oral Daily  . furosemide  20 mg Oral BID  . heparin  5,000 Units Subcutaneous 3 times per day  . insulin aspart  0-5 Units Subcutaneous QHS  . insulin aspart  0-9 Units Subcutaneous TID WC  . insulin glargine  40 Units Subcutaneous QHS  . levothyroxine  125 mcg Oral QAC breakfast  . metroNIDAZOLE  500 mg Oral 3 times per day  . sodium chloride  3 mL Intravenous Q12H  . vancomycin  500 mg Intravenous Q24H    Continuous Infusions: . sodium chloride 10 mL/hr  at 01/12/14 0800    Past Medical History  Diagnosis Date  . Anemia   . Diabetes mellitus   . CAD (coronary artery disease)   . Peripheral vascular disease   . Hypertension   . Cancer     colon; s/p colectomy 2007  . Anemia, iron deficiency 01/17/2012  . Hemorrhoids   . Nosebleed   . Stroke   . Cellulitis 03/08/12    Past Surgical History  Procedure Laterality Date  . Appendectomy    . Cholecystectomy    . Tonsilectomy, adenoidectomy, bilateral myringotomy and tubes    . Colon surgery      for colon CA  . Lower extremity angiogram  September 01, 2012  . Aortogram  September 01, 2012    Wyat Infinger A. Jimmye Norman, RD, LDN Pager: [phone] After hours Pager: [phone]

## 2014-01-13 NOTE — Progress Notes (Signed)
[facility]                                                                              Patient Demographics  Sandra [name], is a 80 y.o. female, DOB - 1930/06/16, YCX:[id number]  Admit date - 01/07/2014   Admitting Physician Thurnell Lose, MD  Outpatient Primary MD for the patient is  Melinda Crutch, MD  LOS - 6   Chief Complaint  Patient presents with  . Wound Check        Assessment & Plan   Right lower extremity cellulitis -Patient has completed 5 days of antibiotics including Flagyl, Rocephin, vancomycin -wound care consulted and appreciated -PT recommended home health physical therapy -right foot x-ray showed no definite evidence of osteomyelitis -infectious disease consultation appreciated and recommended MRI  Diabetes mellitus, type II -CBGs have remained stable, continue home regimen  Iron deficiency anemia -hemoglobin has remained stable  Peripheral vascular disease -Patient follows up with Dr. Scot Dock as an outpatient -Patient decided she did not want to have an angiogram conducted  Morbid obesity -BMI greater than 40 -Patient should speak with her primary care physician regarding diet and lifestyle modifications upon discharge  Essential hypertension -stable,Continue home regimen  Hypothyroidism -continue Synthroid  Depression -Continue Celexa  History of colon cancer and colectomy -Continue to follow up with oncology as an outpatient  Left shoulder pain -Physical therapy recommended home health -MRI of the left shoulder: clear osteoarthritis of the Glenohumeral joint with flattening and avascular necrosis of the humeral head, severe degeneration of the supraspinatus tendon with multiple tears without retraction  Code Status: Full  Family Communication: None at bedside  Disposition Plan: Admitted  Time Spent in minutes   30 minutes  Procedures  None  Consults   Infectious disease Wound care  DVT Prophylaxis  Heparin  Lab  Results  Component Value Date   PLT 229 01/13/2014    Medications  Scheduled Meds: . aspirin EC  81 mg Oral QPM  . cefTRIAXone (ROCEPHIN)  IV  1 g Intravenous Q24H  . FLUoxetine  20 mg Oral Daily  . furosemide  20 mg Oral BID  . heparin  5,000 Units Subcutaneous 3 times per day  . insulin aspart  0-5 Units Subcutaneous QHS  . insulin aspart  0-9 Units Subcutaneous TID WC  . insulin glargine  40 Units Subcutaneous QHS  . levothyroxine  125 mcg Oral QAC breakfast  . metroNIDAZOLE  500 mg Oral 3 times per day  . sodium chloride  3 mL Intravenous Q12H  . vancomycin  500 mg Intravenous Q24H   Continuous Infusions: . sodium chloride 10 mL/hr at 01/12/14 0800   PRN Meds:.HYDROmorphone (DILAUDID) injection  Antibiotics    Anti-infectives    Start     Dose/Rate Route Frequency Ordered Stop   01/12/14 1800  vancomycin (VANCOCIN) 500 mg in sodium chloride 0.9 % 100 mL IVPB     500 mg100 mL/hr over 60 Minutes Intravenous Every 24 hours 01/12/14 0032     01/08/14 2115  vancomycin (VANCOCIN) IVPB 1000 mg/200 mL premix  Status:  Discontinued     1,000 mg200 mL/hr over 60 Minutes Intravenous Every 24 hours 01/07/14 2107  01/12/14 0032   01/08/14 1800  vancomycin (VANCOCIN) IVPB 750 mg/150 ml premix  Status:  Discontinued     750 mg150 mL/hr over 60 Minutes Intravenous Every 24 hours 01/07/14 1735 01/07/14 2106   01/08/14 1600  metroNIDAZOLE (FLAGYL) tablet 500 mg     500 mg Oral 3 times per day 01/08/14 1526     01/08/14 1600  cefTRIAXone (ROCEPHIN) 1 g in dextrose 5 % 50 mL IVPB     1 g100 mL/hr over 30 Minutes Intravenous Every 24 hours 01/08/14 1526     01/08/14 0300  piperacillin-tazobactam (ZOSYN) IVPB 3.375 g  Status:  Discontinued     3.375 g12.5 mL/hr over 240 Minutes Intravenous Every 8 hours 01/07/14 1802 01/07/14 2108   01/07/14 2200  piperacillin-tazobactam (ZOSYN) IVPB 3.375 g  Status:  Discontinued     3.375 g12.5 mL/hr over 240 Minutes Intravenous Every 8 hours 01/07/14 2108  01/08/14 1526   01/07/14 2115  vancomycin (VANCOCIN) 1,250 mg in sodium chloride 0.9 % 250 mL IVPB     1,250 mg166.7 mL/hr over 90 Minutes Intravenous  Once 01/07/14 2107 01/07/14 2350   01/07/14 1815  piperacillin-tazobactam (ZOSYN) IVPB 3.375 g  Status:  Discontinued     3.375 g100 mL/hr over 30 Minutes Intravenous  Once 01/07/14 1802 01/07/14 2107   01/07/14 1745  vancomycin (VANCOCIN) IVPB 1000 mg/200 mL premix  Status:  Discontinued     1,000 mg200 mL/hr over 60 Minutes Intravenous  Once 01/07/14 1734 01/07/14 2106        Subjective:   Abigaelle Schupp seen and examined today.  Patient continues to complain of some pain in her right leg and left shoulder.  Denies chest pain, SOB, abdominal pain.   Objective:   Filed Vitals:   01/13/14 1002 01/13/14 1004 01/13/14 1500 01/13/14 1539  BP: 153/35 152/40 146/56 146/56  Pulse:   44 44  Temp:   97.8 F (36.6 C)   TempSrc:   Oral   Resp:   17   Height:      Weight:      SpO2:   97% 97%    Wt Readings from Last 3 Encounters:  01/13/14 88 kg (194 lb 0.1 oz)  09/23/13 91.173 kg (201 lb)  07/30/13 97.124 kg (214 lb 1.9 oz)     Intake/Output Summary (Last 24 hours) at 01/13/14 1552 Last data filed at 01/12/14 2300  Gross per 24 hour  Intake    555 ml  Output    400 ml  Net    155 ml    Exam  General: Well developed, well nourished, NAD, appears stated age  HEENT: NCAT, mucous membranes moist.  Cardiovascular: S1 S2 auscultated, 2/6 SEM. Regular rate and rhythm.  Respiratory: Clear to auscultation bilaterally with equal chest rise  Abdomen: Soft, nontender, nondistended, + bowel sounds  Extremities: warm dry without cyanosis clubbing. Edema RLE ext currently wrapped, some tenderness. Left shoulder tenderness with movement  Neuro: AAOx3  Skin: Stage 2 sacral decub (right buttock)  Psych: Appropriate mood and affect  Data Review   Micro Results Recent Results (from the past 240 hour(s))  Culture, blood (routine  x 2)     Status: None   Collection Time: 01/07/14  4:30 PM  Result Value Ref Range Status   Specimen Description BLOOD RIGHT HAND  Final   Special Requests BOTTLES DRAWN AEROBIC ONLY 3CC  Final   Culture  Setup Time   Final    01/07/2014 22:58 Performed  at [facility]    Culture   Final    NO GROWTH 5 DAYS Performed at [facility]    Report Status 01/13/2014 FINAL  Final  Culture, blood (routine x 2)     Status: None   Collection Time: 01/07/14  4:45 PM  Result Value Ref Range Status   Specimen Description BLOOD RIGHT WRIST  Final   Special Requests BOTTLES DRAWN AEROBIC AND ANAEROBIC 5CC EACH  Final   Culture  Setup Time   Final    01/07/2014 22:58 Performed at [facility]   Final    NO GROWTH 5 DAYS Performed at [facility]    Report Status 01/13/2014 FINAL  Final  MRSA PCR Screening     Status: None   Collection Time: 01/07/14 10:08 PM  Result Value Ref Range Status   MRSA by PCR NEGATIVE NEGATIVE Final    Comment:        The GeneXpert MRSA Assay (FDA approved for NASAL specimens only), is one component of a comprehensive MRSA colonization surveillance program. It is not intended to diagnose MRSA infection nor to guide or monitor treatment for MRSA infections.  Wound culture     Status: None   Collection Time: 01/10/14 11:45 PM  Result Value Ref Range Status   Specimen Description WOUND RIGHT LEG  Final   Special Requests NONE  Final   Gram Stain   Final    RARE WBC PRESENT, PREDOMINANTLY PMN NO SQUAMOUS EPITHELIAL CELLS SEEN NO ORGANISMS SEEN Performed at [facility]    Culture   Final    FEW METHICILLIN RESISTANT STAPHYLOCOCCUS AUREUS Note: RIFAMPIN AND GENTAMICIN SHOULD NOT BE USED AS SINGLE DRUGS FOR TREATMENT OF STAPH INFECTIONS. This organism DOES NOT demonstrate inducible Clindamycin resistance in vitro. CRITICAL RESULT CALLED TO, READ BACK BY AND VERIFIED WITH: LINDA B @ 830 AM  01/13/14 BY  MORAC Performed at [facility]    Report Status 01/13/2014 FINAL  Final   Organism ID, Bacteria METHICILLIN RESISTANT STAPHYLOCOCCUS AUREUS  Final      Susceptibility   Methicillin resistant staphylococcus aureus - MIC*    CLINDAMYCIN <=0.25 SENSITIVE Sensitive     ERYTHROMYCIN >=8 RESISTANT Resistant     GENTAMICIN <=0.5 SENSITIVE Sensitive     LEVOFLOXACIN >=8 RESISTANT Resistant     OXACILLIN >=4 RESISTANT Resistant     PENICILLIN >=0.5 RESISTANT Resistant     RIFAMPIN <=0.5 SENSITIVE Sensitive     TRIMETH/SULFA >=320 RESISTANT Resistant     VANCOMYCIN 1 SENSITIVE Sensitive     TETRACYCLINE <=1 SENSITIVE Sensitive     * FEW METHICILLIN RESISTANT STAPHYLOCOCCUS AUREUS  Clostridium Difficile by PCR     Status: None   Collection Time: 01/12/14  7:38 PM  Result Value Ref Range Status   C difficile by pcr NEGATIVE NEGATIVE Final    Radiology Reports Mr Shoulder Left Wo Contrast  01/13/2014   CLINICAL DATA:  Left shoulder pain with inability to move the shoulder and arm.  EXAM: MRI OF THE LEFT SHOULDER WITHOUT CONTRAST  TECHNIQUE: Multiplanar, multisequence MR imaging of the shoulder was performed. No intravenous contrast was administered.  COMPARISON:  None.  FINDINGS: Rotator cuff: The supraspinatus tendon is severely degenerated with an intrasubstance tear at the anterior aspect of the distal tendon and a 60% thickness bursal surface tear at the musculotendinous junction.  The other tendons of the rotator cuff are intact.  Muscles: There is moderately  severe atrophy of the infraspinatus and supraspinatus and teres minor muscles with moderate atrophy of the subscapularis muscle.  Biceps long head:  Properly located and intact.  Acromioclavicular Joint: Moderate arthropathy. Normal type 2 acromion.  Glenohumeral Joint: Severe osteoarthritis with diffuse full-thickness cartilage loss on the glenoid and humeral head. Flattening and subcortical edema and focal avascular necrosis of the  humeral head is present. Moderate joint effusion.  Labrum: Diffusely degenerated. Motion artifact limits assessment of the degenerated labrum but I see no discrete tear.  Bones:  The extra-articular bones are normal.  IMPRESSION: 1. Severe osteoarthritis of the glenohumeral joint with flattening and avascular necrosis of the humeral head. 2. Severe degeneration of the supraspinatus tendon with multiple tears without retraction.   Electronically Signed   By: Rozetta Nunnery M.D.   On: 01/13/2014 09:24   Dg Foot Complete Right  01/13/2014   CLINICAL DATA:  Diabetic foot ulcer with pain.  EXAM: RIGHT FOOT COMPLETE - 3+ VIEW  COMPARISON:  None.  FINDINGS: There is mild diffuse decreased bone mineralization. Moderate hallux valgus deformity is present. There are mild degenerative changes of the midfoot. There are moderate degenerative changes of the first and second tarsal metatarsal joints. Mild degenerative change of the first MTP joint. No fracture or dislocation. No definite bone destruction to suggest osteomyelitis. Small vessel atherosclerotic disease is present.  IMPRESSION: No definite evidence of osteomyelitis.  Degenerative changes as described with moderate hallux valgus deformity.   Electronically Signed   By: Marin Olp M.D.   On: 01/13/2014 10:54    CBC  Recent Labs Lab 01/09/14 0619 01/10/14 0540 01/11/14 0522 01/12/14 0625 01/13/14 0645  WBC 6.4 6.3 5.9 6.1 6.7  HGB 9.5* 9.8* 9.8* 10.4* 10.1*  HCT 29.5* 30.4* 30.5* 32.4* 31.1*  PLT 204 216 222 234 229  MCV 88.1 88.4 87.4 87.6 87.9  MCH 28.4 28.5 28.1 28.1 28.5  MCHC 32.2 32.2 32.1 32.1 32.5  RDW 13.5 13.6 13.6 13.6 13.8  LYMPHSABS 1.0 1.1 1.3 1.0 1.1  MONOABS 0.5 0.6 0.4 0.6 0.7  EOSABS 0.4 0.5 0.4 0.4 0.5  BASOSABS 0.0 0.1 0.0 0.1 0.1    Chemistries   Recent Labs Lab 01/07/14 1655 01/07/14 2140 01/08/14 0425 01/09/14 0619 01/11/14 2027  NA 137  --  137 138 134*  K 5.7*  --  4.7 4.6 4.3  CL 99  --  102 104 97  CO2  25  --  23 21 24   GLUCOSE 184*  --  173* 101* 107*  BUN 56*  --  44* 37* 38*  CREATININE 1.60* 1.40* 1.35* 1.41* 1.44*  CALCIUM 9.4  --  8.8 8.7 8.9  AST  --   --  17  --   --   ALT  --   --  12  --   --   ALKPHOS  --   --  102  --   --   BILITOT  --   --  0.4  --   --    ------------------------------------------------------------------------------------------------------------------ estimated creatinine clearance is 30.9 mL/min (by C-G formula based on Cr of 1.44). ------------------------------------------------------------------------------------------------------------------ No results for input(s): HGBA1C in the last 72 hours. ------------------------------------------------------------------------------------------------------------------ No results for input(s): CHOL, HDL, LDLCALC, TRIG, CHOLHDL, LDLDIRECT in the last 72 hours. ------------------------------------------------------------------------------------------------------------------ No results for input(s): TSH, T4TOTAL, T3FREE, THYROIDAB in the last 72 hours.  Invalid input(s): FREET3 ------------------------------------------------------------------------------------------------------------------ No results for input(s): VITAMINB12, FOLATE, FERRITIN, TIBC, IRON, RETICCTPCT in the last 72 hours.  Coagulation profile No  results for input(s): INR, PROTIME in the last 168 hours.  No results for input(s): DDIMER in the last 72 hours.  Cardiac Enzymes No results for input(s): CKMB, TROPONINI, MYOGLOBIN in the last 168 hours.  Invalid input(s): CK ------------------------------------------------------------------------------------------------------------------ Invalid input(s): POCBNP    Karem Farha D.O. on 01/13/2014 at 3:52 PM  Between 7am to 7pm - Pager - [phone]  After 7pm go to [email or web address] - password [email or web address]  And look for the night coverage person covering for me after hours  [facility]  Group Office  [phone]

## 2014-01-13 NOTE — Progress Notes (Signed)
Inpatient Diabetes Program Recommendations  AACE/ADA: New Consensus Statement on Inpatient Glycemic Control (2013)  Target Ranges:  Prepandial:   less than 140 mg/dL      Peak postprandial:   less than 180 mg/dL (1-2 hours)      Critically ill patients:  140 - 180 mg/dL     Results for Sandra [name], Sandra [name] (MRN [id number]) as of 01/13/2014 14:16  Ref. Range 01/12/2014 08:03 01/12/2014 11:56 01/12/2014 16:41 01/12/2014 22:25  Glucose-Capillary Latest Range: 70-99 mg/dL 81 100 (H) 122 (H) 129 (H)    Results for Sandra [name], Sandra [name] (MRN [id number]) as of 01/13/2014 14:16  Ref. Range 01/13/2014 07:50 01/13/2014 12:16  Glucose-Capillary Latest Range: 70-99 mg/dL 75 113 (H)    Results for Sandra [name], Sandra [name] (MRN [id number]) as of 01/13/2014 14:16  Ref. Range 01/07/2014 21:40  Hgb A1c MFr Bld Latest Range: <5.7 % 6.1 (H)     Admitted with Cellulitis and Hyperkalemia.  History of DM, PVD, CVA.     Home DM Meds: Lantus 40 units QHS   Current DM Meds: Lantus 40 units QHS          Novolog Sensitive SSI tid ac + HS    **CBGs well controlled thus far on current insulin regimen  **A1c shows excellent glucose control at home    Will follow Sandra Quaker RN, MSN, CDE Diabetes Coordinator Inpatient Diabetes Program Team Pager: [phone] (8a-10p)

## 2014-01-13 NOTE — Progress Notes (Signed)
Physical Therapy Treatment Patient Details Name: Sandra [name] MRN: [id number] DOB: 06-22-1930 Today's Date: 01/13/2014    History of Present Illness Pt admitted with cellulitis of Right leg. Pt currently with functional mobility limitations due to pain and decreased activity tolerance. Pt was agitated regarding getting out of bed and was reluctant to show mobility status to PT. Pt able to ambulate in room with rollator and supervision of PT. Pt will benefit from skilled PT to increase her independence and safety with mobility in order to return home to [facility].    PT Comments    Progressing steadily.  Follow Up Recommendations  Home health PT     Equipment Recommendations  None recommended by PT    Recommendations for Other Services       Precautions / Restrictions Precautions Precautions: Fall    Mobility  Bed Mobility Overal bed mobility: Independent             General bed mobility comments: Pt able to roll in bed and perform supine to sit without PT assitance using handrails.  Transfers Overall transfer level: Modified independent Equipment used: 4-wheeled walker             General transfer comment: pt will not allow assist, but is generally steady with transfer, even though not the safest methods  Ambulation/Gait Ambulation/Gait assistance: Supervision Ambulation Distance (Feet): 130 Feet Assistive device: 4-wheeled walker Gait Pattern/deviations: Step-through pattern Gait velocity: slow   General Gait Details: Generally steady and safe with rollator in home-like environment   Stairs            Wheelchair Mobility    Modified Rankin (Stroke Patients Only)       Balance Overall balance assessment: Needs assistance Sitting-balance support: No upper extremity supported Sitting balance-Leahy Scale: Fair     Standing balance support: No upper extremity supported;Single extremity supported Standing  balance-Leahy Scale: Fair                      Cognition Arousal/Alertness: Awake/alert Behavior During Therapy: Agitated Overall Cognitive Status: Within Functional Limits for tasks assessed                      Exercises      General Comments        Pertinent Vitals/Pain Pain Assessment: No/denies pain    Home Living                      Prior Function            PT Goals (current goals can now be found in the care plan section) Acute Rehab PT Goals Patient Stated Goal: Return to previous living situation PT Goal Formulation: With patient Time For Goal Achievement: 01/25/14 Potential to Achieve Goals: Good Progress towards PT goals: Progressing toward goals    Frequency  Min 3X/week    PT Plan Current plan remains appropriate    Co-evaluation             End of Session   Activity Tolerance: Patient tolerated treatment well Patient left: in bed;with call bell/phone within reach;with bed alarm set     Time: 4098-1191 PT Time Calculation (min): 29 min  Charges:  $Gait Training: 8-22 mins $Therapeutic Activity: 8-22 mins                    G Codes:      Lillie Portner, Chrissie Noa  V 01/13/2014, 5:49 PM 01/13/2014  Donnella Sham, PT [phone]  (pager)

## 2014-01-14 ENCOUNTER — Inpatient Hospital Stay (HOSPITAL_COMMUNITY): Payer: Medicare Other

## 2014-01-14 DIAGNOSIS — M25519 Pain in unspecified shoulder: Secondary | ICD-10-CM | POA: Insufficient documentation

## 2014-01-14 DIAGNOSIS — F329 Major depressive disorder, single episode, unspecified: Secondary | ICD-10-CM

## 2014-01-14 DIAGNOSIS — E1169 Type 2 diabetes mellitus with other specified complication: Secondary | ICD-10-CM

## 2014-01-14 DIAGNOSIS — D509 Iron deficiency anemia, unspecified: Secondary | ICD-10-CM

## 2014-01-14 DIAGNOSIS — Z9889 Other specified postprocedural states: Secondary | ICD-10-CM

## 2014-01-14 DIAGNOSIS — E039 Hypothyroidism, unspecified: Secondary | ICD-10-CM

## 2014-01-14 DIAGNOSIS — Z85038 Personal history of other malignant neoplasm of large intestine: Secondary | ICD-10-CM

## 2014-01-14 LAB — CBC WITH DIFFERENTIAL/PLATELET
BASOS ABS: 0.1 10*3/uL (ref 0.0–0.1)
Basophils Relative: 1 % (ref 0–1)
Eosinophils Absolute: 0.5 10*3/uL (ref 0.0–0.7)
Eosinophils Relative: 8 % — ABNORMAL HIGH (ref 0–5)
HCT: 32 % — ABNORMAL LOW (ref 36.0–46.0)
Hemoglobin: 10.3 g/dL — ABNORMAL LOW (ref 12.0–15.0)
LYMPHS PCT: 15 % (ref 12–46)
Lymphs Abs: 1 10*3/uL (ref 0.7–4.0)
MCH: 28.5 pg (ref 26.0–34.0)
MCHC: 32.2 g/dL (ref 30.0–36.0)
MCV: 88.4 fL (ref 78.0–100.0)
MONO ABS: 0.6 10*3/uL (ref 0.1–1.0)
Monocytes Relative: 9 % (ref 3–12)
NEUTROS ABS: 4.4 10*3/uL (ref 1.7–7.7)
Neutrophils Relative %: 67 % (ref 43–77)
PLATELETS: 213 10*3/uL (ref 150–400)
RBC: 3.62 MIL/uL — ABNORMAL LOW (ref 3.87–5.11)
RDW: 14.1 % (ref 11.5–15.5)
WBC: 6.6 10*3/uL (ref 4.0–10.5)

## 2014-01-14 LAB — GLUCOSE, CAPILLARY
GLUCOSE-CAPILLARY: 56 mg/dL — AB (ref 70–99)
GLUCOSE-CAPILLARY: 80 mg/dL (ref 70–99)
GLUCOSE-CAPILLARY: 114 mg/dL — AB (ref 70–99)
Glucose-Capillary: 140 mg/dL — ABNORMAL HIGH (ref 70–99)

## 2014-01-14 LAB — BASIC METABOLIC PANEL
ANION GAP: 14 (ref 5–15)
BUN: 42 mg/dL — ABNORMAL HIGH (ref 6–23)
CALCIUM: 9.4 mg/dL (ref 8.4–10.5)
CHLORIDE: 101 meq/L (ref 96–112)
CO2: 23 meq/L (ref 19–32)
CREATININE: 1.45 mg/dL — AB (ref 0.50–1.10)
GFR calc non Af Amer: 33 mL/min — ABNORMAL LOW (ref >90)
GFR, EST AFRICAN AMERICAN: 38 mL/min — AB (ref >90)
Glucose, Bld: 51 mg/dL — ABNORMAL LOW (ref 70–99)
Potassium: 4.4 mEq/L (ref 3.7–5.3)
Sodium: 138 mEq/L (ref 137–147)

## 2014-01-14 LAB — C-REACTIVE PROTEIN

## 2014-01-14 LAB — HIV ANTIBODY (ROUTINE TESTING W REFLEX): HIV 1&2 Ab, 4th Generation: NONREACTIVE

## 2014-01-14 LAB — SEDIMENTATION RATE: Sed Rate: 50 mm/hr — ABNORMAL HIGH (ref 0–22)

## 2014-01-14 LAB — ALBUMIN: Albumin: 2.7 g/dL — ABNORMAL LOW (ref 3.5–5.2)

## 2014-01-14 MED ORDER — DOXYCYCLINE HYCLATE 100 MG PO TABS
100.0000 mg | ORAL_TABLET | Freq: Two times a day (BID) | ORAL | Status: DC
  Filled 2014-01-14: qty 1

## 2014-01-14 MED ORDER — DOXYCYCLINE HYCLATE 100 MG PO TABS: 100.0000 mg | ORAL_TABLET | Freq: Two times a day (BID) | ORAL | Status: DC

## 2014-01-14 MED ORDER — INSULIN GLARGINE 100 UNIT/ML ~~LOC~~ SOLN
30.0000 [IU] | Freq: Every day | SUBCUTANEOUS | Status: DC
  Filled 2014-01-14: qty 0.3

## 2014-01-14 MED ORDER — HYDROCODONE-ACETAMINOPHEN 5-325 MG PO TABS
1.0000 | ORAL_TABLET | ORAL | Status: DC | PRN
  Administered 2014-01-14: 1 via ORAL
  Filled 2014-01-14: qty 1

## 2014-01-14 MED ORDER — PRO-STAT SUGAR FREE PO LIQD: 30.0000 mL | Freq: Three times a day (TID) | ORAL | Status: DC

## 2014-01-14 NOTE — Plan of Care (Signed)
Problem: Phase II Progression Outcomes Goal: Progress activity as tolerated unless otherwise ordered Outcome: Completed/Met Date Met:  01/14/14 Goal: Discharge plan established Outcome: Completed/Met Date Met:  01/14/14 Goal: Wound without signs/symptoms of infection, decreasing edema Outcome: Adequate for Discharge  Problem: Phase III Progression Outcomes Goal: Activity at appropriate level-compared to baseline (UP IN CHAIR FOR HEMODIALYSIS)  Outcome: Adequate for Discharge Going home with home health Goal: Discharge plan remains appropriate-arrangements made Outcome: Completed/Met Date Met:  01/14/14 Goal: IV Meds changed to PO Outcome: Completed/Met Date Met:  01/14/14 Goal: Wound care performed by pt/family Outcome: Adequate for Discharge Going home with home health  Goal: Other Phase III Outcomes/Goals Outcome: Not Applicable Date Met:  30/81/68

## 2014-01-14 NOTE — Progress Notes (Signed)
[facility]    Subjective: No new complaints   Antibiotics:  Anti-infectives    Start     Dose/Rate Route Frequency Ordered Stop   01/14/14 1545  doxycycline (VIBRA-TABS) tablet 100 mg     100 mg Oral Every 12 hours 01/14/14 1540 01/21/14 0959   01/14/14 0000  doxycycline (VIBRA-TABS) 100 MG tablet     100 mg Oral 2 times daily 01/14/14 1535     01/12/14 1800  vancomycin (VANCOCIN) 500 mg in sodium chloride 0.9 % 100 mL IVPB  Status:  Discontinued     500 mg100 mL/hr over 60 Minutes Intravenous Every 24 hours 01/12/14 0032 01/14/14 1540   01/08/14 2115  vancomycin (VANCOCIN) IVPB 1000 mg/200 mL premix  Status:  Discontinued     1,000 mg200 mL/hr over 60 Minutes Intravenous Every 24 hours 01/07/14 2107 01/12/14 0032   01/08/14 1800  vancomycin (VANCOCIN) IVPB 750 mg/150 ml premix  Status:  Discontinued     750 mg150 mL/hr over 60 Minutes Intravenous Every 24 hours 01/07/14 1735 01/07/14 2106   01/08/14 1600  metroNIDAZOLE (FLAGYL) tablet 500 mg  Status:  Discontinued     500 mg Oral 3 times per day 01/08/14 1526 01/14/14 1540   01/08/14 1600  cefTRIAXone (ROCEPHIN) 1 g in dextrose 5 % 50 mL IVPB  Status:  Discontinued     1 g100 mL/hr over 30 Minutes Intravenous Every 24 hours 01/08/14 1526 01/14/14 1540   01/08/14 0300  piperacillin-tazobactam (ZOSYN) IVPB 3.375 g  Status:  Discontinued     3.375 g12.5 mL/hr over 240 Minutes Intravenous Every 8 hours 01/07/14 1802 01/07/14 2108   01/07/14 2200  piperacillin-tazobactam (ZOSYN) IVPB 3.375 g  Status:  Discontinued     3.375 g12.5 mL/hr over 240 Minutes Intravenous Every 8 hours 01/07/14 2108 01/08/14 1526   01/07/14 2115  vancomycin (VANCOCIN) 1,250 mg in sodium chloride 0.9 % 250 mL IVPB     1,250 mg166.7 mL/hr over 90 Minutes Intravenous  Once 01/07/14 2107 01/07/14 2350   01/07/14 1815  piperacillin-tazobactam (ZOSYN) IVPB 3.375 g  Status:  Discontinued     3.375 g100 mL/hr over 30 Minutes Intravenous   Once 01/07/14 1802 01/07/14 2107   01/07/14 1745  vancomycin (VANCOCIN) IVPB 1000 mg/200 mL premix  Status:  Discontinued     1,000 mg200 mL/hr over 60 Minutes Intravenous  Once 01/07/14 1734 01/07/14 2106      Medications: Scheduled Meds: . aspirin EC  81 mg Oral QPM  . doxycycline  100 mg Oral Q12H  . feeding supplement (PRO-STAT SUGAR FREE 64)  30 mL Oral TID WC  . FLUoxetine  20 mg Oral Daily  . furosemide  20 mg Oral BID  . heparin  5,000 Units Subcutaneous 3 times per day  . insulin aspart  0-5 Units Subcutaneous QHS  . insulin aspart  0-9 Units Subcutaneous TID WC  . insulin glargine  30 Units Subcutaneous QHS  . levothyroxine  125 mcg Oral QAC breakfast  . sodium chloride  3 mL Intravenous Q12H   Continuous Infusions: . sodium chloride 10 mL/hr at 01/13/14 2055   PRN Meds:.HYDROcodone-acetaminophen, HYDROmorphone (DILAUDID) injection    Objective: Weight change: 7.1 oz (0.2 kg)  Intake/Output Summary (Last 24 hours) at 01/14/14 1540 Last data filed at 01/14/14 1510  Gross per 24 hour  Intake   1010 ml  Output    800 ml  Net    210 ml   Blood  pressure 154/36, pulse 54, temperature 98.1 F (36.7 C), temperature source Oral, resp. rate 18, height 5\' 1"  (1.549 m), weight 194 lb 7.1 oz (88.2 kg), SpO2 96 %. Temp:  [98 F (36.7 C)-98.1 F (36.7 C)] 98.1 F (36.7 C) (11/05 0554) Pulse Rate:  [54-60] 54 (11/05 0554) Resp:  [18] 18 (11/05 0554) BP: (140-154)/(33-40) 154/36 mmHg (11/05 0554) SpO2:  [96 %] 96 % (11/05 0554) Weight:  [194 lb (87.998 kg)-194 lb 7.1 oz (88.2 kg)] 194 lb (87.998 kg) (11/05 1411)  Physical Exam: General: Alert and awake, oriented x3, not in any acute distress. HEENT: anicteric sclera, EOMI, oropharynx clear and without exudate CVS regular rate, normal r, no murmur rubs or gallops Chest: clear to auscultation bilaterally, no wheezing, rales or rhonchi Abdomen: soft nontender, nondistended, normal bowel sounds, Extremities: Faint DP  pulse on left, difficult to palpate on the right and she is tender here  She has what appears to be chronic venous changess with superimposed erythema acutely, scaling she has darkened blackened area on dorsum of foot c/w near 4th toe ischemic changes and she is exquisitely tender to palpation in this area. See pictures  01/13/14:  RIGHT LEG      01/14/14      RIGHT FOOT   01/13/14      01/14/14      CBC:  Recent Labs Lab 01/10/14 0540 01/11/14 0522 01/12/14 0625 01/13/14 0645 01/14/14 0728  HGB 9.8* 9.8* 10.4* 10.1* 10.3*  HCT 30.4* 30.5* 32.4* 31.1* 32.0*  PLT 216 222 234 229 213     BMET  Recent Labs  01/11/14 2027 01/14/14 0728  NA 134* 138  K 4.3 4.4  CL 97 101  CO2 24 23  GLUCOSE 107* 51*  BUN 38* 42*  CREATININE 1.44* 1.45*  CALCIUM 8.9 9.4     Liver Panel   Recent Labs  01/14/14 0728  ALBUMIN 2.7*       Sedimentation Rate  Recent Labs  01/14/14 0728  ESRSEDRATE 50*   C-Reactive Protein No results for input(s): CRP in the last 72 hours.  Micro Results: Recent Results (from the past 720 hour(s))  Culture, blood (routine x 2)     Status: None   Collection Time: 01/07/14  4:30 PM  Result Value Ref Range Status   Specimen Description BLOOD RIGHT HAND  Final   Special Requests BOTTLES DRAWN AEROBIC ONLY 3CC  Final   Culture  Setup Time   Final    01/07/2014 22:58 Performed at [facility]   Final    NO GROWTH 5 DAYS Performed at [facility]    Report Status 01/13/2014 FINAL  Final  Culture, blood (routine x 2)     Status: None   Collection Time: 01/07/14  4:45 PM  Result Value Ref Range Status   Specimen Description BLOOD RIGHT WRIST  Final   Special Requests BOTTLES DRAWN AEROBIC AND ANAEROBIC 5CC EACH  Final   Culture  Setup Time   Final    01/07/2014 22:58 Performed at [facility]   Final    NO GROWTH 5 DAYS Performed at [facility]    Report  Status 01/13/2014 FINAL  Final  MRSA PCR Screening     Status: None   Collection Time: 01/07/14 10:08 PM  Result Value Ref Range Status   MRSA by PCR NEGATIVE NEGATIVE Final    Comment:  The GeneXpert MRSA Assay (FDA approved for NASAL specimens only), is one component of a comprehensive MRSA colonization surveillance program. It is not intended to diagnose MRSA infection nor to guide or monitor treatment for MRSA infections.  Wound culture     Status: None   Collection Time: 01/10/14 11:45 PM  Result Value Ref Range Status   Specimen Description WOUND RIGHT LEG  Final   Special Requests NONE  Final   Gram Stain   Final    RARE WBC PRESENT, PREDOMINANTLY PMN NO SQUAMOUS EPITHELIAL CELLS SEEN NO ORGANISMS SEEN Performed at [facility]    Culture   Final    FEW METHICILLIN RESISTANT STAPHYLOCOCCUS AUREUS Note: RIFAMPIN AND GENTAMICIN SHOULD NOT BE USED AS SINGLE DRUGS FOR TREATMENT OF STAPH INFECTIONS. This organism DOES NOT demonstrate inducible Clindamycin resistance in vitro. CRITICAL RESULT CALLED TO, READ BACK BY AND VERIFIED WITH: LINDA B @ 830 AM  01/13/14 BY MORAC Performed at [facility]    Report Status 01/13/2014 FINAL  Final   Organism ID, Bacteria METHICILLIN RESISTANT STAPHYLOCOCCUS AUREUS  Final      Susceptibility   Methicillin resistant staphylococcus aureus - MIC*    CLINDAMYCIN <=0.25 SENSITIVE Sensitive     ERYTHROMYCIN >=8 RESISTANT Resistant     GENTAMICIN <=0.5 SENSITIVE Sensitive     LEVOFLOXACIN >=8 RESISTANT Resistant     OXACILLIN >=4 RESISTANT Resistant     PENICILLIN >=0.5 RESISTANT Resistant     RIFAMPIN <=0.5 SENSITIVE Sensitive     TRIMETH/SULFA >=320 RESISTANT Resistant     VANCOMYCIN 1 SENSITIVE Sensitive     TETRACYCLINE <=1 SENSITIVE Sensitive     * FEW METHICILLIN RESISTANT STAPHYLOCOCCUS AUREUS  Clostridium Difficile by PCR     Status: None   Collection Time: 01/12/14  7:38 PM  Result Value Ref Range Status     C difficile by pcr NEGATIVE NEGATIVE Final    Studies/Results: Mr Shoulder Left Wo Contrast  01/13/2014   CLINICAL DATA:  Left shoulder pain with inability to move the shoulder and arm.  EXAM: MRI OF THE LEFT SHOULDER WITHOUT CONTRAST  TECHNIQUE: Multiplanar, multisequence MR imaging of the shoulder was performed. No intravenous contrast was administered.  COMPARISON:  None.  FINDINGS: Rotator cuff: The supraspinatus tendon is severely degenerated with an intrasubstance tear at the anterior aspect of the distal tendon and a 60% thickness bursal surface tear at the musculotendinous junction.  The other tendons of the rotator cuff are intact.  Muscles: There is moderately severe atrophy of the infraspinatus and supraspinatus and teres minor muscles with moderate atrophy of the subscapularis muscle.  Biceps long head:  Properly located and intact.  Acromioclavicular Joint: Moderate arthropathy. Normal type 2 acromion.  Glenohumeral Joint: Severe osteoarthritis with diffuse full-thickness cartilage loss on the glenoid and humeral head. Flattening and subcortical edema and focal avascular necrosis of the humeral head is present. Moderate joint effusion.  Labrum: Diffusely degenerated. Motion artifact limits assessment of the degenerated labrum but I see no discrete tear.  Bones:  The extra-articular bones are normal.  IMPRESSION: 1. Severe osteoarthritis of the glenohumeral joint with flattening and avascular necrosis of the humeral head. 2. Severe degeneration of the supraspinatus tendon with multiple tears without retraction.   Electronically Signed   By: Rozetta Nunnery M.D.   On: 01/13/2014 09:24   Mri Right Foot Without Contrast  01/14/2014   CLINICAL DATA:  Chronic lower extremity diabetic ulcers. Cellulitis and hyperkalemia.  EXAM: MRI OF THE RIGHT  FOREFOOT WITHOUT CONTRAST  TECHNIQUE: Multiplanar, multisequence MR imaging was performed. No intravenous contrast was administered.  COMPARISON:  Radiographs  01/13/2014  FINDINGS: Diffuse subcutaneous soft tissue swelling/ edema suggesting cellulitis. There is also diffuse myositis. No focal fluid collections to suggest an abscess. Midfoot, forefoot and hindfoot degenerative changes but no stress fracture or evidence of avascular necrosis. The major tendons and ligaments are intact.  No findings to suggest septic arthritis or osteomyelitis.  IMPRESSION: Diffuse cellulitis and myositis but no focal drainable soft tissue abscess, septic arthritis or osteomyelitis.   Electronically Signed   By: Kalman Jewels M.D.   On: 01/14/2014 15:17   Dg Foot Complete Right  01/13/2014   CLINICAL DATA:  Diabetic foot ulcer with pain.  EXAM: RIGHT FOOT COMPLETE - 3+ VIEW  COMPARISON:  None.  FINDINGS: There is mild diffuse decreased bone mineralization. Moderate hallux valgus deformity is present. There are mild degenerative changes of the midfoot. There are moderate degenerative changes of the first and second tarsal metatarsal joints. Mild degenerative change of the first MTP joint. No fracture or dislocation. No definite bone destruction to suggest osteomyelitis. Small vessel atherosclerotic disease is present.  IMPRESSION: No definite evidence of osteomyelitis.  Degenerative changes as described with moderate hallux valgus deformity.   Electronically Signed   By: Marin Olp M.D.   On: 01/13/2014 10:54      Assessment/Plan:  Principal Problem:   Cellulitis of leg, right Active Problems:   DM type 2 causing renal disease   Anemia, iron deficiency   PVD (peripheral vascular disease)   Hyperkalemia   Morbid obesity   Essential hypertension   Hypothyroidism   Depression   History of colon cancer   History of colectomy   Diabetic foot infection   Cellulitis of right lower extremity   Atherosclerotic peripheral vascular disease with ulceration    Sandra [name] is a 80 y.o. female with PVD, DM (well controlled) diabetic foot infection, ischemic changes and  celllulitis  #1 Diabetic foot infection with ischemic changes on exam that per patient are new and appears to be recovering from cellulitis. Her Plain films do NOT show osteomyelitis or deep infection, AND MRI DONE THIS AFTERNOON ALSO DOES NOT SHOW DEEP OSTEO OR ABSCESS  --SWITCH TO ORAL  DOXYCYCLINE one tablet BID to finish additional 7 day  --Would apprise Dr. Scot Dock and [facility] of her situation --She will need outpatient follwoup with her PCP, [place] [place], Dr. Scot Dock  I am also happy to see the patient in followup IF needed.  I will sign off for now.   LOS: 7 days   Alcide Evener 01/14/2014, 3:40 PM

## 2014-01-14 NOTE — Discharge Instructions (Signed)
Cellulitis Cellulitis is an infection of the skin and the tissue under the skin. The infected area is usually red and tender. This happens most often in the arms and lower legs. HOME CARE   Take your antibiotic medicine as told. Finish the medicine even if you start to feel better.  Keep the infected arm or leg raised (elevated).  Put a warm cloth on the area up to 4 times per day.  Only take medicines as told by your doctor.  Keep all doctor visits as told. GET HELP IF:  You see red streaks on the skin coming from the infected area.  Your red area gets bigger or turns a dark color.  Your bone or joint under the infected area is painful after the skin heals.  Your infection comes back in the same area or different area.  You have a puffy (swollen) bump in the infected area.  You have new symptoms.  You have a fever. GET HELP RIGHT AWAY IF:   You feel very sleepy.  You throw up (vomit) or have watery poop (diarrhea).  You feel sick and have muscle aches and pains. MAKE SURE YOU:   Understand these instructions.  Will watch your condition.  Will get help right away if you are not doing well or get worse. Document Released: 08/15/2007 Document Revised: 07/13/2013 Document Reviewed: 05/14/2011 [facility] Patient Information 2015 [place], [place]. This information is not intended to replace advice given to you by your health care provider. Make sure you discuss any questions you have with your health care provider.   Diabetes and Foot Care Diabetes may cause you to have problems because of poor blood supply (circulation) to your feet and legs. This may cause the skin on your feet to become thinner, break easier, and heal more slowly. Your skin may become dry, and the skin may peel and crack. You may also have nerve damage in your legs and feet causing decreased feeling in them. You may not notice minor injuries to your feet that could lead to infections or more serious  problems. Taking care of your feet is one of the most important things you can do for yourself.  HOME CARE INSTRUCTIONS  Wear shoes at all times, even in the house. Do not go barefoot. Bare feet are easily injured.  Check your feet daily for blisters, cuts, and redness. If you cannot see the bottom of your feet, use a mirror or ask someone for help.  Wash your feet with warm water (do not use hot water) and mild soap. Then pat your feet and the areas between your toes until they are completely dry. Do not soak your feet as this can dry your skin.  Apply a moisturizing lotion or petroleum jelly (that does not contain alcohol and is unscented) to the skin on your feet and to dry, brittle toenails. Do not apply lotion between your toes.  Trim your toenails straight across. Do not dig under them or around the cuticle. File the edges of your nails with an emery board or nail file.  Do not cut corns or calluses or try to remove them with medicine.  Wear clean socks or stockings every day. Make sure they are not too tight. Do not wear knee-high stockings since they may decrease blood flow to your legs.  Wear shoes that fit properly and have enough cushioning. To break in new shoes, wear them for just a few hours a day. This prevents you from injuring your  feet. Always look in your shoes before you put them on to be sure there are no objects inside.  Do not cross your legs. This may decrease the blood flow to your feet.  If you find a minor scrape, cut, or break in the skin on your feet, keep it and the skin around it clean and dry. These areas may be cleansed with mild soap and water. Do not cleanse the area with peroxide, alcohol, or iodine.  When you remove an adhesive bandage, be sure not to damage the skin around it.  If you have a wound, look at it several times a day to make sure it is healing.  Do not use heating pads or hot water bottles. They may burn your skin. If you have lost feeling  in your feet or legs, you may not know it is happening until it is too late.  Make sure your health care provider performs a complete foot exam at least annually or more often if you have foot problems. Report any cuts, sores, or bruises to your health care provider immediately. SEEK MEDICAL CARE IF:   You have an injury that is not healing.  You have cuts or breaks in the skin.  You have an ingrown nail.  You notice redness on your legs or feet.  You feel burning or tingling in your legs or feet.  You have pain or cramps in your legs and feet.  Your legs or feet are numb.  Your feet always feel cold. SEEK IMMEDIATE MEDICAL CARE IF:   There is increasing redness, swelling, or pain in or around a wound.  There is a red line that goes up your leg.  Pus is coming from a wound.  You develop a fever or as directed by your health care provider.  You notice a bad smell coming from an ulcer or wound. Document Released: 02/24/2000 Document Revised: 10/29/2012 Document Reviewed: 08/05/2012 [facility] Patient Information 2015 [place], [place]. This information is not intended to replace advice given to you by your health care provider. Make sure you discuss any questions you have with your health care provider.

## 2014-01-14 NOTE — Progress Notes (Signed)
Hypoglycemic Event  CBG: 56   Treatment: 15 GM carbohydrate snack  Symptoms: None  Follow-up CBG: Time:843 CBG Result:80  Possible Reasons for Event: Inadequate meal intake  Comments/MD notified:    Henriette Combs  Remember to initiate Hypoglycemia Order Set & complete

## 2014-01-14 NOTE — Clinical Social Work Note (Signed)
Per MD patient ready to DC back to her independent living facility [facility]). RN, patient/family, and facility notified of patient's DC. DC packet on patient's chart. Patient's addressed confirmed by patient. Ambulance transport requested for patient. CSW signing off at this time.   Liz Beach MSW, [place], [place], [phone]

## 2014-01-14 NOTE — Plan of Care (Signed)
Problem: Phase III Progression Outcomes Goal: Temperature < 100 Outcome: Completed/Met Date Met:  01/14/14

## 2014-01-26 ENCOUNTER — Ambulatory Visit (INDEPENDENT_AMBULATORY_CARE_PROVIDER_SITE_OTHER): Payer: Medicare Other | Admitting: Podiatry

## 2014-01-26 ENCOUNTER — Ambulatory Visit: Payer: Medicare Other | Admitting: Podiatry

## 2014-01-26 VITALS — BP 145/89 | HR 88 | Resp 16

## 2014-01-26 DIAGNOSIS — B351 Tinea unguium: Secondary | ICD-10-CM

## 2014-01-26 DIAGNOSIS — M79676 Pain in unspecified toe(s): Secondary | ICD-10-CM

## 2014-01-26 NOTE — Progress Notes (Signed)
She presents today with chief complaint of painful elongated toenails. Has recently been in the hospital with peripheral vascular disease to the right foot and cellulitis to the right foot. Wound care is taking care of her cellulitis and her foot. She just would like for [place] to cut her nails.  Objective: Pulses are nonpalpable right foot. No erythema edema cellulitis  drainage or odor. Nails are thick yellow dystrophic onychomycotic painful palpation..  Assessment: Pain in limb secondary to onychomycosis 1 through 5 bilateral.  Plan: Debridement of nails 1 through 5 bilateral.

## 2014-01-27 ENCOUNTER — Encounter (HOSPITAL_BASED_OUTPATIENT_CLINIC_OR_DEPARTMENT_OTHER): Payer: Medicare Other | Attending: General Surgery

## 2014-01-27 DIAGNOSIS — E11622 Type 2 diabetes mellitus with other skin ulcer: Secondary | ICD-10-CM | POA: Insufficient documentation

## 2014-01-27 DIAGNOSIS — L98492 Non-pressure chronic ulcer of skin of other sites with fat layer exposed: Secondary | ICD-10-CM | POA: Insufficient documentation

## 2014-01-27 DIAGNOSIS — L97912 Non-pressure chronic ulcer of unspecified part of right lower leg with fat layer exposed: Secondary | ICD-10-CM | POA: Insufficient documentation

## 2014-01-27 DIAGNOSIS — I7025 Atherosclerosis of native arteries of other extremities with ulceration: Secondary | ICD-10-CM | POA: Insufficient documentation

## 2014-02-08 ENCOUNTER — Ambulatory Visit (HOSPITAL_BASED_OUTPATIENT_CLINIC_OR_DEPARTMENT_OTHER): Payer: Medicare Other

## 2014-02-08 VITALS — BP 136/63 | HR 68 | Temp 98.2°F | Resp 18

## 2014-02-08 DIAGNOSIS — D509 Iron deficiency anemia, unspecified: Secondary | ICD-10-CM

## 2014-02-08 MED ORDER — SODIUM CHLORIDE 0.9 % IV SOLN
INTRAVENOUS | Status: DC
  Administered 2014-02-08: 15:00:00 via INTRAVENOUS

## 2014-02-08 MED ORDER — SODIUM CHLORIDE 0.9 % IV SOLN
1020.0000 mg | Freq: Once | INTRAVENOUS | Status: AC
  Administered 2014-02-08: 1020 mg via INTRAVENOUS
  Filled 2014-02-08: qty 34

## 2014-02-08 NOTE — Patient Instructions (Signed)

## 2014-02-10 ENCOUNTER — Encounter (HOSPITAL_BASED_OUTPATIENT_CLINIC_OR_DEPARTMENT_OTHER): Payer: Medicare Other | Attending: General Surgery

## 2014-02-10 DIAGNOSIS — L97519 Non-pressure chronic ulcer of other part of right foot with unspecified severity: Secondary | ICD-10-CM | POA: Insufficient documentation

## 2014-02-10 DIAGNOSIS — E11621 Type 2 diabetes mellitus with foot ulcer: Secondary | ICD-10-CM | POA: Insufficient documentation

## 2014-02-17 DIAGNOSIS — L97519 Non-pressure chronic ulcer of other part of right foot with unspecified severity: Secondary | ICD-10-CM | POA: Diagnosis not present

## 2014-02-17 DIAGNOSIS — E11621 Type 2 diabetes mellitus with foot ulcer: Secondary | ICD-10-CM | POA: Diagnosis not present

## 2014-02-18 ENCOUNTER — Encounter (HOSPITAL_COMMUNITY): Payer: Self-pay | Admitting: Vascular Surgery

## 2014-02-24 DIAGNOSIS — L97519 Non-pressure chronic ulcer of other part of right foot with unspecified severity: Secondary | ICD-10-CM | POA: Diagnosis not present

## 2014-02-24 DIAGNOSIS — E11621 Type 2 diabetes mellitus with foot ulcer: Secondary | ICD-10-CM | POA: Diagnosis not present

## 2014-03-03 DIAGNOSIS — E11621 Type 2 diabetes mellitus with foot ulcer: Secondary | ICD-10-CM | POA: Diagnosis not present

## 2014-03-03 DIAGNOSIS — L97519 Non-pressure chronic ulcer of other part of right foot with unspecified severity: Secondary | ICD-10-CM | POA: Diagnosis not present

## 2014-03-10 DIAGNOSIS — E11621 Type 2 diabetes mellitus with foot ulcer: Secondary | ICD-10-CM | POA: Diagnosis not present

## 2014-03-10 DIAGNOSIS — L97519 Non-pressure chronic ulcer of other part of right foot with unspecified severity: Secondary | ICD-10-CM | POA: Diagnosis not present

## 2014-03-17 ENCOUNTER — Encounter (HOSPITAL_BASED_OUTPATIENT_CLINIC_OR_DEPARTMENT_OTHER): Payer: Medicare Other | Attending: General Surgery

## 2014-03-17 DIAGNOSIS — E11621 Type 2 diabetes mellitus with foot ulcer: Secondary | ICD-10-CM | POA: Insufficient documentation

## 2014-03-17 DIAGNOSIS — I878 Other specified disorders of veins: Secondary | ICD-10-CM | POA: Diagnosis not present

## 2014-03-17 DIAGNOSIS — L97514 Non-pressure chronic ulcer of other part of right foot with necrosis of bone: Secondary | ICD-10-CM | POA: Insufficient documentation

## 2014-03-17 DIAGNOSIS — R600 Localized edema: Secondary | ICD-10-CM | POA: Insufficient documentation

## 2014-03-24 DIAGNOSIS — L97514 Non-pressure chronic ulcer of other part of right foot with necrosis of bone: Secondary | ICD-10-CM | POA: Diagnosis not present

## 2014-03-24 DIAGNOSIS — E11621 Type 2 diabetes mellitus with foot ulcer: Secondary | ICD-10-CM | POA: Diagnosis not present

## 2014-03-24 DIAGNOSIS — R600 Localized edema: Secondary | ICD-10-CM | POA: Diagnosis not present

## 2014-03-24 DIAGNOSIS — I878 Other specified disorders of veins: Secondary | ICD-10-CM | POA: Diagnosis not present

## 2014-04-07 DIAGNOSIS — E11621 Type 2 diabetes mellitus with foot ulcer: Secondary | ICD-10-CM | POA: Diagnosis not present

## 2014-04-08 NOTE — H&P (Signed)
NAMEMarland Kitchen  [name], Sandra [name]                  ACCOUNT NO.:  [id number]  MEDICAL RECORD NO.:  [id number]  LOCATION:                            FACILITY:  [facility]  PHYSICIAN:  Christin Fudge, MD       DATE OF BIRTH:  05-11-1930  DATE OF ADMISSION: DATE OF DISCHARGE:                             HISTORY & PHYSICAL   This pleasant 81 year old patient comes back to the wound center after 2 weeks having recently seen Dr. Meridee Score at [facility].  He first saw her on 03/30/2014 and discussed that she had arterial insufficiency, venous insufficiency, and severe drainage from the right lower extremity.  As far as the bunion of the great toe MTP joint, he said this does not probe down to bone and there is no cellulitis and did not recommend any surgical intervention.  He had recommended an Unna boot and Dynaflex wrap and asked her to continue with Santyl.  He saw her back again on 04/06/2014 and recommended that they stop the Unna boot as the venostasis has completely resolved and said that she should continue applying Santyl dressing and continue antibiotics for about 2 weeks and he would see her back.  The patient comes back today for a review and says her legs are weeping quite significantly and she has got pain in the legs.  On clinical examination, her vital signs are stable and she is afebrile. She is noted to have a large ulcer on the right first metatarsal, which is covered with slough.  Her leg shows brawny edema with some element of redness and there is some weeping.  There is significant swelling of the lower extremity on the right side.  After a prolonged discussion, it was decided to continue with application of Santyl locally and we would apply a light wrap with Coban on this extremity.  In view of the fact that her assessment had shown that her ABI on that leg is only 0.6, I do not recommend an Haematologist.  She is agreeable and will continue her Cipro as prescribed  earlier.          ______________________________ Christin Fudge, MD     EB/MEDQ  D:  04/07/2014  T:  04/08/2014  Job:  [id number]

## 2014-04-14 ENCOUNTER — Encounter (HOSPITAL_BASED_OUTPATIENT_CLINIC_OR_DEPARTMENT_OTHER): Payer: Medicare Other | Attending: Surgery

## 2014-04-14 DIAGNOSIS — E11621 Type 2 diabetes mellitus with foot ulcer: Secondary | ICD-10-CM | POA: Diagnosis present

## 2014-04-14 DIAGNOSIS — L97512 Non-pressure chronic ulcer of other part of right foot with fat layer exposed: Secondary | ICD-10-CM | POA: Insufficient documentation

## 2014-04-14 DIAGNOSIS — E1151 Type 2 diabetes mellitus with diabetic peripheral angiopathy without gangrene: Secondary | ICD-10-CM | POA: Diagnosis not present

## 2014-04-15 NOTE — H&P (Signed)
NAME:  Sandra [name], Sandra [name]                  ACCOUNT NO.:  [id number]  MEDICAL RECORD NO.:  [id number]  LOCATION:  FOOT                         FACILITY:  [facility]  PHYSICIAN:  Christin Fudge, MD       DATE OF BIRTH:  [birth year]  DATE OF ADMISSION:  04/14/2014 DATE OF DISCHARGE:                             HISTORY & PHYSICAL   SUBJECTIVE:  This pleasant 81 year old patient comes today to review after I had seen her last week and comes mainly with an ulcer on the right 1st metatarsal head medially.  She subjectively finds that the weeping is  much less and the pain is much less.  OBJECTIVE FINDINGS:  She is doing well, awake and alert, and in no acute distress.  She is 5 feet 1 inch in height, 207 pounds,.  blood pressure 150/82, and vital signs are otherwise stable.  She has an ulcer on the medial part of her right foot which is 3.0 x  0.1 cm and has some slough.  Her right lower extremity is much better today and there is no weeping of fluid.  There is some slough around the edges of the wound which needs sharp debridement and she is agreeable.  PROCEDURE NOTE:  After an appropriate time-out and lidocaine application, I have done a sharp debridement of the selective type with forceps and scissors and removed necrotic tissue, eschar, and some debris.  There was no bleeding.  PLAN AND RECOMMENDATION:  We will continue with Santyl and apply wrap with Coban and Kerlix on the leg.  Notice is made of the fact that vascular surgeon had seen her while ago and he had recommended that she follow up with him after about 6 months.  The patient says the last time she saw him was a while ago and it may be of July of last year.  She is agreeable to go and see him and the surgeon's name is Dr. Angelia Mould.  When he last saw on September 23, 2013 he had made note that she has chronic venous insufficiency of the right lower extremity with cellulitis and arteriogram done a while ago had shown that she was  a candidate for fem to below-knee popliteal bypass grafting.  However, the patient wanted to have cleared by cardiology and to discuss with the family and had wished to see her back in 6 months and she has not gone yet.  I have recommended the patient see Dr. Scot Dock as soon as possible and she is agreeable.          ______________________________ Christin Fudge, MD     EB/MEDQ  D:  04/14/2014  T:  04/15/2014  Job:  [id number]

## 2014-04-21 ENCOUNTER — Ambulatory Visit: Payer: Medicare Other | Admitting: Vascular Surgery

## 2014-04-21 ENCOUNTER — Other Ambulatory Visit: Payer: Self-pay | Admitting: *Deleted

## 2014-04-21 ENCOUNTER — Encounter (HOSPITAL_COMMUNITY): Payer: Medicare Other

## 2014-04-21 DIAGNOSIS — E11621 Type 2 diabetes mellitus with foot ulcer: Secondary | ICD-10-CM | POA: Diagnosis not present

## 2014-04-21 DIAGNOSIS — I739 Peripheral vascular disease, unspecified: Secondary | ICD-10-CM

## 2014-04-22 ENCOUNTER — Other Ambulatory Visit (HOSPITAL_COMMUNITY): Payer: Medicare Other

## 2014-04-22 ENCOUNTER — Ambulatory Visit (HOSPITAL_COMMUNITY)
Admission: RE | Admit: 2014-04-22 | Discharge: 2014-04-22 | Disposition: A | Discharge status: Home / Self Care | Payer: Medicare Other | Source: Ambulatory Visit | Attending: Vascular Surgery | Admitting: Vascular Surgery

## 2014-04-22 ENCOUNTER — Encounter (HOSPITAL_COMMUNITY): Payer: Medicare Other

## 2014-04-22 DIAGNOSIS — I739 Peripheral vascular disease, unspecified: Secondary | ICD-10-CM

## 2014-04-23 NOTE — H&P (Signed)
NAME:  Sandra [name], Sandra [name]                       ACCOUNT NO.:  MEDICAL RECORD NO.:  [id number]  LOCATION:                                 FACILITY:  PHYSICIAN:  Christin Fudge, MD       DATE OF BIRTH:  [birth year]  DATE OF ADMISSION: DATE OF DISCHARGE:                             HISTORY & PHYSICAL   SUBJECTIVE: This is a pleasant 81 year old patient, who comes today for a review after she was recently seen by her orthopedic doctor, Dr. Sharol Given.  She has an  appointment to see a vascular surgeon tomorrow.  The patient comes with ulceration on the right first metatarsal head medially and has been having this for several months.  She has also had a lot of edema and weeping of her lower extremity but has been doing very much better since treatment with [place] a few weeks ago.  OBJECTIVE:  She is awake and alert, laying comfortably in bed.  Vital signs are stable and she is afebrile.  She is 5 feet 1 inch in height, 207 pounds,  temperature is 97.9, pulse is 72, respiration is 20, blood pressure is 153/45 and blood glucose was 108.  On examination of her lower extremities, the edema of her limbs is much less and there is no weeping from her legs.  The actual ulceration on the medial part of her right foot near the metatarsal is 2.4 x 3.0 x 0.1 and is a Wagner grade 3.    PROCEDURE: I have curetted this after an appropriate time out and placed application of lidocaine.  The actual debrided area was 2.4 x 3.0 and the post debridement measurements were the same.  The area did not bleed.  ASSESSMENT AND PLAN:  For this Wagner grade 3 diabetic foot ulcer, the recommendation would be to continue with Santyl and due to her edema, I have recommended Coban and Kerlix dressing to be applied for light compression.  She will see her vascular surgeon tomorrow and between the vascular surgeon and orthopedic surgeon, they will decide on the treatment plan for a surgical  intervention.  In the meanwhile, she  will continue to see her for wound care and has been extremely happy with the progress so far.          ______________________________ Christin Fudge, MD     EB/MEDQ  D:  04/21/2014  T:  04/22/2014  Job:  [id number]  cc:   Wound Care Office

## 2014-04-28 ENCOUNTER — Ambulatory Visit: Payer: Medicare Other | Admitting: Vascular Surgery

## 2014-04-28 DIAGNOSIS — E11621 Type 2 diabetes mellitus with foot ulcer: Secondary | ICD-10-CM | POA: Diagnosis not present

## 2014-04-29 NOTE — H&P (Signed)
NAMEEILEE, SCHADER                  ACCOUNT NO.:  [id number]  MEDICAL RECORD NO.:  [id number]  LOCATION:  FOOT                         FACILITY:  [facility]  PHYSICIAN:  Christin Fudge, MD       DATE OF BIRTH:  09-Jul-1930  DATE OF ADMISSION:  04/14/2014 DATE OF DISCHARGE:                             HISTORY & PHYSICAL   SUBJECTIVE:  This pleasant 81 year old patient seems in a better mood today and she has occasional pain in the right lower extremity.  She has recently had a vascular study, which essentially shows that she has got bilateral ABIs and Doppler waveforms suggestive of severe decreased arterial perfusion of the right lower extremity and moderately decreased arterial perfusion of the left lower extremity.  The ABI on the right was 0.52 and ABI on the left was 0.63.  The patient has not seen the vascular surgeon, who is due to see her in the first week of March.  She is due to see orthopedic surgeon in early March too.  OBJECTIVE:  GENERAL:  On clinical examination, she is awake and alert, lying comfortably in bed, and seems to be in some distress when she tries to move around. VITAL SIGNS:  Temperature is 97.7, pulse 68, respirations 20, and blood pressure is 146/45.  Her height is 5 feet and 1 inch, and her weight is 207 pounds, and her blood glucose today is 88. EXTREMITIES:  Her ulceration on the right medial foot, which is a Wagner grade 3 shows an ulcer of 2.2 x 2.5 x 0.1 and it is a clean ulcer with minimal biofilm and no slough.  I have cleansed this with saline and lightly rubbed against just to remove the biofilm.  On the right lateral leg, there is a small new ulceration which is 1.9 x 1.0 x 0.1.  It has got a clean base and I have not debrided this as there is no slough.  ASSESSMENT: 1. Wagner grade 3 diabetic foot ulcer. 2. Severe peripheral vascular disease, right lower extremity being     worse than the left lower extremity. 3. Diabetes mellitus.  PLAN AND  RECOMMENDATIONS:  As discussed with her, we will continue local care and at this stage, we do not need Santyl and we will change her over to Aquacel Ag.  We will continue to lightly wrapped her leg and I note that the edema is much better with a light layer of wrap.  Other than that, we have discussed her management and she is agreeable to keep her other appointments.  Due to scheduling difficulties, we will see her out in 2 weeks' time and we will await the reports of the vascular surgeon and the orthopedic surgeon.  She is a complex care patient and we have appropriately documented this.         ______________________________ Christin Fudge, MD    EB/MEDQ  D:  04/28/2014  T:  04/29/2014  Job:  [id number]  cc:   Wound Care Office

## 2014-05-12 ENCOUNTER — Encounter (HOSPITAL_BASED_OUTPATIENT_CLINIC_OR_DEPARTMENT_OTHER): Payer: Medicare Other | Attending: Surgery

## 2014-05-12 ENCOUNTER — Ambulatory Visit: Payer: Medicare Other | Admitting: Vascular Surgery

## 2014-05-12 DIAGNOSIS — E11622 Type 2 diabetes mellitus with other skin ulcer: Secondary | ICD-10-CM | POA: Diagnosis not present

## 2014-05-12 DIAGNOSIS — I872 Venous insufficiency (chronic) (peripheral): Secondary | ICD-10-CM | POA: Insufficient documentation

## 2014-05-12 DIAGNOSIS — L97921 Non-pressure chronic ulcer of unspecified part of left lower leg limited to breakdown of skin: Secondary | ICD-10-CM | POA: Insufficient documentation

## 2014-05-12 DIAGNOSIS — L97811 Non-pressure chronic ulcer of other part of right lower leg limited to breakdown of skin: Secondary | ICD-10-CM | POA: Diagnosis not present

## 2014-05-12 DIAGNOSIS — L89892 Pressure ulcer of other site, stage 2: Secondary | ICD-10-CM | POA: Insufficient documentation

## 2014-05-18 ENCOUNTER — Encounter: Payer: Self-pay | Admitting: Vascular Surgery

## 2014-05-19 ENCOUNTER — Ambulatory Visit (INDEPENDENT_AMBULATORY_CARE_PROVIDER_SITE_OTHER): Payer: Medicare Other | Admitting: Vascular Surgery

## 2014-05-19 ENCOUNTER — Encounter: Payer: Self-pay | Admitting: Vascular Surgery

## 2014-05-19 VITALS — BP 135/46 | HR 74 | Temp 97.9°F | Resp 20 | Ht 61.0 in | Wt 207.0 lb

## 2014-05-19 DIAGNOSIS — I872 Venous insufficiency (chronic) (peripheral): Secondary | ICD-10-CM

## 2014-05-19 DIAGNOSIS — I70209 Unspecified atherosclerosis of native arteries of extremities, unspecified extremity: Secondary | ICD-10-CM

## 2014-05-19 DIAGNOSIS — L98499 Non-pressure chronic ulcer of skin of other sites with unspecified severity: Secondary | ICD-10-CM | POA: Diagnosis not present

## 2014-05-19 DIAGNOSIS — I739 Peripheral vascular disease, unspecified: Secondary | ICD-10-CM | POA: Diagnosis not present

## 2014-05-19 NOTE — Progress Notes (Signed)
[facility]ist [facility]  Patient name: Sandra [name] MRN: [id number] DOB: September 14, 1930 Sex: female  REASON FOR VISIT: Follow up of bilateral lower extremity wounds.  HPI: Sandra [name] is a 81 y.o. female who I last saw in July 2015. She has had wounds of both lower extremities. I originally saw her in December 2014. At that time she had reasonable flow in the left side. Flow on the right side was compromised and we considered a right femoropopliteal bypass graft however I felt that she would be at very high-risk because of her age, obesity, and comorbidities. Her wounds were gradually improving and therefore elected not to proceed with revascularization. When I saw her in July, she did not have any open wounds. We were continuing with conservative treatment. It had been able while since she had her previous arteriogram and if we were to consider revascularization in the future the plan would be to repeat her study.  Of note, I did review the arteriogram from June 2014. There was no significant aortoiliac occlusive disease. On the right side the common femoral and deep femoral artery were patent. The superficial femoral artery was occluded proximally with reconstitution of the above-knee popliteal artery. There was single-vessel runoff via the anterior tibial artery on the right. On the left side the common femoral and deep femoral artery were patent. There was mild diffuse disease of the superficial femoral artery. The left popliteal artery was patent. There was anterior tibial runoff on the left. Peroneal and posterior tibial arteries on the left were occluded.  Since I saw her last, she has developed a wound on her metatarsal head of the right great toe. There is also a wound on the right second toe. She has significant edema of both lower extremities and cellulitis of the right leg. There are no wounds on the left foot. I have reviewed her records from the wound care center. She  continues aggressive outpatient wound care. I have also reviewed her records from Dr. Jess Barters office. He felt that if she was a candidate for revascularization she could potentially heal a first ray amputation. If she was not a candidate she would require more proximal amputation. She was started on Cipro. This visit was on 05/11/2014.  Her activity is very limited and she has significant dyspnea with any activity. I do not get any clear-cut history of rest pain in the foot.   Past Medical History  Diagnosis Date  . Anemia   . Diabetes mellitus   . CAD (coronary artery disease)   . Peripheral vascular disease   . Hypertension   . Cancer     colon; s/p colectomy 2007  . Anemia, iron deficiency 01/17/2012  . Hemorrhoids   . Nosebleed   . Stroke   . Cellulitis 03/08/12   Family History  Problem Relation Age of Onset  . Cancer Mother   . Diabetes Mother   . Other Father     bleeding problems  . Diabetes Sister   . Heart disease Daughter     before age 87  . Cancer Son    SOCIAL HISTORY: History  Substance Use Topics  . Smoking status: Former Smoker -- 1.00 packs/day for 13 years    Types: Cigarettes    Start date: 01/09/1955    Quit date: 03/13/1967  . Smokeless tobacco: Never Used     Comment: quit  45 years ago  . Alcohol Use: No   Allergies  Allergen Reactions  .  Blackberry [Rubus Fruticosus]     Can't take because of Cancer  . Meloxicam Shortness Of Breath  . Procaine Hcl Swelling  . Meloxicam    Current Outpatient Prescriptions  Medication Sig Dispense Refill  . Amino Acids-Protein Hydrolys (FEEDING SUPPLEMENT, PRO-STAT SUGAR FREE 64,) LIQD Take 30 mLs by mouth 3 (three) times daily with meals. 900 mL 0  . amLODipine (NORVASC) 5 MG tablet Take 5 mg by mouth daily.    Marland Kitchen aspirin EC 81 MG tablet Take 81 mg by mouth every evening.    . BD PEN NEEDLE NANO U/F 32G X 4 MM MISC     . cholecalciferol (VITAMIN D) 1000 UNITS tablet Take 1,000 Units by mouth daily.    .  ciprofloxacin (CIPRO) 500 MG tablet Take 500 mg by mouth 2 (two) times daily.    Marland Kitchen FLUoxetine (PROZAC) 20 MG tablet Take 20 mg by mouth daily.    . furosemide (LASIX) 20 MG tablet Take 40 mg by mouth daily.     . Insulin Glargine (LANTUS SOLOSTAR) 100 UNIT/ML Solostar Pen Inject 40 Units into the skin daily at 10 pm. 40 units    . levothyroxine (SYNTHROID, LEVOTHROID) 125 MCG tablet Take 125 mcg by mouth. Synthroid ONlY    . losartan (COZAAR) 100 MG tablet Take 100 mg by mouth daily.    Marland Kitchen PRODIGY NO CODING BLOOD GLUC test strip     . doxycycline (VIBRA-TABS) 100 MG tablet Take 1 tablet (100 mg total) by mouth 2 (two) times daily. (Patient not taking: Reported on 05/19/2014) 14 tablet 0   No current facility-administered medications for this visit.   REVIEW OF SYSTEMS: Valu.Nieves ] denotes positive finding; [  ] denotes negative finding  CARDIOVASCULAR:  [ ]  chest pain   [ ]  chest pressure   [ ]  palpitations   Valu.Nieves ] orthopnea   Valu.Nieves ] dyspnea on exertion   [ ]  claudication   [ ]  rest pain   [ ]  DVT   [ ]  phlebitis PULMONARY:   [ ]  productive cough   [ ]  asthma   [ ]  wheezing NEUROLOGIC:   Valu.Nieves ] weakness  [ ]  paresthesias  [ ]  aphasia  [ ]  amaurosis  [ ]  dizziness HEMATOLOGIC:   [ ]  bleeding problems   [ ]  clotting disorders MUSCULOSKELETAL:  Valu.Nieves ] joint pain   [ ]  joint swelling [ ]  leg swelling GASTROINTESTINAL: [ ]   blood in stool  [ ]   hematemesis GENITOURINARY:  [ ]   dysuria  [ ]   hematuria PSYCHIATRIC:  [ ]  history of major depression INTEGUMENTARY:  [ ]  rashes  [ ]  ulcers CONSTITUTIONAL:  [ ]  fever   [ ]  chills  PHYSICAL EXAM: Filed Vitals:   05/19/14 1514  BP: 135/46  Pulse: 74  Temp: 97.9 F (36.6 C)  TempSrc: Oral  Resp: 20  Height: 5\' 1"  (1.549 m)  Weight: 207 lb (93.895 kg)  SpO2: 92%   Body mass index is 39.13 kg/(m^2). GENERAL: The patient is a well-nourished female, in no acute distress. The vital signs are documented above. CARDIOVASCULAR: There is a regular rate and rhythm. I  do not detect carotid bruits. She has palpable femoral pulses. I cannot palpate pedal pulses. She has significant bilateral lower extremity swelling. PULMONARY: There is good air exchange bilaterally without wheezing or rales. ABDOMEN: Soft and non-tender with normal pitched bowel sounds.  MUSCULOSKELETAL: There are no major deformities or cyanosis. NEUROLOGIC: No focal weakness or paresthesias are  detected. SKIN: she has a wound about the size of a quarter over her right metatarsal on the medial aspect of the foot. The right second toe also has a wound. PSYCHIATRIC: The patient has a normal affect.  DATA:  I have reviewed her previous arteriogram. The results are described above. This was over a year and a half ago.  I have reviewed her arterial Doppler study from 04/22/2014. She had an ABI 52% on the right and 63% on the left. She had a monophasic anterior tibial signal on the right with no posterior tibial or peroneal signal. On the left side she had a posterior tibial and anterior tibial signal which was monophasic.  MEDICAL ISSUES:  ATHEROSCLEROSIS WITH ULCERATION: This is a complicated situation in that the patient has a nonhealing wound of the right foot with significant infrainguinal arterial occlusive disease. She is 81 years old with a BMI of 40 and significant medical comorbidities. She would be at very high-risk for attempted revascularization with no guarantee of success. I have explained that I think the safest approach would be a right above-the-knee amputation given that it is unlikely she would be a candidate for a prosthesis because of her weight and age. We had a hard time even getting her into the chair to examine her. If she felt strongly about trying to salvage the right foot then I would need to repeat her arteriogram in order to determine if she is a candidate for revascularization. There is a small chance that the superficial femoral artery occlusive disease could be addressed  with an endovascular approach however she also has tibial artery occlusive disease and in June 2014 only had runoff via the anterior tibial artery. This may have also progressed. I've explained that revascularization would be associated with significant risk with no guarantee of success. If she were to elect to attempt limb salvage and certainly she would need preoperative evaluation by Dr. Marlou Porch, her cardiologist.    DICKSON,CHRISTOPHER S [facility] of [facility]: [phone]

## 2014-05-26 DIAGNOSIS — L89892 Pressure ulcer of other site, stage 2: Secondary | ICD-10-CM | POA: Diagnosis not present

## 2014-05-27 ENCOUNTER — Ambulatory Visit: Payer: Medicare Other | Admitting: Podiatry

## 2014-06-02 DIAGNOSIS — L89892 Pressure ulcer of other site, stage 2: Secondary | ICD-10-CM | POA: Diagnosis not present

## 2014-06-08 ENCOUNTER — Other Ambulatory Visit: Payer: Self-pay

## 2014-06-08 ENCOUNTER — Telehealth: Payer: Self-pay | Admitting: Vascular Surgery

## 2014-06-08 NOTE — Telephone Encounter (Addendum)
-----   Message from Denman George, RN sent at 06/08/2014 12:04 PM EDT ----- Regarding: cardiology appt  Contact: [phone] This pt. has a cardiology appt. 4/20 with Dr. Marlou Porch for cardiac clearance prior to surgery.  She is requesting to move the appt. to an earlier date, in case any cardiology testing needs to be done.  Her right AKA has been scheduled for 07/06/14.  Please see what you can work out. Thanks.   notified patient that I was able to move her appt. to 06-11-14 at 8:45am with dr. Marlou Porch

## 2014-06-09 ENCOUNTER — Telehealth: Payer: Self-pay | Admitting: Hematology & Oncology

## 2014-06-09 DIAGNOSIS — L89892 Pressure ulcer of other site, stage 2: Secondary | ICD-10-CM | POA: Diagnosis not present

## 2014-06-09 NOTE — Telephone Encounter (Signed)
Pt called cx 4-28 said was having leg cut off on 4-26. I left RN message to let MD know in case he wants to see her before then

## 2014-06-11 ENCOUNTER — Ambulatory Visit (INDEPENDENT_AMBULATORY_CARE_PROVIDER_SITE_OTHER): Payer: Medicare Other | Admitting: Cardiology

## 2014-06-11 ENCOUNTER — Encounter: Payer: Self-pay | Admitting: Cardiology

## 2014-06-11 VITALS — BP 128/52 | HR 69 | Ht 61.0 in

## 2014-06-11 DIAGNOSIS — Z01810 Encounter for preprocedural cardiovascular examination: Secondary | ICD-10-CM

## 2014-06-11 DIAGNOSIS — I059 Rheumatic mitral valve disease, unspecified: Secondary | ICD-10-CM | POA: Diagnosis not present

## 2014-06-11 DIAGNOSIS — I70209 Unspecified atherosclerosis of native arteries of extremities, unspecified extremity: Secondary | ICD-10-CM

## 2014-06-11 DIAGNOSIS — L98499 Non-pressure chronic ulcer of skin of other sites with unspecified severity: Secondary | ICD-10-CM | POA: Diagnosis not present

## 2014-06-11 DIAGNOSIS — I739 Peripheral vascular disease, unspecified: Secondary | ICD-10-CM

## 2014-06-11 NOTE — Patient Instructions (Addendum)
The current medical regimen is effective;  continue present plan and medications.  Your physician has requested that you have an echocardiogram. Echocardiography is a painless test that uses sound waves to create images of your heart. It provides your doctor with information about the size and shape of your heart and how well your heart's chambers and valves are working. This procedure takes approximately one hour. There are no restrictions for this procedure.  Follow up in 6 months with Dr. Marlou Porch.  You will receive a letter in the mail 2 months before you are due.  Please call us when you receive this letter to schedule your follow up appointment.  Thank you for choosing [facility]!!

## 2014-06-11 NOTE — Progress Notes (Signed)
[facility]. [address] [place], [place]  [address] Phone: [phone] Fax:  [phone]  Date:  06/11/2014   ID:  Sandra, [name] 1931/02/17, MRN [id number]  PCP:   Melinda Crutch, MD   History of Present Illness: Sandra [name] is a 81 y.o. female with diabetes, morbid obesity, chronic kidney disease, hypothyroidism, moderate mitral annular calcification seen on transesophageal echocardiogram in September of 2011 with mild mitral regurgitation here for followup. She's experienced no increase in shortness of breath, no chest pain. Occasionally she will feel some twinges of left breast pain but is very atypical. She is not very active, severely deconditioned, mostly bound to a wheelchair.  Unfortunately, she has battled lower extremity ulcers/wounds for quite some time and it is been recommended that she have an above knee amputation and she is currently scheduled for this. She is here today for preoperative cardiovascular risk assessment. In review of her records, in October of 2002 she underwent a cardiac catheterization by Dr. Leonia Reeves which showed no significant coronary artery disease. Her ejection fraction was normal. She did have quite significant mitral annular calcification.  She remembers being told recently that she had a "large heart "perhaps this was from a chest x-ray. I think it would be beneficial for [place] to evaluate this prior to her upcoming procedure. Echocardiogram.  She is quite depressed about her upcoming procedure. Dr. Scot Dock will be performing and has laid out her risks/benefits clearly at his last office visit, underlying the possibility of morbidity/mortality surrounding this procedure. I explained to her that she is at increased risk for myocardial infarction surrounding general anesthesia.   She had significant disease of her right internal carotid artery. Dr. Scot Dock.        Wt Readings from Last 3 Encounters:  05/19/14 207 lb (93.895 kg)  01/14/14 194  lb 7.1 oz (88.2 kg)  01/14/14 194 lb (87.998 kg)     Past Medical History  Diagnosis Date  . Anemia   . Diabetes mellitus   . CAD (coronary artery disease)   . Peripheral vascular disease   . Hypertension   . Cancer     colon; s/p colectomy 2007  . Anemia, iron deficiency 01/17/2012  . Hemorrhoids   . Nosebleed   . Stroke   . Cellulitis 03/08/12    Past Surgical History  Procedure Laterality Date  . Appendectomy    . Cholecystectomy    . Tonsilectomy, adenoidectomy, bilateral myringotomy and tubes    . Colon surgery      for colon CA  . Lower extremity angiogram  September 01, 2012  . Aortogram  September 01, 2012  . Abdominal aortagram N/A 09/01/2012    Procedure: ABDOMINAL Maxcine Ham;  Surgeon: Angelia Mould, MD;  Location: [facility] CATH LAB;  Service: Cardiovascular;  Laterality: N/A;  . Lower extremity angiogram Bilateral 09/01/2012    Procedure: LOWER EXTREMITY ANGIOGRAM;  Surgeon: Angelia Mould, MD;  Location: [facility] CATH LAB;  Service: Cardiovascular;  Laterality: Bilateral;    Current Outpatient Prescriptions  Medication Sig Dispense Refill  . amLODipine (NORVASC) 5 MG tablet Take 5 mg by mouth daily.    Marland Kitchen aspirin EC 81 MG tablet Take 81 mg by mouth every evening.    . BD PEN NEEDLE NANO U/F 32G X 4 MM MISC     . cholecalciferol (VITAMIN D) 1000 UNITS tablet Take 1,000 Units by mouth daily.    . ciprofloxacin (CIPRO) 500 MG tablet Take  500 mg by mouth 2 (two) times daily.    Marland Kitchen doxycycline (VIBRA-TABS) 100 MG tablet Take 1 tablet (100 mg total) by mouth 2 (two) times daily. 14 tablet 0  . FLUoxetine (PROZAC) 20 MG tablet Take 20 mg by mouth daily.    . furosemide (LASIX) 20 MG tablet Take 40 mg by mouth daily.     . Insulin Glargine (LANTUS SOLOSTAR) 100 UNIT/ML Solostar Pen Inject 40 Units into the skin daily at 10 pm. 40 units    . levothyroxine (SYNTHROID, LEVOTHROID) 125 MCG tablet Take 125 mcg by mouth. Synthroid ONlY    . losartan (COZAAR) 100 MG tablet Take 100 mg  by mouth daily.    Marland Kitchen PRODIGY NO CODING BLOOD GLUC test strip      No current facility-administered medications for this visit.    Allergies:    Allergies  Allergen Reactions  . Blackberry [Rubus Fruticosus]     Can't take because of Cancer  . Meloxicam Shortness Of Breath  . Procaine Hcl Swelling  . Alendronate Sodium   . Meloxicam     Social History:  The patient  reports that she quit smoking about 47 years ago. Her smoking use included Cigarettes. She started smoking about 59 years ago. She has a 13 pack-year smoking history. She has never used smokeless tobacco. She reports that she does not drink alcohol or use illicit drugs.   Family History  Problem Relation Age of Onset  . Cancer Mother   . Diabetes Mother   . Other Father     bleeding problems  . Diabetes Sister   . Heart disease Daughter     before age 74  . Cancer Son     ROS:  Please see the history of present illness.   Denies any syncope, bleeding, orthopnea, PND   All other systems reviewed and negative.   PHYSICAL EXAM: VS:  BP 128/52 mmHg  Pulse 69  Ht 5\' 1"  (1.549 m)  SpO2 94% Well nourished, well developed, in no acute distress, sitting in wheelchair, unable to stand well HEENT: normal, [place]/AT, EOMI Neck: no JVD, normal carotid upstroke, no bruit Cardiac:  normal S1, S2; RRR; no murmur Lungs:  clear to auscultation bilaterally, no wheezing, rhonchi or rales Abd: soft, nontender, no hepatomegaly, no bruitsObese Ext: Weeping lower extremities right greater than left. Open sore on right. Wounds are currently wrapped. Skin: warm and dry GU: deferred Neuro: no focal abnormalities noted, AAO x 3, weakness.  EKG:  07/30/13-sinus rhythm, 79, right bundle branch block, left anterior fascicular block, bifascicular block, first degree AV block, PR interval 304 ms   ASSESSMENT AND PLAN:  1. Preoperative risk assessment-she is at least moderate risk from a cardiovascular perspective for upcoming surgery. She  has not had any increasing angina, no adverse arrhythmias, no evidence of acute heart failure, no severe valvular abnormalities. Unfortunately, she is not able to complete greater than 4 metabolic equivalents of activity. Prior cardiovascular evaluation included cardiac catheterization which showed no significant coronary artery disease. Given her current weight, I do not think that a nuclear stress test would be of much benefit given the reduced sensitivity specificity. She is not complaining of any anginal type symptoms. She has not had any increase in shortness of breath. I will however check an echocardiogram to ensure that she is maintaining proper ejection fraction. If this were to be severely reduced, we would then discuss the implications of this. Regardless, she understands after discussing with me that  she is at increased risk for cardiovascular mortality surrounding this procedure. 2. Peripheral vascular disease-quite significant. Right lower extremity wounds. Extensive wound care. Dr. Scot Dock. AKA scheduled. 3. Mitral valve disorder-prior mitral annular calcification, I will check echocardiogram 4. Hypertension-well-controlled. 5. Morbid obesity-encourage weight loss. Contributing to her edema. Challenging for her to lose weight given her current deconditioned condition  Signed, Candee Furbish, MD [facility]  06/11/2014 9:35 AM

## 2014-06-15 ENCOUNTER — Ambulatory Visit (HOSPITAL_COMMUNITY): Payer: Medicare Other | Attending: Cardiology | Admitting: Cardiology

## 2014-06-15 DIAGNOSIS — I059 Rheumatic mitral valve disease, unspecified: Secondary | ICD-10-CM | POA: Insufficient documentation

## 2014-06-15 DIAGNOSIS — I272 Other secondary pulmonary hypertension: Secondary | ICD-10-CM | POA: Insufficient documentation

## 2014-06-15 NOTE — Progress Notes (Signed)
Echo performed. 

## 2014-06-23 ENCOUNTER — Encounter (HOSPITAL_BASED_OUTPATIENT_CLINIC_OR_DEPARTMENT_OTHER): Payer: Medicare Other | Attending: Surgery

## 2014-06-28 ENCOUNTER — Telehealth: Payer: Self-pay | Admitting: Cardiology

## 2014-06-28 NOTE — Telephone Encounter (Signed)
Reviewed results with patient who states understanding.  She is aware she has been cleared for surgery.

## 2014-06-28 NOTE — Telephone Encounter (Signed)
New message ° ° ° ° ° °Want echo results °

## 2014-06-30 ENCOUNTER — Ambulatory Visit: Payer: Medicare Other | Admitting: Cardiology

## 2014-07-02 ENCOUNTER — Encounter (HOSPITAL_COMMUNITY)
Admission: RE | Admit: 2014-07-02 | Discharge: 2014-07-02 | Disposition: A | Discharge status: Home / Self Care | Payer: Medicare Other | Source: Ambulatory Visit | Attending: Vascular Surgery | Admitting: Vascular Surgery

## 2014-07-02 ENCOUNTER — Encounter (HOSPITAL_COMMUNITY): Payer: Self-pay

## 2014-07-02 HISTORY — DX: Hypothyroidism, unspecified: E03.9

## 2014-07-02 HISTORY — DX: Reserved for inherently not codable concepts without codable children: IMO0001

## 2014-07-02 HISTORY — DX: Local infection of the skin and subcutaneous tissue, unspecified: L08.9

## 2014-07-02 HISTORY — DX: Non-pressure chronic ulcer of other part of unspecified foot with unspecified severity: L97.509

## 2014-07-02 HISTORY — DX: Bifascicular block: I45.2

## 2014-07-02 HISTORY — DX: Type 2 diabetes mellitus with foot ulcer: E11.621

## 2014-07-02 HISTORY — DX: Chronic kidney disease, unspecified: N18.9

## 2014-07-02 HISTORY — DX: Depression, unspecified: F32.A

## 2014-07-02 HISTORY — DX: Other injury of unspecified body region, initial encounter: T14.8XXA

## 2014-07-02 HISTORY — DX: Localized edema: R60.0

## 2014-07-02 HISTORY — DX: Major depressive disorder, single episode, unspecified: F32.9

## 2014-07-02 LAB — BASIC METABOLIC PANEL
Anion gap: 11 (ref 5–15)
BUN: 51 mg/dL — AB (ref 6–23)
CALCIUM: 8.6 mg/dL (ref 8.4–10.5)
CO2: 19 mmol/L (ref 19–32)
Chloride: 109 mmol/L (ref 96–112)
Creatinine, Ser: 1.67 mg/dL — ABNORMAL HIGH (ref 0.50–1.10)
GFR calc Af Amer: 32 mL/min — ABNORMAL LOW (ref >90)
GFR, EST NON AFRICAN AMERICAN: 27 mL/min — AB (ref >90)
GLUCOSE: 88 mg/dL (ref 70–99)
Potassium: 5.4 mmol/L — ABNORMAL HIGH (ref 3.5–5.1)
Sodium: 139 mmol/L (ref 135–145)

## 2014-07-02 LAB — CBC
HCT: 33 % — ABNORMAL LOW (ref 36.0–46.0)
Hemoglobin: 10.3 g/dL — ABNORMAL LOW (ref 12.0–15.0)
MCH: 28.5 pg (ref 26.0–34.0)
MCHC: 31.2 g/dL (ref 30.0–36.0)
MCV: 91.2 fL (ref 78.0–100.0)
Platelets: 163 10*3/uL (ref 150–400)
RBC: 3.62 MIL/uL — ABNORMAL LOW (ref 3.87–5.11)
RDW: 15.4 % (ref 11.5–15.5)
WBC: 7.3 10*3/uL (ref 4.0–10.5)

## 2014-07-02 LAB — PROTIME-INR
INR: 1.17 (ref 0.00–1.49)
Prothrombin Time: 15 seconds (ref 11.6–15.2)

## 2014-07-02 LAB — SURGICAL PCR SCREEN
MRSA, PCR: POSITIVE — AB
STAPHYLOCOCCUS AUREUS: POSITIVE — AB

## 2014-07-02 LAB — APTT: APTT: 34 s (ref 24–37)

## 2014-07-02 NOTE — Progress Notes (Signed)
   07/02/14 1512  OBSTRUCTIVE SLEEP APNEA  Have you ever been diagnosed with sleep apnea through a sleep study? No  Do you snore loudly (loud enough to be heard through closed doors)?  0  Do you often feel tired, fatigued, or sleepy during the daytime? 0  Has anyone observed you stop breathing during your sleep? 0  Do you have, or are you being treated for high blood pressure? 1  BMI more than 35 kg/m2? 1  Age over 79 years old? 1  Neck circumference greater than 40 cm/16 inches? 0  Gender: 0

## 2014-07-02 NOTE — Progress Notes (Addendum)
I called a prescription for Mupirocin ointment to [facility], [place], [place].  Next delivery is Monday after 1pm.  I informed patient of this and she does not have anyone to pick medication up for her.  I instructed patient to bring prescription to the hospital with her.

## 2014-07-02 NOTE — Pre-Procedure Instructions (Signed)
Sandra [name]  07/02/2014   Your procedure is scheduled on:  Tuesday, April 26  Report to [facility] Admitting at 0830 AM.  Call this number if you have problems the morning of surgery: [phone]   Remember:   Do not eat food or drink liquids after midnight. Monday night   Take these medicines the morning of surgery with A SIP OF WATER: amlodipine (Norvasc) , Cipro, Synthroid    Do not wear jewelry, make-up or nail polish.  Do not wear lotions, powders, or perfumes. You may wear deodorant.  Do not shave 48 hours prior to surgery. Men may shave face and neck.  Do not bring valuables to the hospital.  [facility] is not responsible                  for any belongings or valuables.               Contacts, dentures or bridgework may not be worn into surgery.  Leave suitcase in the car. After surgery it may be brought to your room.  For patients admitted to the hospital, discharge time is determined by your                treatment team.      Special Instructions: [place] - Preparing for Surgery  Before surgery, you can play an important role.  Because skin is not sterile, your skin needs to be as free of germs as possible.  You can reduce the number of germs on you skin by washing with CHG (chlorahexidine gluconate) soap before surgery.  CHG is an antiseptic cleaner which kills germs and bonds with the skin to continue killing germs even after washing.  Please DO NOT use if you have an allergy to CHG or antibacterial soaps.  If your skin becomes reddened/irritated stop using the CHG and inform your nurse when you arrive at Short Stay.  Do not shave (including legs and underarms) for at least 48 hours prior to the first CHG shower.  You may shave your face.  Please follow these instructions carefully:   1.  Shower with CHG Soap the night before surgery and the   morning of Surgery.  2.  If you choose to wash your hair, wash your hair first as usual with your    normal  shampoo.  3.  After you shampoo, rinse your hair and body thoroughly to remove the      Shampoo.  4.  Use CHG as you would any other liquid soap.  You can apply chg directly   to the skin and wash gently with scrungie or a clean washcloth.  5.  Apply the CHG Soap to your body ONLY FROM THE NECK DOWN.     Do not use on open wounds or open sores.  Avoid contact with your eyes,   ears, mouth and genitals (private parts).  Wash genitals (private parts)   with your normal soap.  6.  Wash thoroughly, paying special attention to the area where your surgery will be performed.  7.  Thoroughly rinse your body with warm water from the neck down.  8.  DO NOT shower/wash with your normal soap after using and rinsing off   the CHG Soap.  9.  Pat yourself dry with a clean towel.            10.  Wear clean pajamas.  11.  Place clean sheets on your bed the night of your first shower and do not   sleep with pets.  Day of Surgery  Do not apply any lotions/deoderants the morning of surgery.  Please wear clean clothes to the hospital/surgery center.     Please read over the following fact sheets that you were given: Pain Booklet, Coughing and Deep Breathing and Surgical Site Infection Prevention

## 2014-07-05 MED ORDER — CHLORHEXIDINE GLUCONATE 4 % EX LIQD
60.0000 mL | Freq: Once | CUTANEOUS | Status: DC
  Filled 2014-07-05: qty 60

## 2014-07-05 MED ORDER — SODIUM CHLORIDE 0.9 % IV SOLN: INTRAVENOUS | Status: DC

## 2014-07-05 MED ORDER — CEFUROXIME SODIUM 1.5 G IJ SOLR
1.5000 g | INTRAMUSCULAR | Status: AC
  Administered 2014-07-06: 1.5 g via INTRAVENOUS
  Filled 2014-07-05: qty 1.5

## 2014-07-05 NOTE — Progress Notes (Signed)
Anesthesia Chart Review:  Patient is a 81 year old female scheduled for right AKA on 07/06/14 by Dr. Scot Dock.  History includes former smoker, CAD (by notes, no significant CAD by 12/2000 cath), bifascicular block, CVA, right carotid stenosis, PVD, HTN, anemia, DM2 on insulin, hypothyroidism, depression, CKD, LE edema, stage IIIB colon cancer s/p partial colectomy '07, cholecystectomy, T&A. BMI is consistent with morbid obesity.  PCP is Dr. Tobi Bastos, last visit 07/01/14.  He felt she was "OK for ASA surgery" but changed her Lasix to torosemide in hopes to get additional fluid off before surgery.  (Her medications are delivered only once a day, so she could not start this until 07/02/14.)  Cardiologist is Dr. Marlou Porch, last visit 06/11/14.  He cleared her for surgery from a cardiac standpoint but with increased (at least moderate) risk for CV complication.  01/07/14 EKG: RBBB, LAFB. Difficult to determine underlying rhythm--I don't see consistent P waves.  Doesn't appear irregular enough to be afib. Could be junctional rhythm. In a few leads, could also look like SR with first degree AVB.  P-waves were also difficult to see on her 07/30/13 EKG which was interpreted as SR with first degree AVB. Will recheck an EKG on arrival.   06/15/14 Echo: - Left ventricle: Wall thickness was increased in a pattern of moderate LVH. Systolic function was normal. The estimated ejection fraction was in the range of 55% to 60%. Doppler parameters are consistent with elevated ventricular end-diastolic filling pressure. - Mitral valve: Severely calcified annulus. There was mild tomoderate regurgitation. - Left atrium: The atrium was moderately dilated. - Right atrium: The atrium was mildly dilated. - Tricuspid valve: There was moderate regurgitation. - Pulmonary arteries: PA peak pressure: 75 mm Hg (S). - Pericardium, extracardiac: A trivial pericardial effusion was identified.  11/26/12 carotid duplex: 70-78% RICA, < 67% LICA  stenosis, BECA stenoses.   Preoperative labs noted. Cr 1.67, previously  ~ 1.4 - 1.6 since 12/2012.  K 5.4. H/H 10.3/33.0, stable since 01/2014.  She has been cleared by her PCP and cardiologist.  She started a new diuretic on Friday.  Further evaluation by her surgeon and anesthesiologist on the day of surgery to ensure no acute changes.   [facility] Short Stay Center/Anesthesiology Phone [phone] 07/05/2014 9:52 AM

## 2014-07-06 ENCOUNTER — Encounter (HOSPITAL_COMMUNITY): Payer: Self-pay | Admitting: *Deleted

## 2014-07-06 ENCOUNTER — Other Ambulatory Visit: Payer: Self-pay

## 2014-07-06 ENCOUNTER — Inpatient Hospital Stay (HOSPITAL_COMMUNITY): Payer: Medicare Other | Admitting: Vascular Surgery

## 2014-07-06 ENCOUNTER — Inpatient Hospital Stay (HOSPITAL_COMMUNITY): Payer: Medicare Other | Admitting: Critical Care Medicine

## 2014-07-06 ENCOUNTER — Inpatient Hospital Stay (HOSPITAL_COMMUNITY)
Admission: RE | Admit: 2014-07-06 | Discharge: 2014-07-14 | DRG: 240 | Disposition: A | Discharge status: Skilled Nursing Facility | Payer: Medicare Other | Source: Ambulatory Visit | Attending: Vascular Surgery | Admitting: Vascular Surgery

## 2014-07-06 ENCOUNTER — Encounter (HOSPITAL_COMMUNITY)
Admission: RE | Disposition: A | Discharge status: Skilled Nursing Facility | Payer: Self-pay | Source: Ambulatory Visit | Attending: Vascular Surgery

## 2014-07-06 DIAGNOSIS — L304 Erythema intertrigo: Secondary | ICD-10-CM | POA: Diagnosis present

## 2014-07-06 DIAGNOSIS — Z833 Family history of diabetes mellitus: Secondary | ICD-10-CM

## 2014-07-06 DIAGNOSIS — E8809 Other disorders of plasma-protein metabolism, not elsewhere classified: Secondary | ICD-10-CM | POA: Diagnosis present

## 2014-07-06 DIAGNOSIS — K5900 Constipation, unspecified: Secondary | ICD-10-CM | POA: Diagnosis not present

## 2014-07-06 DIAGNOSIS — Z79899 Other long term (current) drug therapy: Secondary | ICD-10-CM

## 2014-07-06 DIAGNOSIS — I739 Peripheral vascular disease, unspecified: Secondary | ICD-10-CM | POA: Diagnosis present

## 2014-07-06 DIAGNOSIS — E1122 Type 2 diabetes mellitus with diabetic chronic kidney disease: Secondary | ICD-10-CM | POA: Diagnosis present

## 2014-07-06 DIAGNOSIS — I2510 Atherosclerotic heart disease of native coronary artery without angina pectoris: Secondary | ICD-10-CM | POA: Diagnosis present

## 2014-07-06 DIAGNOSIS — D509 Iron deficiency anemia, unspecified: Secondary | ICD-10-CM

## 2014-07-06 DIAGNOSIS — Z419 Encounter for procedure for purposes other than remedying health state, unspecified: Secondary | ICD-10-CM

## 2014-07-06 DIAGNOSIS — E875 Hyperkalemia: Secondary | ICD-10-CM | POA: Diagnosis present

## 2014-07-06 DIAGNOSIS — I9581 Postprocedural hypotension: Secondary | ICD-10-CM | POA: Diagnosis not present

## 2014-07-06 DIAGNOSIS — Z22322 Carrier or suspected carrier of Methicillin resistant Staphylococcus aureus: Secondary | ICD-10-CM

## 2014-07-06 DIAGNOSIS — Z8673 Personal history of transient ischemic attack (TIA), and cerebral infarction without residual deficits: Secondary | ICD-10-CM

## 2014-07-06 DIAGNOSIS — L03115 Cellulitis of right lower limb: Secondary | ICD-10-CM | POA: Diagnosis present

## 2014-07-06 DIAGNOSIS — Z6839 Body mass index (BMI) 39.0-39.9, adult: Secondary | ICD-10-CM

## 2014-07-06 DIAGNOSIS — K5909 Other constipation: Secondary | ICD-10-CM | POA: Diagnosis not present

## 2014-07-06 DIAGNOSIS — Z7982 Long term (current) use of aspirin: Secondary | ICD-10-CM | POA: Diagnosis not present

## 2014-07-06 DIAGNOSIS — Z87891 Personal history of nicotine dependence: Secondary | ICD-10-CM

## 2014-07-06 DIAGNOSIS — M25512 Pain in left shoulder: Secondary | ICD-10-CM | POA: Diagnosis present

## 2014-07-06 DIAGNOSIS — Z85038 Personal history of other malignant neoplasm of large intestine: Secondary | ICD-10-CM | POA: Diagnosis not present

## 2014-07-06 DIAGNOSIS — I5189 Other ill-defined heart diseases: Secondary | ICD-10-CM | POA: Diagnosis present

## 2014-07-06 DIAGNOSIS — E1129 Type 2 diabetes mellitus with other diabetic kidney complication: Secondary | ICD-10-CM

## 2014-07-06 DIAGNOSIS — E1121 Type 2 diabetes mellitus with diabetic nephropathy: Secondary | ICD-10-CM | POA: Diagnosis not present

## 2014-07-06 DIAGNOSIS — E038 Other specified hypothyroidism: Secondary | ICD-10-CM | POA: Diagnosis not present

## 2014-07-06 DIAGNOSIS — I509 Heart failure, unspecified: Secondary | ICD-10-CM

## 2014-07-06 DIAGNOSIS — I10 Essential (primary) hypertension: Secondary | ICD-10-CM | POA: Diagnosis present

## 2014-07-06 DIAGNOSIS — N183 Chronic kidney disease, stage 3 (moderate): Secondary | ICD-10-CM | POA: Diagnosis present

## 2014-07-06 DIAGNOSIS — I70245 Atherosclerosis of native arteries of left leg with ulceration of other part of foot: Secondary | ICD-10-CM | POA: Diagnosis not present

## 2014-07-06 DIAGNOSIS — I129 Hypertensive chronic kidney disease with stage 1 through stage 4 chronic kidney disease, or unspecified chronic kidney disease: Secondary | ICD-10-CM | POA: Diagnosis present

## 2014-07-06 DIAGNOSIS — N189 Chronic kidney disease, unspecified: Secondary | ICD-10-CM | POA: Diagnosis not present

## 2014-07-06 DIAGNOSIS — E039 Hypothyroidism, unspecified: Secondary | ICD-10-CM | POA: Diagnosis present

## 2014-07-06 DIAGNOSIS — E559 Vitamin D deficiency, unspecified: Secondary | ICD-10-CM | POA: Diagnosis present

## 2014-07-06 DIAGNOSIS — N179 Acute kidney failure, unspecified: Secondary | ICD-10-CM | POA: Diagnosis present

## 2014-07-06 DIAGNOSIS — I70235 Atherosclerosis of native arteries of right leg with ulceration of other part of foot: Secondary | ICD-10-CM | POA: Diagnosis not present

## 2014-07-06 DIAGNOSIS — S81809A Unspecified open wound, unspecified lower leg, initial encounter: Secondary | ICD-10-CM | POA: Diagnosis present

## 2014-07-06 DIAGNOSIS — L97519 Non-pressure chronic ulcer of other part of right foot with unspecified severity: Secondary | ICD-10-CM | POA: Diagnosis present

## 2014-07-06 HISTORY — PX: AMPUTATION: SHX166

## 2014-07-06 LAB — GLUCOSE, CAPILLARY
GLUCOSE-CAPILLARY: 116 mg/dL — AB (ref 70–99)
GLUCOSE-CAPILLARY: 137 mg/dL — AB (ref 70–99)
Glucose-Capillary: 94 mg/dL (ref 70–99)
Glucose-Capillary: 92 mg/dL (ref 70–99)
Glucose-Capillary: 96 mg/dL (ref 70–99)
Glucose-Capillary: 139 mg/dL — ABNORMAL HIGH (ref 70–99)

## 2014-07-06 LAB — BASIC METABOLIC PANEL
ANION GAP: 10 (ref 5–15)
BUN: 47 mg/dL — ABNORMAL HIGH (ref 6–23)
CHLORIDE: 107 mmol/L (ref 96–112)
CO2: 20 mmol/L (ref 19–32)
Calcium: 8.3 mg/dL — ABNORMAL LOW (ref 8.4–10.5)
Creatinine, Ser: 1.93 mg/dL — ABNORMAL HIGH (ref 0.50–1.10)
GFR calc non Af Amer: 23 mL/min — ABNORMAL LOW (ref >90)
GFR, EST AFRICAN AMERICAN: 27 mL/min — AB (ref >90)
Glucose, Bld: 107 mg/dL — ABNORMAL HIGH (ref 70–99)
POTASSIUM: 5.1 mmol/L (ref 3.5–5.1)
Sodium: 137 mmol/L (ref 135–145)

## 2014-07-06 LAB — CBC
HCT: 28.9 % — ABNORMAL LOW (ref 36.0–46.0)
Hemoglobin: 9.2 g/dL — ABNORMAL LOW (ref 12.0–15.0)
MCH: 28.6 pg (ref 26.0–34.0)
MCHC: 31.8 g/dL (ref 30.0–36.0)
MCV: 89.8 fL (ref 78.0–100.0)
Platelets: 156 10*3/uL (ref 150–400)
RBC: 3.22 MIL/uL — ABNORMAL LOW (ref 3.87–5.11)
RDW: 15.5 % (ref 11.5–15.5)
WBC: 7.7 10*3/uL (ref 4.0–10.5)

## 2014-07-06 LAB — BRAIN NATRIURETIC PEPTIDE: B NATRIURETIC PEPTIDE 5: 254.3 pg/mL — AB (ref 0.0–100.0)

## 2014-07-06 SURGERY — AMPUTATION, ABOVE KNEE
Anesthesia: General | Site: Leg Upper | Laterality: Right

## 2014-07-06 MED ORDER — LABETALOL HCL 5 MG/ML IV SOLN
10.0000 mg | INTRAVENOUS | Status: DC | PRN
Start: 2014-07-06 — End: 2014-07-14
  Filled 2014-07-06: qty 4

## 2014-07-06 MED ORDER — SODIUM CHLORIDE 0.45 % IV SOLN: INTRAVENOUS | Status: DC

## 2014-07-06 MED ORDER — HYDRALAZINE HCL 20 MG/ML IJ SOLN: 5.0000 mg | INTRAMUSCULAR | Status: DC | PRN

## 2014-07-06 MED ORDER — ACETAMINOPHEN 325 MG PO TABS: 325.0000 mg | ORAL_TABLET | ORAL | Status: DC | PRN

## 2014-07-06 MED ORDER — HEPARIN SODIUM (PORCINE) 1000 UNIT/ML IJ SOLN
INTRAMUSCULAR | Status: AC
  Filled 2014-07-06: qty 1

## 2014-07-06 MED ORDER — SODIUM CHLORIDE 0.9 % IJ SOLN
3.0000 mL | Freq: Two times a day (BID) | INTRAMUSCULAR | Status: DC
  Administered 2014-07-06 – 2014-07-14 (×13): 3 mL via INTRAVENOUS

## 2014-07-06 MED ORDER — INSULIN GLARGINE 100 UNIT/ML SOLOSTAR PEN: 40.0000 [IU] | PEN_INJECTOR | Freq: Every day | SUBCUTANEOUS | Status: DC

## 2014-07-06 MED ORDER — NYSTATIN 100000 UNIT/GM EX CREA
TOPICAL_CREAM | Freq: Two times a day (BID) | CUTANEOUS | Status: DC
  Filled 2014-07-06: qty 15

## 2014-07-06 MED ORDER — INSULIN GLARGINE 100 UNIT/ML ~~LOC~~ SOLN: 35.0000 [IU] | Freq: Every day | SUBCUTANEOUS | Status: DC

## 2014-07-06 MED ORDER — VITAMIN D (ERGOCALCIFEROL) 1.25 MG (50000 UNIT) PO CAPS
50000.0000 [IU] | ORAL_CAPSULE | Freq: Every day | ORAL | Status: DC
  Administered 2014-07-06 – 2014-07-12 (×7): 50000 [IU] via ORAL
  Filled 2014-07-06 (×8): qty 1

## 2014-07-06 MED ORDER — PANTOPRAZOLE SODIUM 40 MG PO TBEC
40.0000 mg | DELAYED_RELEASE_TABLET | Freq: Every day | ORAL | Status: DC
  Administered 2014-07-06 – 2014-07-14 (×9): 40 mg via ORAL
  Filled 2014-07-06 (×7): qty 1

## 2014-07-06 MED ORDER — LACTATED RINGERS IV SOLN
Freq: Once | INTRAVENOUS | Status: AC
  Administered 2014-07-06: 10:00:00 via INTRAVENOUS

## 2014-07-06 MED ORDER — PHENOL 1.4 % MT LIQD
1.0000 | OROMUCOSAL | Status: DC | PRN
  Filled 2014-07-06: qty 177

## 2014-07-06 MED ORDER — ACETAMINOPHEN 650 MG RE SUPP
325.0000 mg | RECTAL | Status: DC | PRN
Start: 2014-07-06 — End: 2014-07-14

## 2014-07-06 MED ORDER — LEVOTHYROXINE SODIUM 125 MCG PO TABS
125.0000 ug | ORAL_TABLET | Freq: Every day | ORAL | Status: DC
  Administered 2014-07-07 – 2014-07-14 (×8): 125 ug via ORAL
  Filled 2014-07-06 (×10): qty 1

## 2014-07-06 MED ORDER — PROPOFOL 10 MG/ML IV BOLUS
INTRAVENOUS | Status: AC
  Filled 2014-07-06: qty 20

## 2014-07-06 MED ORDER — DOCUSATE SODIUM 100 MG PO CAPS
100.0000 mg | ORAL_CAPSULE | Freq: Every day | ORAL | Status: DC
  Administered 2014-07-07 – 2014-07-10 (×4): 100 mg via ORAL
  Filled 2014-07-06 (×4): qty 1

## 2014-07-06 MED ORDER — GUAIFENESIN-DM 100-10 MG/5ML PO SYRP
15.0000 mL | ORAL_SOLUTION | ORAL | Status: DC | PRN
  Filled 2014-07-06: qty 15

## 2014-07-06 MED ORDER — ENOXAPARIN SODIUM 30 MG/0.3ML ~~LOC~~ SOLN
30.0000 mg | SUBCUTANEOUS | Status: DC
  Administered 2014-07-07 – 2014-07-13 (×7): 30 mg via SUBCUTANEOUS
  Filled 2014-07-06 (×8): qty 0.3

## 2014-07-06 MED ORDER — NYSTATIN 100000 UNIT/GM EX POWD
Freq: Two times a day (BID) | CUTANEOUS | Status: DC
  Administered 2014-07-06 – 2014-07-09 (×7): via TOPICAL
  Administered 2014-07-09: 1 via TOPICAL
  Administered 2014-07-10 – 2014-07-14 (×9): via TOPICAL
  Filled 2014-07-06 (×2): qty 15

## 2014-07-06 MED ORDER — HYDROMORPHONE HCL 1 MG/ML IJ SOLN
INTRAMUSCULAR | Status: AC
  Filled 2014-07-06: qty 1

## 2014-07-06 MED ORDER — NEOSTIGMINE METHYLSULFATE 10 MG/10ML IV SOLN
INTRAVENOUS | Status: AC
  Filled 2014-07-06: qty 1

## 2014-07-06 MED ORDER — ONDANSETRON HCL 4 MG/2ML IJ SOLN
INTRAMUSCULAR | Status: DC | PRN
  Administered 2014-07-06: 4 mg via INTRAVENOUS

## 2014-07-06 MED ORDER — CIPROFLOXACIN HCL 500 MG PO TABS
500.0000 mg | ORAL_TABLET | Freq: Every day | ORAL | Status: DC
  Administered 2014-07-07 – 2014-07-09 (×3): 500 mg via ORAL
  Filled 2014-07-06 (×5): qty 1

## 2014-07-06 MED ORDER — ROCURONIUM BROMIDE 50 MG/5ML IV SOLN
INTRAVENOUS | Status: AC
  Filled 2014-07-06: qty 1

## 2014-07-06 MED ORDER — DEXAMETHASONE SODIUM PHOSPHATE 4 MG/ML IJ SOLN
INTRAMUSCULAR | Status: DC | PRN
  Administered 2014-07-06: 4 mg via INTRAVENOUS

## 2014-07-06 MED ORDER — SODIUM CHLORIDE 0.9 % IJ SOLN: 3.0000 mL | INTRAMUSCULAR | Status: DC | PRN

## 2014-07-06 MED ORDER — FENTANYL CITRATE (PF) 250 MCG/5ML IJ SOLN
INTRAMUSCULAR | Status: AC
  Filled 2014-07-06: qty 5

## 2014-07-06 MED ORDER — HYDROMORPHONE HCL 1 MG/ML IJ SOLN
0.2500 mg | INTRAMUSCULAR | Status: DC | PRN
  Administered 2014-07-06 (×2): 0.25 mg via INTRAVENOUS
  Administered 2014-07-06: 0.5 mg via INTRAVENOUS
  Administered 2014-07-06 (×2): 0.25 mg via INTRAVENOUS
  Administered 2014-07-06: 0.5 mg via INTRAVENOUS

## 2014-07-06 MED ORDER — PROMETHAZINE HCL 25 MG/ML IJ SOLN: 6.2500 mg | INTRAMUSCULAR | Status: DC | PRN

## 2014-07-06 MED ORDER — ASPIRIN EC 81 MG PO TBEC
81.0000 mg | DELAYED_RELEASE_TABLET | Freq: Every evening | ORAL | Status: DC
  Administered 2014-07-06 – 2014-07-13 (×8): 81 mg via ORAL
  Filled 2014-07-06 (×9): qty 1

## 2014-07-06 MED ORDER — SUCCINYLCHOLINE CHLORIDE 20 MG/ML IJ SOLN
INTRAMUSCULAR | Status: DC | PRN
  Administered 2014-07-06: 100 mg via INTRAVENOUS

## 2014-07-06 MED ORDER — PROPOFOL 10 MG/ML IV BOLUS
INTRAVENOUS | Status: DC | PRN
  Administered 2014-07-06: 70 mg via INTRAVENOUS
  Administered 2014-07-06 (×2): 30 mg via INTRAVENOUS

## 2014-07-06 MED ORDER — INSULIN ASPART 100 UNIT/ML ~~LOC~~ SOLN
0.0000 [IU] | SUBCUTANEOUS | Status: DC
  Administered 2014-07-07 – 2014-07-10 (×5): 1 [IU] via SUBCUTANEOUS
  Administered 2014-07-10: 3 [IU] via SUBCUTANEOUS

## 2014-07-06 MED ORDER — SODIUM CHLORIDE 0.9 % IV SOLN: 250.0000 mL | INTRAVENOUS | Status: DC | PRN

## 2014-07-06 MED ORDER — INSULIN GLARGINE 100 UNIT/ML ~~LOC~~ SOLN
40.0000 [IU] | Freq: Every day | SUBCUTANEOUS | Status: DC
  Filled 2014-07-06 (×2): qty 0.4

## 2014-07-06 MED ORDER — OXYCODONE HCL 5 MG PO TABS
5.0000 mg | ORAL_TABLET | Freq: Four times a day (QID) | ORAL | Status: DC | PRN
  Administered 2014-07-06 – 2014-07-07 (×4): 10 mg via ORAL
  Administered 2014-07-08 – 2014-07-10 (×4): 5 mg via ORAL
  Filled 2014-07-06: qty 1
  Filled 2014-07-06 (×2): qty 2
  Filled 2014-07-06: qty 1
  Filled 2014-07-06: qty 2
  Filled 2014-07-06 (×2): qty 1
  Filled 2014-07-06: qty 2
  Filled 2014-07-06 (×2): qty 1

## 2014-07-06 MED ORDER — KETAMINE HCL 100 MG/ML IJ SOLN
INTRAMUSCULAR | Status: AC
  Filled 2014-07-06: qty 1

## 2014-07-06 MED ORDER — TORSEMIDE 20 MG PO TABS
40.0000 mg | ORAL_TABLET | Freq: Two times a day (BID) | ORAL | Status: DC
  Administered 2014-07-07 (×2): 40 mg via ORAL
  Filled 2014-07-06 (×4): qty 2

## 2014-07-06 MED ORDER — SUCCINYLCHOLINE CHLORIDE 20 MG/ML IJ SOLN
INTRAMUSCULAR | Status: AC
  Filled 2014-07-06: qty 1

## 2014-07-06 MED ORDER — SENNOSIDES-DOCUSATE SODIUM 8.6-50 MG PO TABS
1.0000 | ORAL_TABLET | Freq: Every evening | ORAL | Status: DC | PRN
  Filled 2014-07-06: qty 1

## 2014-07-06 MED ORDER — 0.9 % SODIUM CHLORIDE (POUR BTL) OPTIME
TOPICAL | Status: DC | PRN
  Administered 2014-07-06: 1000 mL

## 2014-07-06 MED ORDER — BACITRACIN ZINC 500 UNIT/GM EX OINT
TOPICAL_OINTMENT | CUTANEOUS | Status: AC
  Filled 2014-07-06: qty 28.35

## 2014-07-06 MED ORDER — BISACODYL 10 MG RE SUPP
10.0000 mg | Freq: Every day | RECTAL | Status: DC | PRN
  Administered 2014-07-11: 10 mg via RECTAL
  Filled 2014-07-06 (×3): qty 1

## 2014-07-06 MED ORDER — CIPROFLOXACIN HCL 500 MG PO TABS
500.0000 mg | ORAL_TABLET | Freq: Two times a day (BID) | ORAL | Status: DC
  Filled 2014-07-06 (×2): qty 1

## 2014-07-06 MED ORDER — METOPROLOL TARTRATE 1 MG/ML IV SOLN: 2.0000 mg | INTRAVENOUS | Status: DC | PRN

## 2014-07-06 MED ORDER — FENTANYL CITRATE (PF) 100 MCG/2ML IJ SOLN
INTRAMUSCULAR | Status: DC | PRN
  Administered 2014-07-06 (×3): 25 ug via INTRAVENOUS
  Administered 2014-07-06: 50 ug via INTRAVENOUS
  Administered 2014-07-06: 25 ug via INTRAVENOUS

## 2014-07-06 MED ORDER — FLUOXETINE HCL 20 MG PO CAPS
20.0000 mg | ORAL_CAPSULE | Freq: Every day | ORAL | Status: DC
  Administered 2014-07-07 – 2014-07-14 (×8): 20 mg via ORAL
  Filled 2014-07-06 (×9): qty 1

## 2014-07-06 MED ORDER — MORPHINE SULFATE 2 MG/ML IJ SOLN
2.0000 mg | INTRAMUSCULAR | Status: DC | PRN
  Administered 2014-07-06: 2 mg via INTRAVENOUS
  Filled 2014-07-06: qty 1

## 2014-07-06 MED ORDER — POTASSIUM CHLORIDE CRYS ER 20 MEQ PO TBCR: 20.0000 meq | EXTENDED_RELEASE_TABLET | Freq: Every day | ORAL | Status: DC | PRN

## 2014-07-06 MED ORDER — INSULIN GLARGINE 100 UNIT/ML ~~LOC~~ SOLN
30.0000 [IU] | Freq: Every day | SUBCUTANEOUS | Status: DC
  Administered 2014-07-07 – 2014-07-13 (×7): 30 [IU] via SUBCUTANEOUS
  Filled 2014-07-06 (×9): qty 0.3

## 2014-07-06 MED ORDER — EPHEDRINE SULFATE 50 MG/ML IJ SOLN
INTRAMUSCULAR | Status: DC | PRN
  Administered 2014-07-06 (×4): 5 mg via INTRAVENOUS

## 2014-07-06 MED ORDER — LACTATED RINGERS IV SOLN
INTRAVENOUS | Status: DC | PRN
  Administered 2014-07-06 (×2): via INTRAVENOUS

## 2014-07-06 MED ORDER — INSULIN ASPART 100 UNIT/ML ~~LOC~~ SOLN: 0.0000 [IU] | Freq: Three times a day (TID) | SUBCUTANEOUS | Status: DC

## 2014-07-06 MED ORDER — LIDOCAINE HCL (CARDIAC) 20 MG/ML IV SOLN
INTRAVENOUS | Status: AC
  Filled 2014-07-06: qty 5

## 2014-07-06 MED ORDER — KETAMINE HCL 100 MG/ML IJ SOLN
INTRAMUSCULAR | Status: DC | PRN
  Administered 2014-07-06 (×2): 10 mg via INTRAVENOUS
  Administered 2014-07-06: 20 mg via INTRAVENOUS

## 2014-07-06 MED ORDER — GLYCOPYRROLATE 0.2 MG/ML IJ SOLN
INTRAMUSCULAR | Status: AC
  Filled 2014-07-06: qty 2

## 2014-07-06 MED ORDER — LIDOCAINE HCL (CARDIAC) 20 MG/ML IV SOLN
INTRAVENOUS | Status: DC | PRN
  Administered 2014-07-06: 50 mg via INTRAVENOUS

## 2014-07-06 MED ORDER — ONDANSETRON HCL 4 MG/2ML IJ SOLN
4.0000 mg | Freq: Four times a day (QID) | INTRAMUSCULAR | Status: DC | PRN
  Administered 2014-07-06: 4 mg via INTRAVENOUS

## 2014-07-06 MED ORDER — INSULIN ASPART 100 UNIT/ML ~~LOC~~ SOLN
3.0000 [IU] | Freq: Three times a day (TID) | SUBCUTANEOUS | Status: DC
  Administered 2014-07-07 – 2014-07-14 (×13): 3 [IU] via SUBCUTANEOUS

## 2014-07-06 MED ORDER — PROTAMINE SULFATE 10 MG/ML IV SOLN
INTRAVENOUS | Status: AC
  Filled 2014-07-06: qty 5

## 2014-07-06 SURGICAL SUPPLY — 64 items
BANDAGE ELASTIC 4 VELCRO ST LF (GAUZE/BANDAGES/DRESSINGS) ×6 IMPLANT
BANDAGE ELASTIC 6 VELCRO ST LF (GAUZE/BANDAGES/DRESSINGS) ×6 IMPLANT
BANDAGE ESMARK 6X9 LF (GAUZE/BANDAGES/DRESSINGS) ×1 IMPLANT
BLADE SAW RECIP 87.9 MT (BLADE) ×3 IMPLANT
BNDG COHESIVE 6X5 TAN STRL LF (GAUZE/BANDAGES/DRESSINGS) ×3 IMPLANT
BNDG ESMARK 6X9 LF (GAUZE/BANDAGES/DRESSINGS) ×3
BNDG GAUZE ELAST 4 BULKY (GAUZE/BANDAGES/DRESSINGS) ×6 IMPLANT
CANISTER SUCTION 2500CC (MISCELLANEOUS) ×3 IMPLANT
CANISTER WOUND CARE 500ML ATS (WOUND CARE) ×3 IMPLANT
CLIP TI MEDIUM 6 (CLIP) ×3 IMPLANT
COVER SURGICAL LIGHT HANDLE (MISCELLANEOUS) ×3 IMPLANT
CUFF TOURNIQUET SINGLE 18IN (TOURNIQUET CUFF) IMPLANT
CUFF TOURNIQUET SINGLE 24IN (TOURNIQUET CUFF) IMPLANT
CUFF TOURNIQUET SINGLE 34IN LL (TOURNIQUET CUFF) ×3 IMPLANT
CUFF TOURNIQUET SINGLE 44IN (TOURNIQUET CUFF) IMPLANT
DRAIN CHANNEL 19F RND (DRAIN) IMPLANT
DRAPE ORTHO SPLIT 77X108 STRL (DRAPES) ×4
DRAPE PROXIMA HALF (DRAPES) ×3 IMPLANT
DRAPE SURG ORHT 6 SPLT 77X108 (DRAPES) ×2 IMPLANT
DRAPE U-SHAPE 47X51 STRL (DRAPES) ×3 IMPLANT
DRSG ADAPTIC 3X8 NADH LF (GAUZE/BANDAGES/DRESSINGS) IMPLANT
DRSG MEPITEL 3X4 ME34 (GAUZE/BANDAGES/DRESSINGS) ×6 IMPLANT
DRSG VAC ATS LRG SENSATRAC (GAUZE/BANDAGES/DRESSINGS) ×3 IMPLANT
ELECT REM PT RETURN 9FT ADLT (ELECTROSURGICAL) ×3
ELECTRODE REM PT RTRN 9FT ADLT (ELECTROSURGICAL) ×1 IMPLANT
EVACUATOR SILICONE 100CC (DRAIN) IMPLANT
GAUZE SPONGE 4X4 12PLY STRL (GAUZE/BANDAGES/DRESSINGS) ×3 IMPLANT
GLOVE BIO SURGEON STRL SZ 6.5 (GLOVE) ×4 IMPLANT
GLOVE BIO SURGEON STRL SZ7.5 (GLOVE) ×3 IMPLANT
GLOVE BIO SURGEONS STRL SZ 6.5 (GLOVE) ×2
GLOVE BIOGEL PI IND STRL 6.5 (GLOVE) ×2 IMPLANT
GLOVE BIOGEL PI IND STRL 8 (GLOVE) ×1 IMPLANT
GLOVE BIOGEL PI INDICATOR 6.5 (GLOVE) ×4
GLOVE BIOGEL PI INDICATOR 8 (GLOVE) ×2
GLOVE SURG SS PI 6.5 STRL IVOR (GLOVE) ×3 IMPLANT
GLOVE SURG SS PI 7.0 STRL IVOR (GLOVE) ×6 IMPLANT
GOWN STRL REUS W/ TWL LRG LVL3 (GOWN DISPOSABLE) ×3 IMPLANT
GOWN STRL REUS W/TWL LRG LVL3 (GOWN DISPOSABLE) ×6
GOWN STRL REUS W/TWL XL LVL4 (GOWN DISPOSABLE) ×3 IMPLANT
KIT BASIN OR (CUSTOM PROCEDURE TRAY) ×3 IMPLANT
KIT ROOM TURNOVER OR (KITS) ×3 IMPLANT
NS IRRIG 1000ML POUR BTL (IV SOLUTION) ×3 IMPLANT
PACK GENERAL/GYN (CUSTOM PROCEDURE TRAY) ×3 IMPLANT
PAD ARMBOARD 7.5X6 YLW CONV (MISCELLANEOUS) ×6 IMPLANT
PADDING CAST COTTON 6X4 STRL (CAST SUPPLIES) IMPLANT
SPONGE LAP 18X18 X RAY DECT (DISPOSABLE) ×3 IMPLANT
STAPLER VISISTAT (STAPLE) IMPLANT
STAPLER VISISTAT 35W (STAPLE) ×6 IMPLANT
STOCKINETTE IMPERVIOUS LG (DRAPES) ×3 IMPLANT
SUT BONE WAX W31G (SUTURE) ×3 IMPLANT
SUT ETHILON 3 0 PS 1 (SUTURE) IMPLANT
SUT SILK 0 TIES 10X30 (SUTURE) ×3 IMPLANT
SUT SILK 2 0 (SUTURE) ×2
SUT SILK 2 0 SH CR/8 (SUTURE) ×6 IMPLANT
SUT SILK 2-0 18XBRD TIE 12 (SUTURE) ×1 IMPLANT
SUT SILK 3 0 (SUTURE) ×2
SUT SILK 3-0 18XBRD TIE 12 (SUTURE) ×1 IMPLANT
SUT VIC AB 2-0 CT1 18 (SUTURE) ×3 IMPLANT
SUT VIC AB 2-0 CT1 27 (SUTURE) ×2
SUT VIC AB 2-0 CT1 TAPERPNT 27 (SUTURE) ×1 IMPLANT
TOWEL OR 17X24 6PK STRL BLUE (TOWEL DISPOSABLE) ×3 IMPLANT
TOWEL OR 17X26 10 PK STRL BLUE (TOWEL DISPOSABLE) ×6 IMPLANT
UNDERPAD 30X30 INCONTINENT (UNDERPADS AND DIAPERS) ×3 IMPLANT
WATER STERILE IRR 1000ML POUR (IV SOLUTION) ×3 IMPLANT

## 2014-07-06 NOTE — Consult Note (Addendum)
WOC wound consult note Reason for Consult: Pt is currently in the OR. VVS following for assessment and plan of care to wounds. WOC assistance requested for severe excoriation and yeast rash in pannus folds.  EMR describes skin folds as being red, macerated, and moist with partial thickness skin loss.  Appearance is consistent with intertrigo.  Interdry silver-impregnated fabric ordered for use by bedside nurses and instructions provided.  This product should remain in place for 5 days for optimal plan of care to provide antimicrobial benefits and wick moisture away from skin.  Please re-consult if further assistance is needed.  Thank-you,  Julien Girt MSN, [place], [place], [place], [place]

## 2014-07-06 NOTE — Progress Notes (Signed)
Utilization review completed.  

## 2014-07-06 NOTE — Op Note (Signed)
    NAME: Sandra [name]    MRN: [id number] DOB: 1930/11/15    DATE OF OPERATION: 07/06/2014  PREOP DIAGNOSIS: Nonhealing wounds right foot with infrainguinal arterial occlusive disease  POSTOP DIAGNOSIS: same  PROCEDURE: Right above-the-knee amputation  SURGEON: Judeth Cornfield. Scot Dock, MD, FACS  ASSIST: Leontine Locket, PA  ANESTHESIA: Gen.   EBL: minimal  INDICATIONS: Sandra [name] is a 81 y.o. female presented with nonhealing wounds of the right lower extremity. She was not a candidate for revascularization and presents for primary right above-the-knee amputation.  FINDINGS: significant edema. For this reason an incisional negative pressure dressing was placed. Otherwise, the muscle appeared well-perfused.  TECHNIQUE: The patient was taken to the operating [facility] received a general anesthetic. The right lower extremity was prepped and draped in usual sterile fashion. A tourniquet was placed on the upper thigh. A fishmouth incision was marked above the level of the patella. The leg was exsanguinated with an Esmarch bandage and the tourniquet inflated to 300 mmHg. Under tourniquet control. The incision was carried down through the skin, subcutaneous tissue, fascia and muscle to the femur which was dissected free circumferentially. The periosteum was elevated and the bone divided proximal to the level of skin division. The femoral artery and vein were individually suture ligated with 2-0 silk ties. The saphenous vein was also ligated with a 2-0 silk tie. The tourniquet was then released. Additional hemostasis was obtained using electrocautery and 2-0 silk sutures. The edges of the bone were rasped. It was bleeding from the bone marrow which was significant and therefore a bone wax plug was placed. The wound was irrigated with copious amounts of saline. The fascial layer was closed with interrupted 2-0 Vicryl's. The skin was closed with staples. An incisional negative pressure dressing was placed. The  patient tolerated the procedure well was transferred to the recovery room in stable condition. All needle and sponge counts were correct.  Deitra Mayo, MD, FACS [facility]  DATE OF DICTATION:   07/06/2014

## 2014-07-06 NOTE — Anesthesia Procedure Notes (Signed)
Procedure Name: Intubation Date/Time: 07/06/2014 11:16 AM Performed by: Merrilyn Puma B Pre-anesthesia Checklist: Patient identified, Emergency Drugs available, Suction available and Patient being monitored Patient Re-evaluated:Patient Re-evaluated prior to inductionOxygen Delivery Method: Circle system utilized Preoxygenation: Pre-oxygenation with 100% oxygen Intubation Type: IV induction Ventilation: Mask ventilation without difficulty Laryngoscope Size: Mac and 3 Grade View: Grade I Tube type: Oral Number of attempts: 1 Airway Equipment and Method: Stylet Placement Confirmation: ETT inserted through vocal cords under direct vision,  breath sounds checked- equal and bilateral and positive ETCO2 Secured at: 22 cm Tube secured with: Tape Dental Injury: Teeth and Oropharynx as per pre-operative assessment

## 2014-07-06 NOTE — Progress Notes (Signed)
ANTIBIOTIC CONSULT NOTE - INITIAL  Pharmacy Consult:  Pharmacy to renally adjust antibiotics   Allergies  Allergen Reactions  . Blackberry [Rubus Fruticosus]     Can't take because of Cancer  . Meloxicam Shortness Of Breath  . Procaine Hcl Swelling  . Alendronate Sodium Other (See Comments)    constipation  . Meloxicam     Patient Measurements: Weight: 235 lb (106.595 kg)  Vital Signs: Temp: 97.9 F (36.6 C) (04/26 1505) Temp Source: Oral (04/26 1505) BP: 129/59 mmHg (04/26 1505) Pulse Rate: 69 (04/26 1505) Intake/Output from this shift: Total I/O In: 1200 [I.V.:1200] Out: 200 [Blood:200]  Labs: No results for input(s): WBC, HGB, PLT, LABCREA, CREATININE in the last 72 hours. Estimated Creatinine Clearance: 29.2 mL/min (by C-G formula based on Cr of 1.67). No results for input(s): VANCOTROUGH, VANCOPEAK, VANCORANDOM, GENTTROUGH, GENTPEAK, GENTRANDOM, TOBRATROUGH, TOBRAPEAK, TOBRARND, AMIKACINPEAK, AMIKACINTROU, AMIKACIN in the last 72 hours.   Microbiology: Recent Results (from the past 720 hour(s))  Surgical pcr screen     Status: Abnormal   Collection Time: 07/02/14  3:53 PM  Result Value Ref Range Status   MRSA, PCR POSITIVE (A) NEGATIVE Final   Staphylococcus aureus POSITIVE (A) NEGATIVE Final    Comment:        The Xpert SA Assay (FDA approved for NASAL specimens in patients over 63 years of age), is one component of a comprehensive surveillance program.  Test performance has been validated by [facility] for patients greater than or equal to 108 year old. It is not intended to diagnose infection nor to guide or monitor treatment.     Medical History: Past Medical History  Diagnosis Date  . Anemia   . CAD (coronary artery disease)   . Peripheral vascular disease   . Hypertension   . Cancer     colon; s/p colectomy 2007  . Anemia, iron deficiency 01/17/2012  . Hemorrhoids   . Nosebleed   . Stroke   . Cellulitis 03/08/12  . Diabetes mellitus     IDDM ; Type 2 for 54 years  . Hypothyroidism     since childhood  . Shortness of breath dyspnea   . Depression   . Wound infection     on right foot   . Diabetic foot ulcers   . RBBB (right bundle branch block with left anterior fascicular block)   . Chronic kidney disease   . Edema of both legs       Assessment: 84 YOF with non-healing LE wounds to continue on Cipro from PTA.  Patient with reduced renal clearance.   Goal of Therapy:  Resolution of infection   Plan:  - Change Cipro to 500mg  PO daily - Monitor renal fxn, clinical progress    Kamri Gotsch D. Mina Marble, PharmD, BCPS Pager:  [phone] 07/06/2014, 3:47 PM

## 2014-07-06 NOTE — Anesthesia Postprocedure Evaluation (Signed)
  Anesthesia Post-op Note  Patient: Sandra [name]  Procedure(s) Performed: Procedure(s) (LRB): RIGHT  ABOVE THE KNEE AMPUTATION (Right)  Patient Location: PACU  Anesthesia Type: General  Level of Consciousness: awake and alert   Airway and Oxygen Therapy: Patient Spontanous Breathing  Post-op Pain: mild  Post-op Assessment: Post-op Vital signs reviewed, Patient's Cardiovascular Status Stable, Respiratory Function Stable, Patent Airway and No signs of Nausea or vomiting  Last Vitals:  Filed Vitals:   07/06/14 1300  BP: 109/37  Pulse: 67  Temp: 36.3 C  Resp: 20    Post-op Vital Signs: stable   Complications: No apparent anesthesia complications

## 2014-07-06 NOTE — H&P (Signed)
[facility]  Patient name: Sandra [name]: [id number] DOB: 1932/01/25Sex: female  REASON FOR VISIT: Follow up of bilateral lower extremity wounds.  HPI: Sandra [name] is a 81 y.o. female who I last saw in July 2015. She has had wounds of both lower extremities. I originally saw her in December 2014. At that time she had reasonable flow in the left side. Flow on the right side was compromised and we considered a right femoropopliteal bypass graft however I felt that she would be at very high-risk because of her age, obesity, and comorbidities. Her wounds were gradually improving and therefore elected not to proceed with revascularization. When I saw her in July, she did not have any open wounds. We were continuing with conservative treatment. It had been able while since she had her previous arteriogram and if we were to consider revascularization in the future the plan would be to repeat her study.  Of note, I did review the arteriogram from June 2014. There was no significant aortoiliac occlusive disease. On the right side the common femoral and deep femoral artery were patent. The superficial femoral artery was occluded proximally with reconstitution of the above-knee popliteal artery. There was single-vessel runoff via the anterior tibial artery on the right. On the left side the common femoral and deep femoral artery were patent. There was mild diffuse disease of the superficial femoral artery. The left popliteal artery was patent. There was anterior tibial runoff on the left. Peroneal and posterior tibial arteries on the left were occluded.  Since I saw her last, she has developed a wound on her metatarsal head of the right great toe. There is also a wound on the right second toe. She has significant edema of both lower extremities and cellulitis of the right leg. There are no wounds on the left foot. I have reviewed her records from the wound care  center. She continues aggressive outpatient wound care. I have also reviewed her records from Dr. Jess Barters office. He felt that if she was a candidate for revascularization she could potentially heal a first ray amputation. If she was not a candidate she would require more proximal amputation. She was started on Cipro. This visit was on 05/11/2014.  Her activity is very limited and she has significant dyspnea with any activity. I do not get any clear-cut history of rest pain in the foot.   Past Medical History  Diagnosis Date  . Anemia   . Diabetes mellitus   . CAD (coronary artery disease)   . Peripheral vascular disease   . Hypertension   . Cancer     colon; s/p colectomy 2007  . Anemia, iron deficiency 01/17/2012  . Hemorrhoids   . Nosebleed   . Stroke   . Cellulitis 03/08/12   Family History  Problem Relation Age of Onset  . Cancer Mother   . Diabetes Mother   . Other Father     bleeding problems  . Diabetes Sister   . Heart disease Daughter     before age 48  . Cancer Son    SOCIAL HISTORY: History  Substance Use Topics  . Smoking status: Former Smoker -- 1.00 packs/day for 13 years    Types: Cigarettes    Start date: 01/09/1955    Quit date: 03/13/1967  . Smokeless tobacco: Never Used     Comment: quit 45 years ago  . Alcohol Use: No   Allergies  Allergen Reactions  . Blackberry [Rubus Fruticosus]  Can't take because of Cancer  . Meloxicam Shortness Of Breath  . Procaine Hcl Swelling  . Meloxicam    Current Outpatient Prescriptions  Medication Sig Dispense Refill  . Amino Acids-Protein Hydrolys (FEEDING SUPPLEMENT, PRO-STAT SUGAR FREE 64,) LIQD Take 30 mLs by mouth 3 (three) times daily with meals. 900 mL 0  . amLODipine (NORVASC) 5 MG tablet Take 5 mg by mouth daily.    Marland Kitchen aspirin EC 81 MG tablet Take 81 mg by mouth every  evening.    . BD PEN NEEDLE NANO U/F 32G X 4 MM MISC     . cholecalciferol (VITAMIN D) 1000 UNITS tablet Take 1,000 Units by mouth daily.    . ciprofloxacin (CIPRO) 500 MG tablet Take 500 mg by mouth 2 (two) times daily.    Marland Kitchen FLUoxetine (PROZAC) 20 MG tablet Take 20 mg by mouth daily.    . furosemide (LASIX) 20 MG tablet Take 40 mg by mouth daily.     . Insulin Glargine (LANTUS SOLOSTAR) 100 UNIT/ML Solostar Pen Inject 40 Units into the skin daily at 10 pm. 40 units    . levothyroxine (SYNTHROID, LEVOTHROID) 125 MCG tablet Take 125 mcg by mouth. Synthroid ONlY    . losartan (COZAAR) 100 MG tablet Take 100 mg by mouth daily.    Marland Kitchen PRODIGY NO CODING BLOOD GLUC test strip     . doxycycline (VIBRA-TABS) 100 MG tablet Take 1 tablet (100 mg total) by mouth 2 (two) times daily. (Patient not taking: Reported on 05/19/2014) 14 tablet 0   No current facility-administered medications for this visit.   REVIEW OF SYSTEMS: Valu.Nieves ] denotes positive finding; [ ]  denotes negative finding  CARDIOVASCULAR: [ ]  chest pain [ ]  chest pressure [ ]  palpitations Valu.Nieves ] orthopnea  Valu.Nieves ] dyspnea on exertion [ ]  claudication [ ]  rest pain [ ]  DVT [ ]  phlebitis PULMONARY: [ ]  productive cough [ ]  asthma [ ]  wheezing NEUROLOGIC: Valu.Nieves ] weakness [ ]  paresthesias [ ]  aphasia [ ]  amaurosis [ ]  dizziness HEMATOLOGIC: [ ]  bleeding problems [ ]  clotting disorders MUSCULOSKELETAL: Valu.Nieves ] joint pain [ ]  joint swelling [ ]  leg swelling GASTROINTESTINAL: [ ]  blood in stool [ ]  hematemesis GENITOURINARY: [ ]  dysuria [ ]  hematuria PSYCHIATRIC: [ ]  history of major depression INTEGUMENTARY: [ ]  rashes [ ]  ulcers CONSTITUTIONAL: [ ]  fever [ ]  chills  PHYSICAL EXAM: Filed Vitals:   05/19/14 1514  BP: 135/46  Pulse: 74  Temp: 97.9 F (36.6 C)  TempSrc: Oral  Resp: 20  Height: 5\' 1"  (1.549 m)  Weight: 207  lb (93.895 kg)  SpO2: 92%   Body mass index is 39.13 kg/(m^2). GENERAL: The patient is a well-nourished female, in no acute distress. The vital signs are documented above. CARDIOVASCULAR: There is a regular rate and rhythm. I do not detect carotid bruits. She has palpable femoral pulses. I cannot palpate pedal pulses. She has significant bilateral lower extremity swelling. PULMONARY: There is good air exchange bilaterally without wheezing or rales. ABDOMEN: Soft and non-tender with normal pitched bowel sounds.  MUSCULOSKELETAL: There are no major deformities or cyanosis. NEUROLOGIC: No focal weakness or paresthesias are detected. SKIN: she has a wound about the size of a quarter over her right metatarsal on the medial aspect of the foot. The right second toe also has a wound. PSYCHIATRIC: The patient has a normal affect.  DATA:  I have reviewed her previous arteriogram. The results are described above. This was over a  year and a half ago.  I have reviewed her arterial Doppler study from 04/22/2014. She had an ABI 52% on the right and 63% on the left. She had a monophasic anterior tibial signal on the right with no posterior tibial or peroneal signal. On the left side she had a posterior tibial and anterior tibial signal which was monophasic.  MEDICAL ISSUES:  ATHEROSCLEROSIS WITH ULCERATION: This is a complicated situation in that the patient has a nonhealing wound of the right foot with significant infrainguinal arterial occlusive disease. She is 81 years old with a BMI of 40 and significant medical comorbidities. She would be at very high-risk for attempted revascularization with no guarantee of success. I have explained that I think the safest approach would be a right above-the-knee amputation given that it is unlikely she would be a candidate for a prosthesis because of her weight and age. We had a hard time even getting her into the chair to examine her. If she felt strongly about  trying to salvage the right foot then I would need to repeat her arteriogram in order to determine if she is a candidate for revascularization. There is a small chance that the superficial femoral artery occlusive disease could be addressed with an endovascular approach however she also has tibial artery occlusive disease and in June 2014 only had runoff via the anterior tibial artery. This may have also progressed. I've explained that revascularization would be associated with significant risk with no guarantee of success. If she were to elect to attempt limb salvage and certainly she would need preoperative evaluation by Dr. Marlou Porch, her cardiologist.    DICKSON,CHRISTOPHER S [facility]: [phone]

## 2014-07-06 NOTE — Transfer of Care (Signed)
Immediate Anesthesia Transfer of Care Note  Patient: Sandra [name]  Procedure(s) Performed: Procedure(s): RIGHT  ABOVE THE KNEE AMPUTATION (Right)  Patient Location: PACU  Anesthesia Type:General  Level of Consciousness: awake and alert   Airway & Oxygen Therapy: Patient Spontanous Breathing and Patient connected to nasal cannula oxygen  Post-op Assessment: Report given to RN, Post -op Vital signs reviewed and stable and Patient moving all extremities X 4  Post vital signs: Reviewed and stable  Last Vitals:  Filed Vitals:   07/06/14 1300  BP:   Temp: 36.3 C  Resp:     Complications: No apparent anesthesia complications

## 2014-07-06 NOTE — Progress Notes (Signed)
Pt refused torsemide this evening. Pt stated "I dont want to take that, it will make me pee."

## 2014-07-06 NOTE — Anesthesia Preprocedure Evaluation (Signed)
Anesthesia Evaluation  Patient identified by MRN, date of birth, ID band Patient awake    Reviewed: Allergy & Precautions, NPO status , Patient's Chart, lab work & pertinent test results  Airway Mallampati: II  TM Distance: >3 FB Neck ROM: Full    Dental no notable dental hx.    Pulmonary neg pulmonary ROS, former smoker,  breath sounds clear to auscultation  Pulmonary exam normal       Cardiovascular hypertension, Pt. on medications + Peripheral Vascular Disease Rhythm:Regular Rate:Normal     Neuro/Psych CVA negative psych ROS   GI/Hepatic negative GI ROS, Neg liver ROS,   Endo/Other  diabetes, Insulin DependentMorbid obesity  Renal/GU negative Renal ROS  negative genitourinary   Musculoskeletal negative musculoskeletal ROS (+)   Abdominal   Peds negative pediatric ROS (+)  Hematology negative hematology ROS (+)   Anesthesia Other Findings   Reproductive/Obstetrics negative OB ROS                             Anesthesia Physical Anesthesia Plan  ASA: III  Anesthesia Plan: General   Post-op Pain Management:    Induction: Intravenous  Airway Management Planned: Oral ETT  Additional Equipment:   Intra-op Plan:   Post-operative Plan: Extubation in OR  Informed Consent: I have reviewed the patients History and Physical, chart, labs and discussed the procedure including the risks, benefits and alternatives for the proposed anesthesia with the patient or authorized representative who has indicated his/her understanding and acceptance.   Dental advisory given  Plan Discussed with: CRNA and Surgeon  Anesthesia Plan Comments:         Anesthesia Quick Evaluation

## 2014-07-06 NOTE — Consult Note (Signed)
[facility] Medical Consultation  Sandra [name] TDV:[id number] DOB: 1930-03-27 DOA: 07/06/2014 PCP:  Melinda Crutch, MD   Requesting physician: Dr. Scot Dock, Vascular Surgery Date of consultation: 07/06/2014 Reason for consultation: General medical management - specifically DM and fluid management  Impression/Recommendations Active Problems:   DM type 2 causing renal disease   PVD (peripheral vascular disease)   Morbid obesity   Essential hypertension   Hypothyroidism   History of colon cancer   Non-healing wound of lower extremity   Diastolic dysfunction  Bilateral lower extremity non healing wounds S/P Right AKA 07/06/2014 Management per primary service.  Fluid management Not currently having dyspnea or SOB.  CXR, BNP, albumin pending.   06/2014 2D echo shows elevated pulmonary artery pressure and elevated end diastolic pressure On demedex 40 mg daily. Will wait for lab work and CXR before [place] with additional medications.   Further, BP is too low to aggressively diurese right now. Strict Is and Os.  Daily weights. Low salt diet.    DM Cbgs are actually running low (80s) at home. Will decrease lantus dose to 30 units and add SSI - S. Carb modified diet.  Yeast infection of the Pannus Nystatin Cream WOC recs requested please. Patient denies urinary symptoms  Hypoalbuminemia Likely poor nutrition.  Patient lives in [place] (not SNF) Nutrition consultation.  Acute on CKD - GFR 27 Stage 3-4.  Base line creatinine appears to be approx 1.45. Current creatinine 1.93. Hold ARB.  BP is currently low post op. Check BMET  Hypothyroidism Continue Synthroid.  HTN Currently relatively hypotensive post op. Will hold amlodipine and losartan.  Patient has prn metoprolol and hydralazine ordered.  Left shoulder pain Patient states she has a torn rotator cuff.     [facility] will followup again tomorrow. Please contact me if I can be of assistance in the meanwhile.  Thank you for this consultation.  Chief Complaint: Consult for medical management  HPI:  Sandra [name] is a 81 year old female who lives in independent living. She presented to the hospital today for planned left AKA. Dr. Scot Dock is admitting her postop. He has asked [place] to consult for general medical management, and primarily diabetes and fluid management. The patient has a past medical history of chronic diabetic foot ulcers, colon cancer, coronary artery disease, stroke, diabetes mellitus, CKD and hypothyroidism.   Review of Systems:  The patient is immediately postop but states that she has no headache or changes in vision, no difficulty swallowing, no chest pain, no abdominal pain, no changes in bowel habits, no dysuria. She is complaining about pain at the surgical site. She also notes a painful weak left arm due to previous injury.   Past Medical History  Diagnosis Date  . Anemia   . CAD (coronary artery disease)   . Peripheral vascular disease   . Hypertension   . Cancer     colon; s/p colectomy 2007  . Anemia, iron deficiency 01/17/2012  . Hemorrhoids   . Nosebleed   . Stroke   . Cellulitis 03/08/12  . Diabetes mellitus     IDDM ; Type 2 for 54 years  . Hypothyroidism     since childhood  . Shortness of breath dyspnea   . Depression   . Wound infection     on right foot   . Diabetic foot ulcers   . RBBB (right bundle branch block with left anterior fascicular block)   . Chronic kidney disease   . Edema of both legs  Past Surgical History  Procedure Laterality Date  . Appendectomy    . Cholecystectomy    . Tonsilectomy, adenoidectomy, bilateral myringotomy and tubes    . Colon surgery      for colon CA  . Lower extremity angiogram  September 01, 2012  . Aortogram  September 01, 2012  . Abdominal aortagram N/A 09/01/2012    Procedure: ABDOMINAL Maxcine Ham;  Surgeon: Angelia Mould, MD;  Location[facility] C[facility] LAB;  Service: Cardiovascular;  Laterality: N/A;  . Lower  extremity angiogram Bilateral 09/01/2012    Procedure: LOWER EXTREMITY ANGIOGRAM;  Surgeon: Angelia Mould, MD;  Location: [facility] CATH LAB;  Service: Cardiovascular;  Laterality: Bilateral;   Social History:  reports that she quit smoking about 47 years ago. Her smoking use included Cigarettes. She started smoking about 59 years ago. She has a 13 pack-year smoking history. She has never used smokeless tobacco. She reports that she does not drink alcohol or use illicit drugs. she lives in independent living. Prior to this surgery she mostly was wheelchair-bound.   Allergies  Allergen Reactions  . Blackberry [Rubus Fruticosus]     Can't take because of Cancer  . Meloxicam Shortness Of Breath  . Procaine Hcl Swelling  . Alendronate Sodium Other (See Comments)    constipation  . Meloxicam    Family History  Problem Relation Age of Onset  . Cancer Mother   . Diabetes Mother   . Other Father     bleeding problems  . Diabetes Sister   . Heart disease Daughter     before age 79  . Cancer Son     Prior to Admission medications   Medication Sig Start Date End Date Taking? Authorizing Provider  amLODipine (NORVASC) 5 MG tablet Take 5 mg by mouth daily.   Yes Historical Provider, MD  aspirin EC 81 MG tablet Take 81 mg by mouth every evening.   Yes Historical Provider, MD  BD PEN NEEDLE NANO U/F 32G X 4 MM MISC  05/13/13  Yes Historical Provider, MD  ciprofloxacin (CIPRO) 500 MG tablet Take 500 mg by mouth 2 (two) times daily. 05/11/14  Yes Historical Provider, MD  FLUoxetine (PROZAC) 20 MG tablet Take 20 mg by mouth daily.   Yes Historical Provider, MD  furosemide (LASIX) 20 MG tablet Take 40 mg by mouth daily.    Yes Historical Provider, MD  Insulin Glargine (LANTUS SOLOSTAR) 100 UNIT/ML Solostar Pen Inject 40 Units into the skin daily at 10 pm. 40 units 05/13/13  Yes Historical Provider, MD  levothyroxine (SYNTHROID, LEVOTHROID) 125 MCG tablet Take 125 mcg by mouth. Synthroid ONlY   Yes  Historical Provider, MD  losartan (COZAAR) 100 MG tablet Take 100 mg by mouth daily.   Yes Historical Provider, MD  nystatin (MYCOSTATIN) powder Apply topically 2 (two) times daily.    Yes Historical Provider, MD  PRODIGY NO CODING BLOOD GLUC test strip  08/25/12  Yes Historical Provider, MD  torsemide (DEMADEX) 20 MG tablet Take 40 mg by mouth 2 (two) times daily.   Yes Historical Provider, MD  Vitamin D, Ergocalciferol, (DRISDOL) 50000 UNITS CAPS capsule Take 50,000 Units by mouth daily.   Yes Historical Provider, MD  doxycycline (VIBRA-TABS) 100 MG tablet Take 1 tablet (100 mg total) by mouth 2 (two) times daily. Patient not taking: Reported on 06/29/2014 01/14/14   Cristal Ford, DO   Physical Exam: Blood pressure 129/59, pulse 69, temperature 97.9 F (36.6 C), temperature source Oral, resp. rate 18, weight  106.595 kg (235 lb), SpO2 96 %. Filed Vitals:   07/06/14 1505  BP: 129/59  Pulse: 69  Temp: 97.9 F (36.6 C)  Resp: 18     General:  obese, elderly, pleasant female lying in PACU.  Eyes: pupils are equal and round, sclera clear conjunctiva pink. There is no drainage.  ENT: moist because membranes, no erythema or exudates. Oropharynx.   Neck: supple and without lymphadenopathy.  Cardiovascular: Regular rate and rhythm, with 2/6 murmur at the left costal margin between ribs 4 and 5.   Respiratory: Clear to auscultation. Did not listen to the patient posteriorly.  Abdomen: obese, nontender, nondistended, positive bowel sounds.   Musculoskeletal: Chest deformity noted, patient with tender left shoulder. Left lower extremity wrapped in Ace bandage. Right lower extremity immediately status post AKA.   Psychiatric: Alert, oriented, cooperative, appropriate   Neurologic: cranial nerves II through XII are grossly intact, no acute focal neuro deficit.   Labs on Admission:  Basic Metabolic Panel:  Recent Labs Lab 07/02/14 1553 07/06/14 1425  NA 139 137  K 5.4* 5.1  CL  109 107  CO2 19 20  GLUCOSE 88 107*  BUN 51* 47*  CREATININE 1.67* 1.93*  CALCIUM 8.6 8.3*   CBC:  Recent Labs Lab 07/02/14 1553 07/06/14 1425  WBC 7.3 7.7  HGB 10.3* 9.2*  HCT 33.0* 28.9*  MCV 91.2 89.8  PLT 163 156   CBG:  Recent Labs Lab 07/06/14 0845 07/06/14 1030 07/06/14 1307 07/06/14 1614  GLUCAP 94 92 96 116*    2D Echocardiogram  06/15/2014 Study Conclusions  - Left ventricle: Wall thickness was increased in a pattern of moderate LVH. Systolic function was normal. The estimated ejection fraction was in the range of 55% to 60%. Doppler parameters are consistent with elevated ventricular end-diastolic filling pressure. - Mitral valve: Severely calcified annulus. There was mild to moderate regurgitation. - Left atrium: The atrium was moderately dilated. - Right atrium: The atrium was mildly dilated. - Tricuspid valve: There was moderate regurgitation. - Pulmonary arteries: PA peak pressure: 75 mm Hg (S). - Pericardium, extracardiac: A trivial pericardial effusion was identified.   Radiological Exams  CXR pending.   EKG: RBBB (found on previous exams as well), borderline prolonged QTc  Time spent: 50 min.  Karen Kitchens [facility] Pager [phone]  If 7PM-7AM, please contact night-coverage [email or web address] Password [facility] 07/06/2014, 6:31 PM

## 2014-07-06 NOTE — Progress Notes (Signed)
Several short stay staff used to assist pt from car using steady lift.  Pt's bilateral legs weeping saturating clothing and bandages.  Chuck pads placed under pt's legs.  Son concerned about pt's left leg with increased redness, weeping.  MD in surgery, called into OR room to make aware.  Pt had peanut butter cracker with lantus shot this AM at 0100.  Dr. Kalman Shan anesthesia made aware.  Also MD shown EKG.

## 2014-07-07 ENCOUNTER — Encounter (HOSPITAL_COMMUNITY): Payer: Self-pay | Admitting: Vascular Surgery

## 2014-07-07 ENCOUNTER — Inpatient Hospital Stay (HOSPITAL_COMMUNITY): Payer: Medicare Other

## 2014-07-07 ENCOUNTER — Telehealth: Payer: Self-pay | Admitting: Vascular Surgery

## 2014-07-07 DIAGNOSIS — I10 Essential (primary) hypertension: Secondary | ICD-10-CM

## 2014-07-07 DIAGNOSIS — E1122 Type 2 diabetes mellitus with diabetic chronic kidney disease: Secondary | ICD-10-CM

## 2014-07-07 DIAGNOSIS — I70235 Atherosclerosis of native arteries of right leg with ulceration of other part of foot: Secondary | ICD-10-CM

## 2014-07-07 DIAGNOSIS — I739 Peripheral vascular disease, unspecified: Principal | ICD-10-CM

## 2014-07-07 DIAGNOSIS — Z89611 Acquired absence of right leg above knee: Secondary | ICD-10-CM

## 2014-07-07 DIAGNOSIS — N189 Chronic kidney disease, unspecified: Secondary | ICD-10-CM

## 2014-07-07 LAB — CBC
HCT: 29.1 % — ABNORMAL LOW (ref 36.0–46.0)
Hemoglobin: 9.2 g/dL — ABNORMAL LOW (ref 12.0–15.0)
MCH: 28.5 pg (ref 26.0–34.0)
MCHC: 31.6 g/dL (ref 30.0–36.0)
MCV: 90.1 fL (ref 78.0–100.0)
Platelets: 159 10*3/uL (ref 150–400)
RBC: 3.23 MIL/uL — ABNORMAL LOW (ref 3.87–5.11)
RDW: 15.5 % (ref 11.5–15.5)
WBC: 8.9 10*3/uL (ref 4.0–10.5)

## 2014-07-07 LAB — HEPATIC FUNCTION PANEL
ALT: 13 U/L (ref 0–35)
AST: 21 U/L (ref 0–37)
Albumin: 2.4 g/dL — ABNORMAL LOW (ref 3.5–5.2)
Alkaline Phosphatase: 96 U/L (ref 39–117)
BILIRUBIN DIRECT: 0.3 mg/dL (ref 0.0–0.5)
Indirect Bilirubin: 0.4 mg/dL (ref 0.3–0.9)
Total Bilirubin: 0.7 mg/dL (ref 0.3–1.2)
Total Protein: 6.3 g/dL (ref 6.0–8.3)

## 2014-07-07 LAB — GLUCOSE, CAPILLARY
GLUCOSE-CAPILLARY: 85 mg/dL (ref 70–99)
Glucose-Capillary: 126 mg/dL — ABNORMAL HIGH (ref 70–99)
Glucose-Capillary: 107 mg/dL — ABNORMAL HIGH (ref 70–99)
Glucose-Capillary: 114 mg/dL — ABNORMAL HIGH (ref 70–99)
Glucose-Capillary: 150 mg/dL — ABNORMAL HIGH (ref 70–99)
Glucose-Capillary: 125 mg/dL — ABNORMAL HIGH (ref 70–99)

## 2014-07-07 LAB — BASIC METABOLIC PANEL
ANION GAP: 9 (ref 5–15)
BUN: 48 mg/dL — AB (ref 6–23)
CHLORIDE: 104 mmol/L (ref 96–112)
CO2: 21 mmol/L (ref 19–32)
Calcium: 8.3 mg/dL — ABNORMAL LOW (ref 8.4–10.5)
Creatinine, Ser: 2.24 mg/dL — ABNORMAL HIGH (ref 0.50–1.10)
GFR calc Af Amer: 22 mL/min — ABNORMAL LOW (ref >90)
GFR calc non Af Amer: 19 mL/min — ABNORMAL LOW (ref >90)
Glucose, Bld: 104 mg/dL — ABNORMAL HIGH (ref 70–99)
Potassium: 5.1 mmol/L (ref 3.5–5.1)
Sodium: 134 mmol/L — ABNORMAL LOW (ref 135–145)

## 2014-07-07 MED ORDER — SODIUM CHLORIDE 0.9 % IV SOLN
INTRAVENOUS | Status: AC
  Administered 2014-07-07 – 2014-07-08 (×2): via INTRAVENOUS

## 2014-07-07 MED ORDER — PRO-STAT SUGAR FREE PO LIQD
30.0000 mL | Freq: Two times a day (BID) | ORAL | Status: DC
  Administered 2014-07-07 – 2014-07-14 (×15): 30 mL via ORAL
  Filled 2014-07-07 (×16): qty 30

## 2014-07-07 NOTE — Progress Notes (Signed)
[facility] PROGRESS NOTE  LILYAUNA MIEDEMA QQV:[id number] DOB: December 30, 1930 DOA: 07/06/2014 PCP:  Melinda Crutch, MD  Assessment/Plan: Active Problems:   DM type 2 causing renal disease   PVD (peripheral vascular disease)   Morbid obesity   Essential hypertension   Hypothyroidism   History of colon cancer   Non-healing wound of lower extremity   Diastolic dysfunction   Acute renal failure    Bilateral lower extremity non healing wounds S/P Right AKA 07/06/2014 Management per primary service.  Fluid management Not currently having dyspnea or SOB. CXR, BNP, albumin pending.  06/2014 2D echo shows elevated pulmonary artery pressure and elevated end diastolic pressure On demedex 40 mg daily. Will wait for lab work and CXR before [place] with additional medications.  Further, BP is too low to aggressively diurese right now. Cutback on diuretics today given increasing creatinine and low urine output. Low salt diet.   DM Cbgs are actually running low (80s) at home. Will decrease lantus dose to 30 units and add SSI - S. Carb modified diet.  Yeast infection of the Pannus Nystatin Cream WOC recs requested please. Patient denies urinary symptoms  Hypoalbuminemia Likely poor nutrition. Patient lives in [place] (not SNF) Nutrition consultation.  Acute on CKD - GFR 27 Stage 3-4. Base line creatinine appears to be approx 1.45. Currently creatinine is 2.24 Hold ARB. Discontinue torsemide Hydrate patient with normal saline at 75 mL/h 12 hours Recheck  labs, if increasing creatinine will consult nephrology  Hypothyroidism Continue Synthroid.  HTN Currently relatively hypotensive post op. Will hold amlodipine and losartan. Patient has prn metoprolol and hydralazine ordered.  Left shoulder pain Patient states she has a torn rotator cuff.  Code Status: full Family Communication: family updated about patient's clinical progress Disposition Plan: Will  continue to follow along    Brief narrative: Sandra [name] is a 81 year old female who lives in independent living. She presented to the hospital today for planned left AKA. Dr. Scot Dock is admitting her postop. He has asked [place] to consult for general medical management, and primarily diabetes and fluid management. The patient has a past medical history of chronic diabetic foot ulcers, colon cancer, coronary artery disease, stroke, diabetes mellitus, CKD and hypothyroidism.   Consultants:  Hospitalists    Procedures: Right above-the-knee amputation  Antibiotics: Ciprofloxacin  HPI/Subjective: Blood pressure soft, noted decreasing urine output  Objective: Filed Vitals:   07/06/14 1505 07/06/14 2014 07/07/14 0443 07/07/14 1415  BP: 129/59 132/42 127/36 125/52  Pulse: 69 72 71 72  Temp: 97.9 F (36.6 C) 98.5 F (36.9 C) 98.7 F (37.1 C) 98.3 F (36.8 C)  TempSrc: Oral Oral Oral Oral  Resp: 18 18 18 18   Weight:   99.338 kg (219 lb)   SpO2: 96% 98% 98% 98%    Intake/Output Summary (Last 24 hours) at 07/07/14 1726 Last data filed at 07/07/14 1230  Gross per 24 hour  Intake    920 ml  Output      0 ml  Net    920 ml    Exam:  General: No acute respiratory distress Lungs: Clear to auscultation bilaterally without wheezes or crackles Cardiovascular: Regular rate and rhythm without murmur gallop or rub normal S1 and S2 Abdomen: Nontender, nondistended, soft, bowel sounds positive, no rebound, no ascites, no appreciable mass Extremities: No significant cyanosis, clubbing, or edema bilateral lower extremities      Data Reviewed: Basic Metabolic Panel:  Recent Labs Lab 07/02/14 1553 07/06/14 1425 07/07/14 0514  NA 139 137 134*  K 5.4* 5.1 5.1  CL 109 107 104  CO2 19 20 21   GLUCOSE 88 107* 104*  BUN 51* 47* 48*  CREATININE 1.67* 1.93* 2.24*  CALCIUM 8.6 8.3* 8.3*    Liver Function Tests:  Recent Labs Lab 07/07/14 0514  AST 21  ALT 13  ALKPHOS 96   BILITOT 0.7  PROT 6.3  ALBUMIN 2.4*   No results for input(s): LIPASE, AMYLASE in the last 168 hours. No results for input(s): AMMONIA in the last 168 hours.  CBC:  Recent Labs Lab 07/02/14 1553 07/06/14 1425 07/07/14 0514  WBC 7.3 7.7 8.9  HGB 10.3* 9.2* 9.2*  HCT 33.0* 28.9* 29.1*  MCV 91.2 89.8 90.1  PLT 163 156 159    Cardiac Enzymes: No results for input(s): CKTOTAL, CKMB, CKMBINDEX, TROPONINI in the last 168 hours. BNP (last 3 results)  Recent Labs  07/06/14 1425  BNP 254.3*    ProBNP (last 3 results) No results for input(s): PROBNP in the last 8760 hours.    CBG:  Recent Labs Lab 07/06/14 2335 07/07/14 0407 07/07/14 0754 07/07/14 1111 07/07/14 1610  GLUCAP 137* 126* 85 107* 114*    Recent Results (from the past 240 hour(s))  Surgical pcr screen     Status: Abnormal   Collection Time: 07/02/14  3:53 PM  Result Value Ref Range Status   MRSA, PCR POSITIVE (A) NEGATIVE Final   Staphylococcus aureus POSITIVE (A) NEGATIVE Final    Comment:        The Xpert SA Assay (FDA approved for NASAL specimens in patients over 94 years of age), is one component of a comprehensive surveillance program.  Test performance has been validated by [facility] for patients greater than or equal to 11 year old. It is not intended to diagnose infection nor to guide or monitor treatment.      Studies: Dg Chest 2 View  07/07/2014   CLINICAL DATA:  81 year old female with a history of chest pain and shortness of breath.  EXAM: CHEST - 2 VIEW  COMPARISON:  03/03/2014, 12/11/2013, 03/08/2012  FINDINGS: Cardiomediastinal silhouette unchanged. Atherosclerotic calcifications of the aortic arch.  Airspace opacities at the bilateral lung bases with associated interstitial opacities. Blunting of left costophrenic angle.  Calcifications of the mitral annulus.  Moderate pleural effusion on the lateral view.  No pneumothorax.  No displaced fracture.  Unremarkable appearance of  the upper abdomen.  Vague opacities overlying the chest likely external to the patient.  IMPRESSION: Interval development of bilateral pleural effusions and airspace disease, potentially representing edema and/or pneumonia.  Atherosclerosis and mitral annular calcifications.  Signed,  Dulcy Fanny. Earleen Newport, DO  [facility]  [facility]   Electronically Signed   By: Corrie Mckusick D.O.   On: 07/07/2014 15:33    Scheduled Meds: . aspirin EC  81 mg Oral QPM  . ciprofloxacin  500 mg Oral Q breakfast  . docusate sodium  100 mg Oral Daily  . enoxaparin (LOVENOX) injection  30 mg Subcutaneous Q24H  . feeding supplement (PRO-STAT SUGAR FREE 64)  30 mL Oral BID  . FLUoxetine  20 mg Oral Daily  . insulin aspart  0-9 Units Subcutaneous 6 times per day  . insulin aspart  3 Units Subcutaneous TID WC  . insulin glargine  30 Units Subcutaneous QHS  . levothyroxine  125 mcg Oral QAC breakfast  . nystatin   Topical BID  . pantoprazole  40 mg Oral Daily  .  sodium chloride  3 mL Intravenous Q12H  . Vitamin D (Ergocalciferol)  50,000 Units Oral Daily   Continuous Infusions: . sodium chloride      Active Problems:   DM type 2 causing renal disease   PVD (peripheral vascular disease)   Morbid obesity   Essential hypertension   Hypothyroidism   History of colon cancer   Non-healing wound of lower extremity   Diastolic dysfunction   Acute renal failure    Time spent: 40 minutes   [facility] Pager [phone]. If 7PM-7AM, please contact night-coverage at [email or web address], password [facility] 07/07/2014, 5:26 PM  LOS: 1 day

## 2014-07-07 NOTE — Consult Note (Signed)
Physical Medicine and Rehabilitation Consult Reason for Consult: Right AKA Referring Physician: Dr. Deitra Mayo   HPI: Sandra [name] is a 81 y.o. right handed female with history of CAD maintained on aspirin, hypertension, CVA, chronic renal insufficiency with baseline creatinine 1.93, diabetes mellitus and peripheral neuropathy. Patient independent and sedentary with a walker and is a resident of [facility] independent living facility. Presented 07/06/2014 with chronic bilateral lower extremity wounds that progressed over time with increasing cellulitis to right lower extremity. Most recent vascular study shows infrainguinal arterial occlusive disease. Limb was not felt to be salvageable and underwent right AKA 07/06/2014 per Dr. Scot Dock. Postoperative pain management. Acute blood loss anemia 9.2 and monitored. Subcutaneous Lovenox for DVT prophylaxis. Wound care nurse follow-up for yeast rash in pannus folds as well as monitoring of skin wounds left lower extremity. Physical and occupational therapy evaluations are pending. M.D. has requested physical medicine rehabilitation consult.  Patient states she lives in a retirement community that has a Estate manager/land agent. She was able to ambulate to her bathroom but mainly stayed in a chair. She sometimes went down to the cafeteria. Also has left shoulder pain, chronic, rotator cuff injury Review of Systems  Respiratory:       Shortness of breath with exertion  Cardiovascular: Positive for leg swelling.  Gastrointestinal: Positive for constipation.  Genitourinary: Positive for urgency.  Musculoskeletal: Positive for myalgias.  Psychiatric/Behavioral: Positive for depression.  All other systems reviewed and are negative.  Past Medical History  Diagnosis Date  . Anemia   . CAD (coronary artery disease)   . Peripheral vascular disease   . Hypertension   . Cancer     colon; s/p colectomy 2007  . Anemia, iron deficiency 01/17/2012  .  Hemorrhoids   . Nosebleed   . Stroke   . Cellulitis 03/08/12  . Diabetes mellitus     IDDM ; Type 2 for 54 years  . Hypothyroidism     since childhood  . Shortness of breath dyspnea   . Depression   . Wound infection     on right foot   . Diabetic foot ulcers   . RBBB (right bundle branch block with left anterior fascicular block)   . Chronic kidney disease   . Edema of both legs    Past Surgical History  Procedure Laterality Date  . Appendectomy    . Cholecystectomy    . Tonsilectomy, adenoidectomy, bilateral myringotomy and tubes    . Colon surgery      for colon CA  . Lower extremity angiogram  September 01, 2012  . Aortogram  September 01, 2012  . Abdominal aortagram N/A 09/01/2012    Procedure: ABDOMINAL Maxcine Ham;  Surgeon: Angelia Mould, MD;  Location: [facility] CATH LAB;  Service: Cardiovascular;  Laterality: N/A;  . Lower extremity angiogram Bilateral 09/01/2012    Procedure: LOWER EXTREMITY ANGIOGRAM;  Surgeon: Angelia Mould, MD;  Location: [facility] CATH LAB;  Service: Cardiovascular;  Laterality: Bilateral;   Family History  Problem Relation Age of Onset  . Cancer Mother   . Diabetes Mother   . Other Father     bleeding problems  . Diabetes Sister   . Heart disease Daughter     before age 37  . Cancer Son    Social History:  reports that she quit smoking about 47 years ago. Her smoking use included Cigarettes. She started smoking about 59 years ago. She has a 13 pack-year smoking history. She has  never used smokeless tobacco. She reports that she does not drink alcohol or use illicit drugs. Allergies:  Allergies  Allergen Reactions  . Blackberry [Rubus Fruticosus]     Can't take because of Cancer  . Meloxicam Shortness Of Breath  . Procaine Hcl Swelling  . Alendronate Sodium Other (See Comments)    constipation  . Meloxicam    Medications Prior to Admission  Medication Sig Dispense Refill  . amLODipine (NORVASC) 5 MG tablet Take 5 mg by mouth daily.    Marland Kitchen  aspirin EC 81 MG tablet Take 81 mg by mouth every evening.    . BD PEN NEEDLE NANO U/F 32G X 4 MM MISC     . ciprofloxacin (CIPRO) 500 MG tablet Take 500 mg by mouth 2 (two) times daily.    Marland Kitchen FLUoxetine (PROZAC) 20 MG tablet Take 20 mg by mouth daily.    . furosemide (LASIX) 20 MG tablet Take 40 mg by mouth daily.     . Insulin Glargine (LANTUS SOLOSTAR) 100 UNIT/ML Solostar Pen Inject 40 Units into the skin daily at 10 pm. 40 units    . levothyroxine (SYNTHROID, LEVOTHROID) 125 MCG tablet Take 125 mcg by mouth. Synthroid ONlY    . losartan (COZAAR) 100 MG tablet Take 100 mg by mouth daily.    Marland Kitchen nystatin (MYCOSTATIN) powder Apply topically 2 (two) times daily.     Marland Kitchen PRODIGY NO CODING BLOOD GLUC test strip     . torsemide (DEMADEX) 20 MG tablet Take 40 mg by mouth 2 (two) times daily.    . Vitamin D, Ergocalciferol, (DRISDOL) 50000 UNITS CAPS capsule Take 50,000 Units by mouth daily.    Marland Kitchen doxycycline (VIBRA-TABS) 100 MG tablet Take 1 tablet (100 mg total) by mouth 2 (two) times daily. (Patient not taking: Reported on 06/29/2014) 14 tablet 0    Home:    Functional History:   Functional Status:  Mobility:          ADL:    Cognition: Cognition Orientation Level: Oriented X4    Blood pressure 127/36, pulse 71, temperature 98.7 F (37.1 C), temperature source Oral, resp. rate 18, weight 99.338 kg (219 lb), SpO2 98 %. Physical Exam  Constitutional: She is oriented to person, place, and time.  HENT:  Head: Normocephalic.  Eyes: EOM are normal.  Neck: Normal range of motion. Neck supple. No thyromegaly present.  Cardiovascular: Normal rate and regular rhythm.   Respiratory: Effort normal and breath sounds normal. No respiratory distress.  GI: Soft. Bowel sounds are normal. She exhibits no distension.  Neurological: She is alert and oriented to person, place, and time.  Skin:  Right AKA is dressed appropriately tender. Soft Ace wrap dressing to left lower extremity  Motor  strength is 5/5 in the right deltoid, biceps, triceps, grip 4 minus left grip, 3 minus at the deltoid biceps triceps limited by pain Left lower extremity 3 minus in the hip flexor and knee extensor 3 minus ankle dorsiflexor plantar flexor General morbidly obese female in distress  Results for orders placed or performed during the hospital encounter of 07/06/14 (from the past 24 hour(s))  Glucose, capillary     Status: None   Collection Time: 07/06/14  8:45 AM  Result Value Ref Range   Glucose-Capillary 94 70 - 99 mg/dL   Comment 1 Notify RN    Comment 2 Document in Chart   Glucose, capillary     Status: None   Collection Time: 07/06/14 10:30 AM  Result  Value Ref Range   Glucose-Capillary 92 70 - 99 mg/dL  Glucose, capillary     Status: None   Collection Time: 07/06/14  1:07 PM  Result Value Ref Range   Glucose-Capillary 96 70 - 99 mg/dL  Brain natriuretic peptide     Status: Abnormal   Collection Time: 07/06/14  2:25 PM  Result Value Ref Range   B Natriuretic Peptide 254.3 (H) 0.0 - 100.0 pg/mL  CBC     Status: Abnormal   Collection Time: 07/06/14  2:25 PM  Result Value Ref Range   WBC 7.7 4.0 - 10.5 K/uL   RBC 3.22 (L) 3.87 - 5.11 MIL/uL   Hemoglobin 9.2 (L) 12.0 - 15.0 g/dL   HCT 28.9 (L) 36.0 - 46.0 %   MCV 89.8 78.0 - 100.0 fL   MCH 28.6 26.0 - 34.0 pg   MCHC 31.8 30.0 - 36.0 g/dL   RDW 15.5 11.5 - 15.5 %   Platelets 156 150 - 400 K/uL  Basic metabolic panel     Status: Abnormal   Collection Time: 07/06/14  2:25 PM  Result Value Ref Range   Sodium 137 135 - 145 mmol/L   Potassium 5.1 3.5 - 5.1 mmol/L   Chloride 107 96 - 112 mmol/L   CO2 20 19 - 32 mmol/L   Glucose, Bld 107 (H) 70 - 99 mg/dL   BUN 47 (H) 6 - 23 mg/dL   Creatinine, Ser 1.93 (H) 0.50 - 1.10 mg/dL   Calcium 8.3 (L) 8.4 - 10.5 mg/dL   GFR calc non Af Amer 23 (L) >90 mL/min   GFR calc Af Amer 27 (L) >90 mL/min   Anion gap 10 5 - 15  Glucose, capillary     Status: Abnormal   Collection Time: 07/06/14   4:14 PM  Result Value Ref Range   Glucose-Capillary 116 (H) 70 - 99 mg/dL  Glucose, capillary     Status: Abnormal   Collection Time: 07/06/14  8:10 PM  Result Value Ref Range   Glucose-Capillary 139 (H) 70 - 99 mg/dL  Glucose, capillary     Status: Abnormal   Collection Time: 07/06/14 11:35 PM  Result Value Ref Range   Glucose-Capillary 137 (H) 70 - 99 mg/dL  Glucose, capillary     Status: Abnormal   Collection Time: 07/07/14  4:07 AM  Result Value Ref Range   Glucose-Capillary 126 (H) 70 - 99 mg/dL   No results found.  Assessment/Plan: Diagnosis: Right AKA secondary to peripheral artery disease and infection, patient with chronic debility and morbid obesity as well as left shoulder contracture 1. Does the need for close, 24 hr/day medical supervision in concert with the patient's rehab needs make it unreasonable for this patient to be served in a less intensive setting? Potentially 2. Co-Morbidities requiring supervision/potential complications: Renal insufficiency, diabetes, Hypertension 3. Due to bladder management, bowel management, safety, skin/wound care, disease management, medication administration, pain management and patient education, does the patient require 24 hr/day rehab nursing? Potentially 4. Does the patient require coordinated care of a physician, rehab nurse, PT and OT to address physical and functional deficits in the context of the above medical diagnosis(es)? Potentially Addressing deficits in the following areas: balance, endurance, locomotion, strength, transferring, bowel/bladder control, bathing, dressing and toileting 5. Can the patient actively participate in an intensive therapy program of at least 3 hrs of therapy per day at least 5 days per week? No 6. The potential for patient to make measurable gains while  on inpatient rehab is poor 7. Anticipated functional outcomes upon discharge from inpatient rehab are n/a  with PT, n/a with OT, n/a with  SLP. 8. Estimated rehab length of stay to reach the above functional goals is: Not applicable 9. Does the patient have adequate social supports and living environment to accommodate these discharge functional goals? N/A 10. Anticipated D/C setting: Other 11. Anticipated post D/C treatments: N/A 12. Overall Rehab/Functional Prognosis: fair  RECOMMENDATIONS: This patient's condition is appropriate for continued rehabilitative care in the following setting: SNF Patient has agreed to participate in recommended program. Potentially Note that insurance prior authorization may be required for reimbursement for recommended care.  Comment: Given patient's chronic debility, left shoulder pain and contracture, as well as morbid obesity do not think she would be a little tolerate or participate in an intensive inpatient rehabilitation program    07/07/2014

## 2014-07-07 NOTE — Evaluation (Signed)
Occupational Therapy Evaluation Patient Details Name: Sandra [name] MRN: [id number] DOB: Apr 16, 1930 Today's Date: 07/07/2014    History of Present Illness Pt is an 81 y.o. Female s/p R AKA due to non-healing wound. PMH DM, CAD, PVD, HTN, CVA, colon cancer, and obesity with non-healing LE woulds.    Clinical Impression   PTA pt lived at [facility] and ambulated solely to bathroom and dining hall and reports independence with ADLs. Pt currently requires assist of +3 for mobility and total A for LB ADLs. Pt will benefit from SNF at d/c and will defer all further OT needs to next venue of care.     Follow Up Recommendations  SNF;Supervision/Assistance - 24 hour    Equipment Recommendations  3 in 1 bedside comode (bariatric drop arm)    Recommendations for Other Services       Precautions / Restrictions Precautions Precautions: Fall Precaution Comments: morbid obesity; LUE injury Restrictions Weight Bearing Restrictions: Yes RLE Weight Bearing: Non weight bearing      Mobility Bed Mobility Overal bed mobility: Needs Assistance;+2 for physical assistance Bed Mobility: Rolling;Sidelying to Sit Rolling: +2 for physical assistance;Mod assist Sidelying to sit: +2 for physical assistance;Max assist       General bed mobility comments: VC's to reach for bed rail with RUE. Pt required max A to rotate hips to EOB with use of bed pad and cried out in pain. Assist provided to elevate trunk off bed. Pt was able to assist with RUE.   Transfers Overall transfer level: Needs assistance Equipment used: Rolling walker (2 wheeled) Transfers: Sit to/from [employer] Sit to Stand: +2 physical assistance;Total assist   Squat pivot transfers: Total assist;+2 physical assistance     General transfer comment: +3 or more to assist pt to squat pivot with use of bed pad to transfer to [facility]. Pt required total A +3 to stand and additional person to perform pericare from [facility].          ADL Overall ADL's : Needs assistance/impaired Eating/Feeding: Independent;Sitting   Grooming: Set up;Sitting   Upper Body Bathing: Minimal assitance;Sitting   Lower Body Bathing: Total assistance;+2 for physical assistance;Sit to/from stand Lower Body Bathing Details (indicate cue type and reason): +3 to assist; 2 to stand and one to wash; additional person to stand is helpful Upper Body Dressing : Moderate assistance;Sitting   Lower Body Dressing: Total assistance;+2 for physical assistance;Sit to/from stand Lower Body Dressing Details (indicate cue type and reason): +3 or more to assist Toilet Transfer: Total assistance;+2 for physical assistance;Squat-pivot;BSC (+3 assist with use of bed pad to shift hips over)   Toileting- Clothing Manipulation and Hygiene: Total assistance;+2 for physical assistance;Sit to/from stand         General ADL Comments: Pt is limited by weakness and decreased mobility. She requires +3 or more to stand and perform LB ADLs/pericare. Pt has limited UE strength     Vision Additional Comments: No change from baseline          Pertinent Vitals/Pain Pain Assessment: Faces Faces Pain Scale: Hurts whole lot Pain Location: RLE Pain Descriptors / Indicators: Aching;Sharp;Tender;Throbbing Pain Intervention(s): Limited activity within patient's tolerance;Monitored during session;Repositioned;Relaxation     Hand Dominance Right   Extremity/Trunk Assessment Upper Extremity Assessment Upper Extremity Assessment: Generalized weakness;LUE deficits/detail LUE Deficits / Details: pt reports hx of rotator injury. Notable crepitus during movement.  LUE Coordination: decreased gross motor   Lower Extremity Assessment Lower Extremity Assessment: Defer to PT evaluation  Communication Communication Communication: No difficulties   Cognition Arousal/Alertness: Awake/alert Behavior During Therapy: Anxious Overall Cognitive Status: Within Functional  Limits for tasks assessed                                Home Living Family/patient expects to be discharged to:: Unsure (Simultaneous filing. User may not have seen previous data.) Living Arrangements: Alone                               Additional Comments: Pt lives at [facility] and ambulates solely to bathroom and to dining hall.       Prior Functioning/Environment Level of Independence: Independent        Comments: Dining hall at facility; Cleaning service comes in once a week    OT Diagnosis: Generalized weakness;Acute pain                       Co-evaluation PT/OT/SLP Co-Evaluation/Treatment: Yes Reason for Co-Treatment: For patient/therapist safety   OT goals addressed during session: ADL's and self-care      End of Session Equipment Utilized During Treatment: Gait belt;Rolling walker;Oxygen Nurse Communication: Mobility status  Activity Tolerance: Patient limited by pain;Patient limited by fatigue Patient left: in chair;with call bell/phone within reach;with chair alarm set;Other (comment) (on lift pad)   Time: 3545-6256 OT Time Calculation (min): 56 min Charges:  OT General Charges $OT Visit: 1 Procedure OT Evaluation $Initial OT Evaluation Tier I: 1 Procedure OT Treatments $Self Care/Home Management : 8-22 mins G-Codes:    Juluis Rainier 2014/07/27, 11:00 AM   Cyndie Chime, OTR/L Occupational Therapist [phone] (pager)

## 2014-07-07 NOTE — Clinical Social Work Note (Signed)
CSW spoke with pts facility- [facility]- and they confirmed that patient is part of their [place].  No need for further CSW involvement at this time- CSW signing off.  Domenica Reamer, [place] Social Worker [phone]

## 2014-07-07 NOTE — Evaluation (Signed)
Physical Therapy Evaluation Patient Details Name: Sandra [name] MRN: [id number] DOB: 01-Feb-1931 Today's Date: 07/07/2014   History of Present Illness  Pt is an 81 y.o. Female s/p R AKA due to non-healing wound. PMH DM, CAD, PVD, HTN, CVA, colon cancer, and obesity with non-healing LE woulds.   Clinical Impression  Pt admitted with above diagnosis. Pt currently with functional limitations due to the deficits listed below (see PT Problem List). At the time of PT eval pt required +3 assist for mobility, and +4 for static standing with walker and peri-care. Pt previously from an ILF and states that she has not walked in 2.5 years and has not had "strength" in her legs in 20 years. She does, however, admit that she walks to the bathroom and down to the dining hall and back every day at the [facility]. Pt will benefit from skilled PT to increase their independence and safety with mobility to allow discharge to the venue listed below.       Follow Up Recommendations SNF;Supervision/Assistance - 24 hour    Equipment Recommendations  Wheelchair (measurements PT);Wheelchair cushion (measurements PT);Rolling walker with 5" wheels    Recommendations for Other Services       Precautions / Restrictions Precautions Precautions: Fall Precaution Comments: morbid obesity; L rotator cuff tear Restrictions Weight Bearing Restrictions: Yes RLE Weight Bearing: Non weight bearing      Mobility  Bed Mobility Overal bed mobility: Needs Assistance;+2 for physical assistance Bed Mobility: Rolling;Sidelying to Sit Rolling: +2 for physical assistance;Mod assist Sidelying to sit: +2 for physical assistance;Max assist       General bed mobility comments: VC's to reach for bed rail with RUE. Pt required max A to rotate hips to EOB with use of bed pad and cried out in pain. Assist provided to elevate trunk off bed. Pt was able to assist with RUE.   Transfers Overall transfer level: Needs assistance Equipment used:  Rolling walker (2 wheeled) Transfers: Sit to/from [facility] Sit to Stand: +2 physical assistance;Total assist Stand pivot transfers: Total assist;+2 physical assistance Squat pivot transfers: Total assist;+2 physical assistance     General transfer comment: +3 or more to assist pt to squat pivot with use of bed pad to transfer to [facility]. Pt required total A +3 to stand and additional person to perform pericare from [facility].  Ambulation/Gait             General Gait Details: Further mobility deferred at this time.   Stairs            Wheelchair Mobility    Modified Rankin (Stroke Patients Only)       Balance Overall balance assessment: Needs assistance Sitting-balance support: Feet supported;Bilateral upper extremity supported Sitting balance-Leahy Scale: Poor Sitting balance - Comments: Requires UE support   Standing balance support: Bilateral upper extremity supported Standing balance-Leahy Scale: Zero                               Pertinent Vitals/Pain Pain Assessment: Faces Faces Pain Scale: Hurts whole lot Pain Location: RLE Pain Descriptors / Indicators: Aching;Operative site guarding;Tender;Throbbing Pain Intervention(s): Limited activity within patient's tolerance;Monitored during session;Repositioned;Relaxation    Home Living Family/patient expects to be discharged to:: Skilled nursing facility Living Arrangements: Alone               Additional Comments: Pt lives at [facility] and ambulates solely to bathroom and to dining hall.  Prior Function Level of Independence: Independent with assistive device(s)         Comments: Dining hall at facility; Cleaning service comes in once a week     Hand Dominance   Dominant Hand: Right    Extremity/Trunk Assessment   Upper Extremity Assessment: Defer to OT evaluation       LUE Deficits / Details: pt reports hx of rotator injury. Notable crepitus during movement.     Lower Extremity Assessment: Generalized weakness;RLE deficits/detail RLE Deficits / Details: AKA    Cervical / Trunk Assessment: Kyphotic  Communication   Communication: No difficulties  Cognition Arousal/Alertness: Awake/alert Behavior During Therapy: Anxious Overall Cognitive Status: Within Functional Limits for tasks assessed                      General Comments      Exercises        Assessment/Plan    PT Assessment Patient needs continued PT services  PT Diagnosis Difficulty walking;Generalized weakness;Acute pain   PT Problem List Decreased strength;Decreased range of motion;Decreased activity tolerance;Decreased balance;Decreased mobility;Decreased knowledge of use of DME;Decreased safety awareness;Decreased knowledge of precautions;Cardiopulmonary status limiting activity  PT Treatment Interventions DME instruction;Gait training;Functional mobility training;Therapeutic activities;Therapeutic exercise;Neuromuscular re-education;Patient/family education;Wheelchair mobility training   PT Goals (Current goals can be found in the Care Plan section) Acute Rehab PT Goals Patient Stated Goal: Be able to get to the bathroom PT Goal Formulation: With patient Time For Goal Achievement: 07/21/14 Potential to Achieve Goals: Fair    Frequency Min 2X/week   Barriers to discharge        Co-evaluation PT/OT/SLP Co-Evaluation/Treatment: Yes Reason for Co-Treatment: Complexity of the patient's impairments (multi-system involvement);For patient/therapist safety PT goals addressed during session: Mobility/safety with mobility;Balance;Proper use of DME OT goals addressed during session: ADL's and self-care       End of Session Equipment Utilized During Treatment: Gait belt;Oxygen Activity Tolerance: No increased pain;Patient limited by fatigue (weakness) Patient left: in chair;with chair alarm set;with call bell/phone within reach Nurse Communication: Mobility  status;Need for lift equipment         Time: 0934-1030 PT Time Calculation (min) (ACUTE ONLY): 56 min   Charges:   PT Evaluation $Initial PT Evaluation Tier I: 1 Procedure PT Treatments $Therapeutic Activity: 8-22 mins   PT G Codes:        Rolinda Roan 08/01/14, 1:44 PM   Rolinda Roan, PT, DPT Acute Rehabilitation Services Pager: [phone]

## 2014-07-07 NOTE — Telephone Encounter (Signed)
-----   Message from Mena Goes, RN sent at 07/07/2014  9:06 AM EDT ----- Regarding: Schedule   ----- Message -----    From: Gabriel Earing, PA-C    Sent: 07/07/2014   8:00 AM      To: [facility] Charge Pool  S/p right AKA 07/06/14.  F/u with [facility] in 4 weeks.  Thanks, Aldona Bar

## 2014-07-07 NOTE — Telephone Encounter (Signed)
Lm for pt re appt, dpm  °

## 2014-07-07 NOTE — Progress Notes (Addendum)
INITIAL NUTRITION ASSESSMENT  DOCUMENTATION CODES Per approved criteria  -Morbid Obesity   INTERVENTION: Prostat liquid protein po 30 ml BID with meals, each supplement provides 100 kcal, 15 grams protein RD to follow for nutrition care plan  NUTRITION DIAGNOSIS: Increased nutrient needs related to post-op healing as evidenced by estimated nutrition needs  Goal: Pt to meet >/= 90% of their estimated nutrition needs   Monitor:  PO & supplemental intake, weight, labs, I/O's  Reason for Assessment: Consult  81 y.o. female  Admitting Dx: non healing wounds R foot  ASSESSMENT: 81 y.o. Female with nonhealing wounds to right foot with infrainguinal arterial occlusive disease; admitted for surgical intervention.  Patient s/p procedure 4/26: RIGHT ABOVE-THE-KNEE AMPUTATION  RD consulted for hypoalbuminemia.  RD unable to obtain nutrition hx at time of visit.  Working with PT.  Currently on a Carbohydrate Modified diet with 75-100% PO intake.  Nutrient needs increased given post-op state, wound healing.  RD to order supplements.  Albumin has a half-life of 21 days and is strongly affected by stress response and inflammatory process, therefore, do not expect to see an improvement in this lab value during acute hospitalization.  Height: Ht Readings from Last 1 Encounters:  06/11/14 5\' 1"  (1.549 m)    Weight: Wt Readings from Last 1 Encounters:  07/07/14 219 lb (99.338 kg)    Ideal Body Weight: 105 lb  % Ideal Body Weight: 208%  Wt Readings from Last 10 Encounters:  07/07/14 219 lb (99.338 kg)  05/19/14 207 lb (93.895 kg)  01/14/14 194 lb 7.1 oz (88.2 kg)  01/14/14 194 lb (87.998 kg)  09/23/13 201 lb (91.173 kg)  07/30/13 214 lb 1.9 oz (97.124 kg)  07/15/13 221 lb (100.245 kg)  07/09/13 230 lb (104.327 kg)  02/25/13 219 lb (99.338 kg)  01/08/13 219 lb (99.338 kg)    Usual Body Weight: 207 lb  % Usual Body Weight: 105%  BMI:  46.4 kg/m2  Estimated Nutritional  Needs: Kcal: 1800-2000 Protein: 100-110 gm Fluid: 1.8-2.0 L  Skin: intertrigo to pannus folds  Diet Order: Diet Carb Modified Fluid consistency:: Thin; Room service appropriate?: Yes  EDUCATION NEEDS: -No education needs identified at this time   Intake/Output Summary (Last 24 hours) at 07/07/14 1001 Last data filed at 07/06/14 1717  Gross per 24 hour  Intake   1440 ml  Output    200 ml  Net   1240 ml    Labs:   Recent Labs Lab 07/02/14 1553 07/06/14 1425 07/07/14 0514  NA 139 137 134*  K 5.4* 5.1 5.1  CL 109 107 104  CO2 19 20 21   BUN 51* 47* 48*  CREATININE 1.67* 1.93* 2.24*  CALCIUM 8.6 8.3* 8.3*  GLUCOSE 88 107* 104*    CBG (last 3)   Recent Labs  07/06/14 2335 07/07/14 0407 07/07/14 0754  GLUCAP 137* 126* 85    Scheduled Meds: . aspirin EC  81 mg Oral QPM  . ciprofloxacin  500 mg Oral Q breakfast  . docusate sodium  100 mg Oral Daily  . enoxaparin (LOVENOX) injection  30 mg Subcutaneous Q24H  . FLUoxetine  20 mg Oral Daily  . insulin aspart  0-9 Units Subcutaneous 6 times per day  . insulin aspart  3 Units Subcutaneous TID WC  . insulin glargine  30 Units Subcutaneous QHS  . levothyroxine  125 mcg Oral QAC breakfast  . nystatin   Topical BID  . pantoprazole  40 mg Oral Daily  .  sodium chloride  3 mL Intravenous Q12H  . torsemide  40 mg Oral BID  . Vitamin D (Ergocalciferol)  50,000 Units Oral Daily    Continuous Infusions:   Past Medical History  Diagnosis Date  . Anemia   . CAD (coronary artery disease)   . Peripheral vascular disease   . Hypertension   . Cancer     colon; s/p colectomy 2007  . Anemia, iron deficiency 01/17/2012  . Hemorrhoids   . Nosebleed   . Stroke   . Cellulitis 03/08/12  . Diabetes mellitus     IDDM ; Type 2 for 54 years  . Hypothyroidism     since childhood  . Shortness of breath dyspnea   . Depression   . Wound infection     on right foot   . Diabetic foot ulcers   . RBBB (right bundle branch block  with left anterior fascicular block)   . Chronic kidney disease   . Edema of both legs     Past Surgical History  Procedure Laterality Date  . Appendectomy    . Cholecystectomy    . Tonsilectomy, adenoidectomy, bilateral myringotomy and tubes    . Colon surgery      for colon CA  . Lower extremity angiogram  September 01, 2012  . Aortogram  September 01, 2012  . Abdominal aortagram N/A 09/01/2012    Procedure: ABDOMINAL Maxcine Ham;  Surgeon: Angelia Mould, MD;  Location: [facility] CATH LAB;  Service: Cardiovascular;  Laterality: N/A;  . Lower extremity angiogram Bilateral 09/01/2012    Procedure: LOWER EXTREMITY ANGIOGRAM;  Surgeon: Angelia Mould, MD;  Location[facility] C[facility] LAB;  Service: Cardiovascular;  Laterality: Bilateral;    Arthur Holms, RD, LDN Pager #: [phone] After-Hours Pager #: [phone]

## 2014-07-07 NOTE — Progress Notes (Signed)
  Progress Note    07/07/2014 7:42 AM 1 Day Post-Op  Subjective:  No complaints at this time  Afebrile HR 60's-70's NSR 035'D-974'B systolic  63% 8GT3MI  Filed Vitals:   07/07/14 0443  BP: 127/36  Pulse: 71  Temp: 98.7 F (37.1 C)  Resp: 18    Physical Exam: Incisions:  Ace wrap in place with wound vac   CBC    Component Value Date/Time   WBC 8.9 07/07/2014 0514   WBC 10.5* 01/07/2014 1432   WBC 9.8 06/28/2009 1525   RBC 3.23* 07/07/2014 0514   RBC 3.59* 01/07/2014 1432   RBC 3.60* 01/07/2014 1432   RBC 3.73 06/28/2009 1525   HGB 9.2* 07/07/2014 0514   HGB 10.5* 01/07/2014 1432   HGB 10.7* 06/28/2009 1525   HCT 29.1* 07/07/2014 0514   HCT 32.5* 01/07/2014 1432   HCT 33.0* 06/28/2009 1525   PLT 159 07/07/2014 0514   PLT 249 01/07/2014 1432   PLT 272 06/28/2009 1525   MCV 90.1 07/07/2014 0514   MCV 91 01/07/2014 1432   MCV 88.6 06/28/2009 1525   MCH 28.5 07/07/2014 0514   MCH 29.2 01/07/2014 1432   MCH 28.7 06/28/2009 1525   MCHC 31.6 07/07/2014 0514   MCHC 32.3 01/07/2014 1432   MCHC 32.4 06/28/2009 1525   RDW 15.5 07/07/2014 0514   RDW 13.2 01/07/2014 1432   RDW 14.6* 06/28/2009 1525   LYMPHSABS 1.0 01/14/2014 0728   LYMPHSABS 1.0 01/07/2014 1432   LYMPHSABS 1.9 06/28/2009 1525   MONOABS 0.6 01/14/2014 0728   MONOABS 0.5 06/28/2009 1525   EOSABS 0.5 01/14/2014 0728   EOSABS 0.5 01/07/2014 1432   EOSABS 0.3 06/28/2009 1525   BASOSABS 0.1 01/14/2014 0728   BASOSABS 0.1 01/07/2014 1432   BASOSABS 0.1 06/28/2009 1525    BMET    Component Value Date/Time   NA 134* 07/07/2014 0514   NA 145 07/09/2013 1426   K 5.1 07/07/2014 0514   K 4.4 07/09/2013 1426   CL 104 07/07/2014 0514   CL 103 07/09/2013 1426   CO2 21 07/07/2014 0514   CO2 31 07/09/2013 1426   GLUCOSE 104* 07/07/2014 0514   GLUCOSE 183* 07/09/2013 1426   BUN 48* 07/07/2014 0514   BUN 34* 07/09/2013 1426   CREATININE 2.24* 07/07/2014 0514   CREATININE 1.4* 07/09/2013 1426   CALCIUM 8.3* 07/07/2014 0514   CALCIUM 9.2 07/09/2013 1426   GFRNONAA 19* 07/07/2014 0514   GFRAA 22* 07/07/2014 0514    INR    Component Value Date/Time   INR 1.17 07/02/2014 1553     Intake/Output Summary (Last 24 hours) at 07/07/14 0742 Last data filed at 07/06/14 1717  Gross per 24 hour  Intake   1440 ml  Output    200 ml  Net   1240 ml     Assessment/Plan:  81 y.o. female is s/p right above knee amputation  1 Day Post-Op  -pt doing well from surgical standpoint -wound vac in place with ace wrap -will take down ace wrap tomorrow-wound vac may be in place until discharge given the amount of edematous fluid present during surgery -appreciate medicine's help with medical management. -increase in creatinine-urine output not documented.  May need to change Lovenox to SQ heparin.  Will d/c IVF.   Leontine Locket, PA-C [facility] [phone] 07/07/2014 7:42 AM

## 2014-07-07 NOTE — Progress Notes (Addendum)
Inpatient Diabetes Program Recommendations  AACE/ADA: New Consensus Statement on Inpatient Glycemic Control (2013)  Target Ranges:  Prepandial:   less than 140 mg/dL      Peak postprandial:   less than 180 mg/dL (1-2 hours)      Critically ill patients:  140 - 180 mg/dL   Results for Sandra [name], Sandra [name] (MRN [id number]) as of 07/07/2014 08:31  Ref. Range 07/06/2014 08:45 07/06/2014 10:30 07/06/2014 13:07 07/06/2014 16:14 07/06/2014 20:10 07/06/2014 23:35 07/07/2014 04:07 07/07/2014 07:54  Glucose-Capillary Latest Ref Range: 70-99 mg/dL 94 92 96 116 (H) 139 (H) 137 (H) 126 (H) 85   Diabetes history: DM 2 Outpatient Diabetes medications: Lantus 40 units QHS Current orders for Inpatient glycemic control: Lantus 30 units QHS, Novolog 0-9 units TID, Novolog 3 units Meal coverage  Inpatient Diabetes Program Recommendations  Insulin - Basal: Noted patient has not had basal insulin and glucose in the 80's this am. May want to hold basal insulin or decrease significantly until glucose starts trending up. Noted patient is consuming 75 % of meals. Glucose has not risen above 137 mg/dl.  Thanks,  Tama Headings RN, MSN, [facility] Inpatient Diabetes Coordinator Team Pager [phone]

## 2014-07-07 NOTE — Progress Notes (Signed)
Rehab admissions - Evaluated for possible admission.  Please see rehab consult done by Dr. Letta Pate recommending SNF placement.  Call me for questions.  #[phone]

## 2014-07-08 ENCOUNTER — Other Ambulatory Visit: Payer: Medicare Other | Admitting: Lab

## 2014-07-08 ENCOUNTER — Ambulatory Visit: Payer: Medicare Other | Admitting: Family

## 2014-07-08 ENCOUNTER — Other Ambulatory Visit: Payer: Medicare Other

## 2014-07-08 ENCOUNTER — Inpatient Hospital Stay (HOSPITAL_COMMUNITY): Payer: Medicare Other

## 2014-07-08 LAB — CBC
HCT: 29.1 % — ABNORMAL LOW (ref 36.0–46.0)
Hemoglobin: 9 g/dL — ABNORMAL LOW (ref 12.0–15.0)
MCH: 28.1 pg (ref 26.0–34.0)
MCHC: 30.9 g/dL (ref 30.0–36.0)
MCV: 90.9 fL (ref 78.0–100.0)
PLATELETS: 160 10*3/uL (ref 150–400)
RBC: 3.2 MIL/uL — ABNORMAL LOW (ref 3.87–5.11)
RDW: 15.2 % (ref 11.5–15.5)
WBC: 8.8 10*3/uL (ref 4.0–10.5)

## 2014-07-08 LAB — COMPREHENSIVE METABOLIC PANEL
ALBUMIN: 2.2 g/dL — AB (ref 3.5–5.2)
ALT: 12 U/L (ref 0–35)
AST: 19 U/L (ref 0–37)
Alkaline Phosphatase: 89 U/L (ref 39–117)
Anion gap: 8 (ref 5–15)
BUN: 52 mg/dL — ABNORMAL HIGH (ref 6–23)
CHLORIDE: 103 mmol/L (ref 96–112)
CO2: 20 mmol/L (ref 19–32)
CREATININE: 2.57 mg/dL — AB (ref 0.50–1.10)
Calcium: 8.1 mg/dL — ABNORMAL LOW (ref 8.4–10.5)
GFR calc Af Amer: 19 mL/min — ABNORMAL LOW (ref >90)
GFR calc non Af Amer: 16 mL/min — ABNORMAL LOW (ref >90)
Glucose, Bld: 128 mg/dL — ABNORMAL HIGH (ref 70–99)
Potassium: 5.4 mmol/L — ABNORMAL HIGH (ref 3.5–5.1)
SODIUM: 131 mmol/L — AB (ref 135–145)
Total Bilirubin: 0.5 mg/dL (ref 0.3–1.2)
Total Protein: 5.8 g/dL — ABNORMAL LOW (ref 6.0–8.3)

## 2014-07-08 LAB — GLUCOSE, CAPILLARY
GLUCOSE-CAPILLARY: 105 mg/dL — AB (ref 70–99)
GLUCOSE-CAPILLARY: 115 mg/dL — AB (ref 70–99)
Glucose-Capillary: 107 mg/dL — ABNORMAL HIGH (ref 70–99)
Glucose-Capillary: 137 mg/dL — ABNORMAL HIGH (ref 70–99)
Glucose-Capillary: 99 mg/dL (ref 70–99)
Glucose-Capillary: 121 mg/dL — ABNORMAL HIGH (ref 70–99)

## 2014-07-08 MED ORDER — SODIUM CHLORIDE 0.9 % IV SOLN
INTRAVENOUS | Status: DC
  Administered 2014-07-08: 12:00:00 via INTRAVENOUS

## 2014-07-08 MED ORDER — SODIUM POLYSTYRENE SULFONATE 15 GM/60ML PO SUSP
30.0000 g | Freq: Once | ORAL | Status: AC
  Administered 2014-07-08: 30 g via ORAL
  Filled 2014-07-08: qty 120

## 2014-07-08 NOTE — Consult Note (Signed)
[facility] Admit Date: 07/06/2014 07/08/2014 Rexene Agent Requesting Physician:  Allyson Sabal MD  Reason for Consult:  AoCKD HPI:  81 year old female seen at the request of Dr. Allyson Sabal for the above issue. Patient has established peripheral vascular disease and underwent right above-the-knee amputation on 07/06/14 for nonhealing wounds of the right lower extremity. She has known chronic kidney disease with baseline serum creatinine of 1.4-1.6. She tells me that she sees Dr. Florene Glen in our office. Other pertinent medical history includes morbid obesity, atherosclerotic disease, long-standing type 2 diabetes, chronic lower extremity edema. Pertinent home medications include Lasix 40 mg daily, torsemide 40 mg twice daily, losartan. She has not received a losartan during her hospitalization. She received torsemide through 4/27. She does not have a Foley catheter. No recent renal ultrasound, one from 09/14/13 with normal size kidneys with normal echogenicity. No IV contrast or NSAID exposures identified.  Currently she tells me she has a good appetite most of her lungs. No nausea or vomiting. She has persistent dependent edema in the proximal thighs and posterior sacral area.   CREAT (mg/dl)  Date Value  07/09/2013 1.4*  01/08/2013 1.6*  06/15/2010 1.0   CREATININE, SER (mg/dL)  Date Value  07/08/2014 2.57*  07/07/2014 2.24*  07/06/2014 1.93*  07/02/2014 1.67*  01/14/2014 1.45*  01/11/2014 1.44*  01/09/2014 1.41*  01/08/2014 1.35*  01/07/2014 1.40*  01/07/2014 1.60*  ] I/Os: I/O last 3 completed shifts: In: 1280 [P.O.:1280] Out: -    ROS Balance of 12 systems is negative w/ exceptions as above  PMH  Past Medical History  Diagnosis Date  . Anemia   . CAD (coronary artery disease)   . Peripheral vascular disease   . Hypertension   . Cancer     colon; s/p colectomy 2007  . Anemia, iron deficiency 01/17/2012  . Hemorrhoids   . Nosebleed   . Stroke   . Cellulitis 03/08/12  .  Diabetes mellitus     IDDM ; Type 2 for 54 years  . Hypothyroidism     since childhood  . Shortness of breath dyspnea   . Depression   . Wound infection     on right foot   . Diabetic foot ulcers   . RBBB (right bundle branch block with left anterior fascicular block)   . Chronic kidney disease   . Edema of both legs    PSH  Past Surgical History  Procedure Laterality Date  . Appendectomy    . Cholecystectomy    . Tonsilectomy, adenoidectomy, bilateral myringotomy and tubes    . Colon surgery      for colon CA  . Lower extremity angiogram  September 01, 2012  . Aortogram  September 01, 2012  . Abdominal aortagram N/A 09/01/2012    Procedure: ABDOMINAL Maxcine Ham;  Surgeon: Angelia Mould, MD;  Location: [facility] CATH LAB;  Service: Cardiovascular;  Laterality: N/A;  . Lower extremity angiogram Bilateral 09/01/2012    Procedure: LOWER EXTREMITY ANGIOGRAM;  Surgeon: Angelia Mould, MD;  Location: [facility] CATH LAB;  Service: Cardiovascular;  Laterality: Bilateral;  . Amputation Right 07/06/2014    Procedure: RIGHT  ABOVE THE KNEE AMPUTATION;  Surgeon: Angelia Mould, MD;  Location[facility] OR;  Service: Vascular;  Laterality: Right;   FH  Family History  Problem Relation Age of Onset  . Cancer Mother   . Diabetes Mother   . Other Father     bleeding problems  . Diabetes Sister   . Heart disease Daughter  before age 67  . Cancer Son    SH  reports that she quit smoking about 47 years ago. Her smoking use included Cigarettes. She started smoking about 59 years ago. She has a 13 pack-year smoking history. She has never used smokeless tobacco. She reports that she does not drink alcohol or use illicit drugs. Allergies  Allergies  Allergen Reactions  . Blackberry [Rubus Fruticosus]     Can't take because of Cancer  . Meloxicam Shortness Of Breath  . Procaine Hcl Swelling  . Alendronate Sodium Other (See Comments)    constipation  . Meloxicam    Home medications Prior to  Admission medications   Medication Sig Start Date End Date Taking? Authorizing Provider  amLODipine (NORVASC) 5 MG tablet Take 5 mg by mouth daily.   Yes Historical Provider, MD  aspirin EC 81 MG tablet Take 81 mg by mouth every evening.   Yes Historical Provider, MD  BD PEN NEEDLE NANO U/F 32G X 4 MM MISC  05/13/13  Yes Historical Provider, MD  ciprofloxacin (CIPRO) 500 MG tablet Take 500 mg by mouth 2 (two) times daily. 05/11/14  Yes Historical Provider, MD  FLUoxetine (PROZAC) 20 MG tablet Take 20 mg by mouth daily.   Yes Historical Provider, MD  furosemide (LASIX) 20 MG tablet Take 40 mg by mouth daily.    Yes Historical Provider, MD  Insulin Glargine (LANTUS SOLOSTAR) 100 UNIT/ML Solostar Pen Inject 40 Units into the skin daily at 10 pm. 40 units 05/13/13  Yes Historical Provider, MD  levothyroxine (SYNTHROID, LEVOTHROID) 125 MCG tablet Take 125 mcg by mouth. Synthroid ONlY   Yes Historical Provider, MD  losartan (COZAAR) 100 MG tablet Take 100 mg by mouth daily.   Yes Historical Provider, MD  nystatin (MYCOSTATIN) powder Apply topically 2 (two) times daily.    Yes Historical Provider, MD  PRODIGY NO CODING BLOOD GLUC test strip  08/25/12  Yes Historical Provider, MD  torsemide (DEMADEX) 20 MG tablet Take 40 mg by mouth 2 (two) times daily.   Yes Historical Provider, MD  Vitamin D, Ergocalciferol, (DRISDOL) 50000 UNITS CAPS capsule Take 50,000 Units by mouth daily.   Yes Historical Provider, MD  doxycycline (VIBRA-TABS) 100 MG tablet Take 1 tablet (100 mg total) by mouth 2 (two) times daily. Patient not taking: Reported on 06/29/2014 01/14/14   Cristal Ford, DO    Current Medications Scheduled Meds: . aspirin EC  81 mg Oral QPM  . ciprofloxacin  500 mg Oral Q breakfast  . docusate sodium  100 mg Oral Daily  . enoxaparin (LOVENOX) injection  30 mg Subcutaneous Q24H  . feeding supplement (PRO-STAT SUGAR FREE 64)  30 mL Oral BID  . FLUoxetine  20 mg Oral Daily  . insulin aspart  0-9 Units  Subcutaneous 6 times per day  . insulin aspart  3 Units Subcutaneous TID WC  . insulin glargine  30 Units Subcutaneous QHS  . levothyroxine  125 mcg Oral QAC breakfast  . nystatin   Topical BID  . pantoprazole  40 mg Oral Daily  . sodium chloride  3 mL Intravenous Q12H  . sodium polystyrene  30 g Oral Once  . Vitamin D (Ergocalciferol)  50,000 Units Oral Daily   Continuous Infusions: . sodium chloride 75 mL/hr at 07/08/14 1220   PRN Meds:.sodium chloride, acetaminophen **OR** acetaminophen, bisacodyl, guaiFENesin-dextromethorphan, hydrALAZINE, labetalol, metoprolol, morphine injection, ondansetron, oxyCODONE, phenol, potassium chloride, senna-docusate, sodium chloride  CBC  Recent Labs Lab 07/06/14 1425 07/07/14 0514 07/08/14  0406  WBC 7.7 8.9 8.8  HGB 9.2* 9.2* 9.0*  HCT 28.9* 29.1* 29.1*  MCV 89.8 90.1 90.9  PLT 156 159 888   Basic Metabolic Panel  Recent Labs Lab 07/02/14 1553 07/06/14 1425 07/07/14 0514 07/08/14 0406  NA 139 137 134* 131*  K 5.4* 5.1 5.1 5.4*  CL 109 107 104 103  CO2 19 20 21 20   GLUCOSE 88 107* 104* 128*  BUN 51* 47* 48* 52*  CREATININE 1.67* 1.93* 2.24* 2.57*  CALCIUM 8.6 8.3* 8.3* 8.1*    Physical Exam  Blood pressure 116/37, pulse 66, temperature 98.7 F (37.1 C), temperature source Oral, resp. rate 17, height 5\' 1"  (1.549 m), weight 99.973 kg (220 lb 6.4 oz), SpO2 96 %. GEN: Morbidly obese female lying in bed ENT: Poor dentition, NCAT EYES: EOMI CV: RRR, normal S1 and S2 PULM: Clear bilaterally, limited ABD: Soft, nontender, no bruits SKIN: No rashes or lesions, surgical stump is clean dry and intact EXT: Has pitting edema in the posterior portions of the thighs as well as in the posterior sacral area   Assessment/Plan 41F with AoCKD (BL SCr 1.4-1.6) s/p R AKA 07/06/14 in setting of morbid obesity, DM2  1. AoCKD 1. BL SCr 1.4-1.6 2. Sees ?[facility] 3. Suspect related to volume shifts in perioperative period 4. WOuld have  expected decreased SCr post amputation 5. Need Renal US, UA 6. Hold IVFs given edema, but don't restart diuretics; let eat and follow closely Daily weights, Daily Renal Panel, Strict I/Os, Avoid nephrotoxins (NSAIDs, judicious IV Contrast) 1. S/p R AKA for PVD and nonhealing leg wounds 2. Morbid Obesity 3. Hypervolemia / Edema; As per #1 4. DM2 5. HTN   Pearson Grippe MD [phone] pgr 07/08/2014, 3:01 PM

## 2014-07-08 NOTE — Progress Notes (Addendum)
Progress Note    07/08/2014 7:30 AM 2 Days Post-Op  Subjective:  No complaints this am  Afebrile HR 78'L NSR 381'O systolic 175% [id number]   Filed Vitals:   07/08/14 0417  BP: 138/45  Pulse: 66  Temp: 98.1 F (36.7 C)  Resp: 18    Physical Exam: Incisions:  C/d/i with staples; mild bloody drainage from medial aspect of incision. Extremities:  Can lift stump pretty well  CBC    Component Value Date/Time   WBC 8.8 07/08/2014 0406   WBC 10.5* 01/07/2014 1432   WBC 9.8 06/28/2009 1525   RBC 3.20* 07/08/2014 0406   RBC 3.59* 01/07/2014 1432   RBC 3.60* 01/07/2014 1432   RBC 3.73 06/28/2009 1525   HGB 9.0* 07/08/2014 0406   HGB 10.5* 01/07/2014 1432   HGB 10.7* 06/28/2009 1525   HCT 29.1* 07/08/2014 0406   HCT 32.5* 01/07/2014 1432   HCT 33.0* 06/28/2009 1525   PLT 160 07/08/2014 0406   PLT 249 01/07/2014 1432   PLT 272 06/28/2009 1525   MCV 90.9 07/08/2014 0406   MCV 91 01/07/2014 1432   MCV 88.6 06/28/2009 1525   MCH 28.1 07/08/2014 0406   MCH 29.2 01/07/2014 1432   MCH 28.7 06/28/2009 1525   MCHC 30.9 07/08/2014 0406   MCHC 32.3 01/07/2014 1432   MCHC 32.4 06/28/2009 1525   RDW 15.2 07/08/2014 0406   RDW 13.2 01/07/2014 1432   RDW 14.6* 06/28/2009 1525   LYMPHSABS 1.0 01/14/2014 0728   LYMPHSABS 1.0 01/07/2014 1432   LYMPHSABS 1.9 06/28/2009 1525   MONOABS 0.6 01/14/2014 0728   MONOABS 0.5 06/28/2009 1525   EOSABS 0.5 01/14/2014 0728   EOSABS 0.5 01/07/2014 1432   EOSABS 0.3 06/28/2009 1525   BASOSABS 0.1 01/14/2014 0728   BASOSABS 0.1 01/07/2014 1432   BASOSABS 0.1 06/28/2009 1525    BMET    Component Value Date/Time   NA 131* 07/08/2014 0406   NA 145 07/09/2013 1426   K 5.4* 07/08/2014 0406   K 4.4 07/09/2013 1426   CL 103 07/08/2014 0406   CL 103 07/09/2013 1426   CO2 20 07/08/2014 0406   CO2 31 07/09/2013 1426   GLUCOSE 128* 07/08/2014 0406   GLUCOSE 183* 07/09/2013 1426   BUN 52* 07/08/2014 0406   BUN 34* 07/09/2013 1426   CREATININE 2.57* 07/08/2014 0406   CREATININE 1.4* 07/09/2013 1426   CALCIUM 8.1* 07/08/2014 0406   CALCIUM 9.2 07/09/2013 1426   GFRNONAA 16* 07/08/2014 0406   GFRAA 19* 07/08/2014 0406    INR    Component Value Date/Time   INR 1.17 07/02/2014 1553     Intake/Output Summary (Last 24 hours) at 07/08/14 0730 Last data filed at 07/07/14 1630  Gross per 24 hour  Intake    600 ml  Output      0 ml  Net    600 ml     Assessment/Plan:  81 y.o. female is s/p right above knee amputation  2 Days Post-Op  -incision looks good; edema in right stump improved -creatinine up to 2.57 from 2.24 with K+ of 5.4 (up from 5.1 yesterday)-IM following and managing medication changes-will defer to their recommendations. -case management for SNF placement once medically ready (apparently lives in [place] at current time per IM note); spoke with pt about this.  PT recommending SNF.    Leontine Locket, PA-C [facility] [phone] 07/08/2014 7:30 AM   Pt overall appears ok. Incision clean and healing  so far.  Has had some rise in creatinine known prior history of renal dysfunction but worse this admission.  Will defer management to medical team  Begin pursuing SNF so when renal function issues stable can d/c  Ruta Hinds, MD [facility] [place] Office: [phone] Pager: [phone]

## 2014-07-08 NOTE — Progress Notes (Signed)
Inpatient Diabetes Program Recommendations  AACE/ADA: New Consensus Statement on Inpatient Glycemic Control (2013)  Target Ranges:  Prepandial:   less than 140 mg/dL      Peak postprandial:   less than 180 mg/dL (1-2 hours)      Critically ill patients:  140 - 180 mg/dL   Inpatient Diabetes Program Recommendations Insulin - Basal: Pt given 30 units lantus last HS-fasting glucose this am at 99 this am.. May want to decrease to 15-20 units lantus tonight. Correction (SSI): Pt has not needed correction nor meal coverage even without the lantus thus far.  Most probably due to renal insufficiency at this time.   Results for Sandra, [name] (MRN [id number]) as of 07/08/2014 13:04  Ref. Range 07/07/2014 07:54 07/07/2014 11:11 07/07/2014 14:25 07/07/2014 16:10  Glucose-Capillary Latest Ref Range: 70-99 mg/dL 85 107 (H)  114 (H)   07/07/2014 19:59 07/07/2014 22:47 07/08/2014 00:04 07/08/2014 04:06 07/08/2014 04:13 07/08/2014 08:18 07/08/2014 11:16  150 (H) 125 (H) 137 (H)  107 (H) 99 105 (H)   Thank you Rosita Kea, RN, MSN, CDE  Diabetes Inpatient Program Office: [phone] Pager: [phone] 8:00 am to 5:00 pm

## 2014-07-08 NOTE — Progress Notes (Addendum)
[facility] PROGRESS NOTE  Sandra [name] XTK:[id number] DOB: 13-May-1930 DOA: 07/06/2014 PCP:  Melinda Crutch, MD  Assessment/Plan: Active Problems:   DM type 2 causing renal disease   PVD (peripheral vascular disease)   Morbid obesity   Essential hypertension   Hypothyroidism   History of colon cancer   Non-healing wound of lower extremity   Diastolic dysfunction   Acute renal failure    Bilateral lower extremity non healing wounds S/P Right AKA 07/06/2014 Management per primary service.  Acute and chronic CK D stage 3-4 Not currently having dyspnea or SOB.   06/2014 2D echo shows elevated pulmonary artery pressure and elevated end diastolic pressure On demedex 40 mg daily which was discontinued 4/27.  Creatinine has increased  up to 2.57 from 2.24 with K+ of 5.4.  Nephrology consulted Dr. Baird Cancer, likely ischemic ATN , minimal improvement despite IV hydration Hold ARB. Discontinue torsemide 840 mL of urine output,  DM Cbgs stable Continue Lantus and exercise Carb modified diet.  Yeast infection of the Pannus Nystatin Cream WOC recs requested please. Patient denies urinary symptoms  Hypoalbuminemia Likely poor nutrition. Patient lives in [facility] (not SNF) Nutrition consultation.     Hypothyroidism Continue Synthroid.  HTN Currently relatively hypotensive post op. Continue to hold amlodipine and losartan. Patient has prn metoprolol and hydralazine ordered.  Left shoulder pain Patient states she has a torn rotator cuff.  Code Status: full Family Communication: family updated about patient's clinical progress Disposition Plan: Will continue to follow along    Brief narrative: [name] is a 81 year old female who lives in independent living. She presented to the hospital today for planned left AKA. Dr. Scot Dock is admitting her postop. He has asked [place] to consult for general medical management, and primarily diabetes and fluid  management. The patient has a past medical history of chronic diabetic foot ulcers, colon cancer, coronary artery disease, stroke, diabetes mellitus, CKD and hypothyroidism.   Consultants:  Hospitalists    Procedures: Right above-the-knee amputation  Antibiotics: Ciprofloxacin  HPI/Subjective: Very tearful, refusing to take pain medications because of hallucinations during this admission  Objective: Filed Vitals:   07/07/14 1415 07/07/14 2052 07/08/14 0417 07/08/14 1100  BP: 125/52 131/35 138/45   Pulse: 72 65 66   Temp: 98.3 F (36.8 C) 97.7 F (36.5 C) 98.1 F (36.7 C)   TempSrc: Oral Oral Oral   Resp: 18 18 18    Height:    5\' 1"  (1.549 m)  Weight:   99.973 kg (220 lb 6.4 oz) 99.973 kg (220 lb 6.4 oz)  SpO2: 98% 100% 100%     Intake/Output Summary (Last 24 hours) at 07/08/14 1152 Last data filed at 07/08/14 0909  Gross per 24 hour  Intake    840 ml  Output      0 ml  Net    840 ml    Exam:  General: No acute respiratory distress Lungs: Clear to auscultation bilaterally without wheezes or crackles Cardiovascular: Regular rate and rhythm without murmur gallop or rub normal S1 and S2 Abdomen: Nontender, nondistended, soft, bowel sounds positive, no rebound, no ascites, no appreciable mass Extremities: No significant cyanosis, clubbing, Extremities: Can lift stump pretty well       Data Reviewed: Basic Metabolic Panel:  Recent Labs Lab 07/02/14 1553 07/06/14 1425 07/07/14 0514 07/08/14 0406  NA 139 137 134* 131*  K 5.4* 5.1 5.1 5.4*  CL 109 107 104 103  CO2 19 20 21 20   GLUCOSE 88  107* 104* 128*  BUN 51* 47* 48* 52*  CREATININE 1.67* 1.93* 2.24* 2.57*  CALCIUM 8.6 8.3* 8.3* 8.1*    Liver Function Tests:  Recent Labs Lab 07/07/14 0514 07/08/14 0406  AST 21 19  ALT 13 12  ALKPHOS 96 89  BILITOT 0.7 0.5  PROT 6.3 5.8*  ALBUMIN 2.4* 2.2*   No results for input(s): LIPASE, AMYLASE in the last 168 hours. No results for input(s): AMMONIA  in the last 168 hours.  CBC:  Recent Labs Lab 07/02/14 1553 07/06/14 1425 07/07/14 0514 07/08/14 0406  WBC 7.3 7.7 8.9 8.8  HGB 10.3* 9.2* 9.2* 9.0*  HCT 33.0* 28.9* 29.1* 29.1*  MCV 91.2 89.8 90.1 90.9  PLT 163 156 159 160    Cardiac Enzymes: No results for input(s): CKTOTAL, CKMB, CKMBINDEX, TROPONINI in the last 168 hours. BNP (last 3 results)  Recent Labs  07/06/14 1425  BNP 254.3*    ProBNP (last 3 results) No results for input(s): PROBNP in the last 8760 hours.    CBG:  Recent Labs Lab 07/07/14 1959 07/07/14 2247 07/08/14 0004 07/08/14 0413 07/08/14 0818  GLUCAP 150* 125* 137* 107* 99    Recent Results (from the past 240 hour(s))  Surgical pcr screen     Status: Abnormal   Collection Time: 07/02/14  3:53 PM  Result Value Ref Range Status   MRSA, PCR POSITIVE (A) NEGATIVE Final   Staphylococcus aureus POSITIVE (A) NEGATIVE Final    Comment:        The Xpert SA Assay (FDA approved for NASAL specimens in patients over 97 years of age), is one component of a comprehensive surveillance program.  Test performance has been validated by [facility] for patients greater than or equal to 44 year old. It is not intended to diagnose infection nor to guide or monitor treatment.      Studies: Dg Chest 2 View  07/07/2014   CLINICAL DATA:  81 year old female with a history of chest pain and shortness of breath.  EXAM: CHEST - 2 VIEW  COMPARISON:  03/03/2014, 12/11/2013, 03/08/2012  FINDINGS: Cardiomediastinal silhouette unchanged. Atherosclerotic calcifications of the aortic arch.  Airspace opacities at the bilateral lung bases with associated interstitial opacities. Blunting of left costophrenic angle.  Calcifications of the mitral annulus.  Moderate pleural effusion on the lateral view.  No pneumothorax.  No displaced fracture.  Unremarkable appearance of the upper abdomen.  Vague opacities overlying the chest likely external to the patient.  IMPRESSION:  Interval development of bilateral pleural effusions and airspace disease, potentially representing edema and/or pneumonia.  Atherosclerosis and mitral annular calcifications.  Signed,  Dulcy Fanny. Earleen Newport, DO  [facility]  [facility]   Electronically Signed   By: Corrie Mckusick D.O.   On: 07/07/2014 15:33    Scheduled Meds: . aspirin EC  81 mg Oral QPM  . ciprofloxacin  500 mg Oral Q breakfast  . docusate sodium  100 mg Oral Daily  . enoxaparin (LOVENOX) injection  30 mg Subcutaneous Q24H  . feeding supplement (PRO-STAT SUGAR FREE 64)  30 mL Oral BID  . FLUoxetine  20 mg Oral Daily  . insulin aspart  0-9 Units Subcutaneous 6 times per day  . insulin aspart  3 Units Subcutaneous TID WC  . insulin glargine  30 Units Subcutaneous QHS  . levothyroxine  125 mcg Oral QAC breakfast  . nystatin   Topical BID  . pantoprazole  40 mg Oral Daily  . sodium  chloride  3 mL Intravenous Q12H  . Vitamin D (Ergocalciferol)  50,000 Units Oral Daily   Continuous Infusions:    Active Problems:   DM type 2 causing renal disease   PVD (peripheral vascular disease)   Morbid obesity   Essential hypertension   Hypothyroidism   History of colon cancer   Non-healing wound of lower extremity   Diastolic dysfunction   Acute renal failure    Time spent: 40 minutes   [facility] Pager [phone]. If 7PM-7AM, please contact night-coverage at [email or web address], password [facility] 07/08/2014, 11:52 AM  LOS: 2 days

## 2014-07-09 ENCOUNTER — Inpatient Hospital Stay (HOSPITAL_COMMUNITY): Payer: Medicare Other

## 2014-07-09 LAB — COMPREHENSIVE METABOLIC PANEL
ALBUMIN: 2.3 g/dL — AB (ref 3.5–5.2)
ALK PHOS: 94 U/L (ref 39–117)
ALT: 12 U/L (ref 0–35)
ANION GAP: 9 (ref 5–15)
AST: 16 U/L (ref 0–37)
BUN: 59 mg/dL — ABNORMAL HIGH (ref 6–23)
CALCIUM: 8.1 mg/dL — AB (ref 8.4–10.5)
CHLORIDE: 102 mmol/L (ref 96–112)
CO2: 20 mmol/L (ref 19–32)
Creatinine, Ser: 3.08 mg/dL — ABNORMAL HIGH (ref 0.50–1.10)
GFR calc Af Amer: 15 mL/min — ABNORMAL LOW (ref >90)
GFR calc non Af Amer: 13 mL/min — ABNORMAL LOW (ref >90)
Glucose, Bld: 123 mg/dL — ABNORMAL HIGH (ref 70–99)
Potassium: 5.6 mmol/L — ABNORMAL HIGH (ref 3.5–5.1)
Sodium: 131 mmol/L — ABNORMAL LOW (ref 135–145)
TOTAL PROTEIN: 6.2 g/dL (ref 6.0–8.3)
Total Bilirubin: 0.4 mg/dL (ref 0.3–1.2)

## 2014-07-09 LAB — CBC
HCT: 28.1 % — ABNORMAL LOW (ref 36.0–46.0)
Hemoglobin: 8.9 g/dL — ABNORMAL LOW (ref 12.0–15.0)
MCH: 28.5 pg (ref 26.0–34.0)
MCHC: 31.7 g/dL (ref 30.0–36.0)
MCV: 90.1 fL (ref 78.0–100.0)
Platelets: 171 10*3/uL (ref 150–400)
RBC: 3.12 MIL/uL — ABNORMAL LOW (ref 3.87–5.11)
RDW: 15.1 % (ref 11.5–15.5)
WBC: 7.2 10*3/uL (ref 4.0–10.5)

## 2014-07-09 LAB — GLUCOSE, CAPILLARY
GLUCOSE-CAPILLARY: 144 mg/dL — AB (ref 70–99)
Glucose-Capillary: 116 mg/dL — ABNORMAL HIGH (ref 70–99)
Glucose-Capillary: 113 mg/dL — ABNORMAL HIGH (ref 70–99)

## 2014-07-09 MED ORDER — SODIUM POLYSTYRENE SULFONATE 15 GM/60ML PO SUSP
30.0000 g | Freq: Once | ORAL | Status: AC
Start: 2014-07-09 — End: 2014-07-09
  Administered 2014-07-09: 30 g via ORAL
  Filled 2014-07-09: qty 120

## 2014-07-09 NOTE — Progress Notes (Signed)
Utilization review completed.  

## 2014-07-09 NOTE — Progress Notes (Signed)
Admit: 07/06/2014 LOS: 3  81F with AoCKD (BL SCr 1.4-1.6) s/p R AKA 07/06/14 in setting of morbid obesity, DM2  Subjective:  Renal ultrasound overnight without any major structural exhalations for renal failure Unable to record I/Os No shortness of breath. Good appetite. No nausea or vomiting.   04/28 0701 - 04/29 0700 In: 240 [P.O.:240] Out: -   Filed Weights   07/07/14 0443 07/08/14 0417 07/08/14 1100  Weight: 99.338 kg (219 lb) 99.973 kg (220 lb 6.4 oz) 99.973 kg (220 lb 6.4 oz)    Scheduled Meds: . aspirin EC  81 mg Oral QPM  . ciprofloxacin  500 mg Oral Q breakfast  . docusate sodium  100 mg Oral Daily  . enoxaparin (LOVENOX) injection  30 mg Subcutaneous Q24H  . feeding supplement (PRO-STAT SUGAR FREE 64)  30 mL Oral BID  . FLUoxetine  20 mg Oral Daily  . insulin aspart  0-9 Units Subcutaneous 6 times per day  . insulin aspart  3 Units Subcutaneous TID WC  . insulin glargine  30 Units Subcutaneous QHS  . levothyroxine  125 mcg Oral QAC breakfast  . nystatin   Topical BID  . pantoprazole  40 mg Oral Daily  . sodium chloride  3 mL Intravenous Q12H  . Vitamin D (Ergocalciferol)  50,000 Units Oral Daily   Continuous Infusions:  PRN Meds:.sodium chloride, acetaminophen **OR** acetaminophen, bisacodyl, guaiFENesin-dextromethorphan, hydrALAZINE, labetalol, metoprolol, morphine injection, ondansetron, oxyCODONE, phenol, potassium chloride, senna-docusate, sodium chloride  Current Labs: reviewed    Physical Exam:  Blood pressure 151/47, pulse 66, temperature 98.3 F (36.8 C), temperature source Oral, resp. rate 18, height 5\' 1"  (1.549 m), weight 99.973 kg (220 lb 6.4 oz), SpO2 99 %. GEN: Morbidly obese female lying in bed ENT: Poor dentition, NCAT EYES: EOMI CV: RRR, normal S1 and S2 PULM: Clear bilaterally, limited ABD: Soft, nontender, no bruits SKIN: No rashes or lesions, surgical stump is clean dry and intact EXT: Has pitting edema in the posterior portions of the  thighs as well as in the posterior sacral area  A/P 1. AoCKD 1. BL SCr 1.4-1.6 2. Sees ?[facility] at [facility] 3. Suspect related to volume shifts in perioperative period, possible ATN 4. WOuld have expected decreased SCr post amputation 5. Renal ultrasound on 07/09/14 with no structural explanations 6. UA ordered 7. Hold IVFs given edema, but don't restart diuretics; let eat and follow closely 8. Daily weights, Daily Renal Panel, Strict I/Os, Avoid nephrotoxins (NSAIDs, judicious IV Contrast) 2. Hyperkalemia, mild 1. Would not give Kayexalate 2. Continue to monitor 3. S/p R AKA for PVD and nonhealing leg wounds; appears to be doing well 4. Morbid Obesity 5. Hypervolemia / Edema; As per #1 6. DM2 7. HTN  Pearson Grippe MD 07/09/2014, 9:59 AM   Recent Labs Lab 07/07/14 0514 07/08/14 0406 07/09/14 0555  NA 134* 131* 131*  K 5.1 5.4* 5.6*  CL 104 103 102  CO2 21 20 20   GLUCOSE 104* 128* 123*  BUN 48* 52* 59*  CREATININE 2.24* 2.57* 3.08*  CALCIUM 8.3* 8.1* 8.1*    Recent Labs Lab 07/07/14 0514 07/08/14 0406 07/09/14 0555  WBC 8.9 8.8 7.2  HGB 9.2* 9.0* 8.9*  HCT 29.1* 29.1* 28.1*  MCV 90.1 90.9 90.1  PLT 159 160 171

## 2014-07-09 NOTE — Progress Notes (Signed)
Medicare Important Message given? YES  (If response is "NO", the following Medicare IM given date fields will be blank)  Date Medicare IM given: 07/09/14 Medicare IM given by:  Dahlia Client [employer]

## 2014-07-09 NOTE — Clinical Documentation Improvement (Signed)
  MD's, NP's, and PA's  Noted documentation of "diastolic dysfunction"  with diagnosis of 'Fluid Overload" , please provide more specific diagnosis if appropriate for this admsission.  Thank you  Possible Clinical Conditions?   Chronic Diastolic Congestive Heart Failure Chronic Systolic & Diastolic Congestive Heart Failure  Acute Diastolic Congestive Heart Failure Acute Systolic & Diastolic Congestive Heart Failure  Acute on Chronic Diastolic Congestive Heart Failure Acute on Chronic Systolic & Diastolic Congestive Heart Failure Other Condition Cannot Clinically Determine    Risk Factors:HTN, ATN, DM type 2, Fluid Overload  Diagnostics:  Chest X ray : IMPRESSION: Interval development of bilateral pleural effusions and airspace disease, potentially representing edema and/or pneumonia  Treatment:Held IV fluids   Thank You, Ree Kida ,RN Clinical Documentation Specialist:  [phone]  [place] [employer]

## 2014-07-09 NOTE — Progress Notes (Signed)
ANTIBIOTIC CONSULT NOTE - Follow up  Pharmacy Consult:  Pharmacy to renally adjust antibiotics   Allergies  Allergen Reactions  . Blackberry [Rubus Fruticosus]     Can't take because of Cancer  . Meloxicam Shortness Of Breath  . Procaine Hcl Swelling  . Alendronate Sodium Other (See Comments)    constipation  . Meloxicam     Patient Measurements: Height: 5\' 1"  (154.9 cm) Weight: 220 lb 6.4 oz (99.973 kg) IBW/kg (Calculated) : 47.8  Vital Signs: Temp: 98.3 F (36.8 C) (04/29 0538) Temp Source: Oral (04/29 0538) BP: 151/47 mmHg (04/29 0538) Pulse Rate: 66 (04/29 0538) Intake/Output from this shift:    Labs:  Recent Labs  07/07/14 0514 07/08/14 0406 07/09/14 0555  WBC 8.9 8.8 7.2  HGB 9.2* 9.0* 8.9*  PLT 159 160 171  CREATININE 2.24* 2.57* 3.08*   Estimated Creatinine Clearance: 15.3 mL/min (by C-G formula based on Cr of 3.08). No results for input(s): VANCOTROUGH, VANCOPEAK, VANCORANDOM, GENTTROUGH, GENTPEAK, GENTRANDOM, TOBRATROUGH, TOBRAPEAK, TOBRARND, AMIKACINPEAK, AMIKACINTROU, AMIKACIN in the last 72 hours.   Microbiology: Recent Results (from the past 720 hour(s))  Surgical pcr screen     Status: Abnormal   Collection Time: 07/02/14  3:53 PM  Result Value Ref Range Status   MRSA, PCR POSITIVE (A) NEGATIVE Final   Staphylococcus aureus POSITIVE (A) NEGATIVE Final    Comment:        The Xpert SA Assay (FDA approved for NASAL specimens in patients over 79 years of age), is one component of a comprehensive surveillance program.  Test performance has been validated by [facility] for patients greater than or equal to 79 year old. It is not intended to diagnose infection nor to guide or monitor treatment.     Medical History: Past Medical History  Diagnosis Date  . Anemia   . CAD (coronary artery disease)   . Peripheral vascular disease   . Hypertension   . Cancer     colon; s/p colectomy 2007  . Anemia, iron deficiency 01/17/2012  .  Hemorrhoids   . Nosebleed   . Stroke   . Cellulitis 03/08/12  . Diabetes mellitus     IDDM ; Type 2 for 54 years  . Hypothyroidism     since childhood  . Shortness of breath dyspnea   . Depression   . Wound infection     on right foot   . Diabetic foot ulcers   . RBBB (right bundle branch block with left anterior fascicular block)   . Chronic kidney disease   . Edema of both legs     Assessment: 81 YOF with non-healing LE wounds to continue on Cipro from PTA.  Patient with reduced renal clearance. Pharmacy consulted to adjust antibiotics for renal dysfunction  ID: Cipro from PTA for LE wounds, no bypass d/t high risk and proceeded with conservative tx; afeb, wbc wnl  4/26>>cipro  No micro results  Goal of Therapy:  Resolution of infection  Plan:  - Continue Cipro to 500mg  PO daily, dose remains appropriate. - Follow up SCr, UOP, cultures, clinical course and adjust as clinically indicated.  Thank you for allowing pharmacy to be a part of this patients care team.  Rowe Robert Pharm.D., BCPS, AQ-Cardiology Clinical Pharmacist 07/09/2014 11:23 AM Pager: [phone] Phone: [phone]

## 2014-07-09 NOTE — Progress Notes (Addendum)
Progress Note    07/09/2014 8:02 AM 3 Days Post-Op  Subjective:  Out of room  Afebrile VSS   Filed Vitals:   07/09/14 0538  BP: 151/47  Pulse: 66  Temp: 98.3 F (36.8 C)  Resp: 18    CBC    Component Value Date/Time   WBC 7.2 07/09/2014 0555   WBC 10.5* 01/07/2014 1432   WBC 9.8 06/28/2009 1525   RBC 3.12* 07/09/2014 0555   RBC 3.59* 01/07/2014 1432   RBC 3.60* 01/07/2014 1432   RBC 3.73 06/28/2009 1525   HGB 8.9* 07/09/2014 0555   HGB 10.5* 01/07/2014 1432   HGB 10.7* 06/28/2009 1525   HCT 28.1* 07/09/2014 0555   HCT 32.5* 01/07/2014 1432   HCT 33.0* 06/28/2009 1525   PLT 171 07/09/2014 0555   PLT 249 01/07/2014 1432   PLT 272 06/28/2009 1525   MCV 90.1 07/09/2014 0555   MCV 91 01/07/2014 1432   MCV 88.6 06/28/2009 1525   MCH 28.5 07/09/2014 0555   MCH 29.2 01/07/2014 1432   MCH 28.7 06/28/2009 1525   MCHC 31.7 07/09/2014 0555   MCHC 32.3 01/07/2014 1432   MCHC 32.4 06/28/2009 1525   RDW 15.1 07/09/2014 0555   RDW 13.2 01/07/2014 1432   RDW 14.6* 06/28/2009 1525   LYMPHSABS 1.0 01/14/2014 0728   LYMPHSABS 1.0 01/07/2014 1432   LYMPHSABS 1.9 06/28/2009 1525   MONOABS 0.6 01/14/2014 0728   MONOABS 0.5 06/28/2009 1525   EOSABS 0.5 01/14/2014 0728   EOSABS 0.5 01/07/2014 1432   EOSABS 0.3 06/28/2009 1525   BASOSABS 0.1 01/14/2014 0728   BASOSABS 0.1 01/07/2014 1432   BASOSABS 0.1 06/28/2009 1525    BMET    Component Value Date/Time   NA 131* 07/09/2014 0555   NA 145 07/09/2013 1426   K 5.6* 07/09/2014 0555   K 4.4 07/09/2013 1426   CL 102 07/09/2014 0555   CL 103 07/09/2013 1426   CO2 20 07/09/2014 0555   CO2 31 07/09/2013 1426   GLUCOSE 123* 07/09/2014 0555   GLUCOSE 183* 07/09/2013 1426   BUN 59* 07/09/2014 0555   BUN 34* 07/09/2013 1426   CREATININE 3.08* 07/09/2014 0555   CREATININE 1.4* 07/09/2013 1426   CALCIUM 8.1* 07/09/2014 0555   CALCIUM 9.2 07/09/2013 1426   GFRNONAA 13* 07/09/2014 0555   GFRAA 15* 07/09/2014 0555     INR    Component Value Date/Time   INR 1.17 07/02/2014 1553     Intake/Output Summary (Last 24 hours) at 07/09/14 0802 Last data filed at 07/08/14 0909  Gross per 24 hour  Intake    240 ml  Output      0 ml  Net    240 ml     Assessment/Plan:  81 y.o. female is s/p right above knee amputation  3 Days Post-Op  -pt out of room at this time-will return to evaluate her wound later this morning. -pt creatinine continues to rise and potassium up slightly from yesterday to 5.6-nephrology consult obtained by IM yesterday -hgb stable -retention sock ordered    Leontine Locket, PA-C [facility] [phone] 07/09/2014 8:02 AM   Addendum:  -Pt back in room.  Pt was having renal u/s this am. -Stump with serous drainage on blanket.  Incision looks good with staples in tact.   -pain controlled and stump viable. -will ask WOC to evaluate pt's left leg diabetic ulcers/dressing changes. -pt most likely will not be discharged today due to rising creatinine,  but will re-consult social work for SNF placement when pt medically ready  RHYNE, [facility] 07/09/2014 9:02 AM   Agree with above.  Renal dysfunction currently is biggest issue. Continue to watch right AKA incision Will d/c Cipro since no obvious ongoing infection  Ruta Hinds, MD [facility] [place] Office: [phone] Pager: [phone]

## 2014-07-09 NOTE — Progress Notes (Addendum)
[facility] PROGRESS NOTE  Sandra [name] MBW:[id number] DOB: 04-16-1930 DOA: 07/06/2014 PCP:  Melinda Crutch, MD  Assessment/Plan: Active Problems:   DM type 2 causing renal disease   PVD (peripheral vascular disease)   Morbid obesity   Essential hypertension   Hypothyroidism   History of colon cancer   Non-healing wound of lower extremity   Diastolic dysfunction   Acute renal failure    Bilateral lower extremity non healing wounds S/P Right AKA 07/06/2014 Management per primary service.  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  Received Kayexalate for potassium of 5.4  on 4/28 Repeat Kayexalate today  potassium is 5.6 Nephrology consulted Dr. Baird Cancer, likely ischemic ATN , minimal improvement despite IV hydration Hold ARB. Discontinue torsemide Renal ultrasound on 07/09/14 with no structural explanations Hold IVFs given edema, per nephrology   DM Cbgs stable Continue Lantus and exercise Carb modified diet.  Yeast infection of the Pannus Nystatin Cream WOC recs requested please. Patient denies urinary symptoms  Hypoalbuminemia Likely poor nutrition. Patient lives in [place] (not SNF) Nutrition consultation.   Hypothyroidism Continue Synthroid.  HTN Currently relatively hypotensive post op. Improving Continue to hold amlodipine and losartan. Patient has prn metoprolol and hydralazine ordered.  Left shoulder pain Patient states she has a torn rotator cuff.  Code Status: full Family Communication: family updated about patient's clinical progress Disposition Plan: Plan per nephrology   Brief narrative: [name] is a 81 year old female who lives in independent living. She presented to the hospital today for planned left AKA. Dr. Scot Dock is  admitting her postop. He has asked [place] to consult for general medical management, and primarily diabetes and fluid management. The patient has a past medical history of chronic diabetic foot ulcers, colon cancer, coronary artery disease, stroke, diabetes mellitus, CKD and hypothyroidism.   Consultants:  Hospitalists    Procedures: Right above-the-knee amputation  Antibiotics: Ciprofloxacin  HPI/Subjective: Renal ultrasound overnight without any major structural exhalations for renal failure Unable to record I/Os  Objective: Filed Vitals:   07/08/14 1100 07/08/14 1420 07/08/14 2011 07/09/14 0538  BP:  116/37 133/50 151/47  Pulse:  66 64 66  Temp:  98.7 F (37.1 C) 98 F (36.7 C) 98.3 F (36.8 C)  TempSrc:  Oral Oral Oral  Resp:  17 18 18   Height: 5\' 1"  (1.549 m)     Weight: 99.973 kg (220 lb 6.4 oz)     SpO2:  96% 100% 99%   No intake or output data in the 24 hours ending 07/09/14 1035  Exam:  General: No acute respiratory distress Lungs: Clear to auscultation bilaterally without wheezes or crackles Cardiovascular: Regular rate and rhythm without murmur gallop or rub normal S1 and S2 Abdomen: Nontender, nondistended, soft, bowel sounds positive, no rebound, no ascites, no appreciable mass Extremities: No significant cyanosis, clubbing, Extremities: Can lift stump pretty well       Data Reviewed: Basic Metabolic Panel:  Recent Labs Lab 07/02/14 1553 07/06/14 1425 07/07/14 0514 07/08/14 0406 07/09/14 0555  NA 139 137 134* 131* 131*  K 5.4* 5.1 5.1 5.4* 5.6*  CL 109 107 104 103 102  CO2 19 20 21 20 20   GLUCOSE 88 107* 104* 128* 123*  BUN 51* 47* 48* 52* 59*  CREATININE 1.67* 1.93* 2.24* 2.57* 3.08*  CALCIUM  8.6 8.3* 8.3* 8.1* 8.1*    Liver Function Tests:  Recent Labs Lab 07/07/14 0514 07/08/14 0406 07/09/14 0555  AST 21 19 16   ALT 13 12 12   ALKPHOS 96 89 94  BILITOT 0.7 0.5 0.4  PROT 6.3 5.8* 6.2  ALBUMIN 2.4* 2.2* 2.3*   No results for  input(s): LIPASE, AMYLASE in the last 168 hours. No results for input(s): AMMONIA in the last 168 hours.  CBC:  Recent Labs Lab 07/02/14 1553 07/06/14 1425 07/07/14 0514 07/08/14 0406 07/09/14 0555  WBC 7.3 7.7 8.9 8.8 7.2  HGB 10.3* 9.2* 9.2* 9.0* 8.9*  HCT 33.0* 28.9* 29.1* 29.1* 28.1*  MCV 91.2 89.8 90.1 90.9 90.1  PLT 163 156 159 160 171    Cardiac Enzymes: No results for input(s): CKTOTAL, CKMB, CKMBINDEX, TROPONINI in the last 168 hours. BNP (last 3 results)  Recent Labs  07/06/14 1425  BNP 254.3*    ProBNP (last 3 results) No results for input(s): PROBNP in the last 8760 hours.    CBG:  Recent Labs Lab 07/08/14 0818 07/08/14 1116 07/08/14 1629 07/08/14 2010 07/09/14 0535  GLUCAP 99 105* 115* 121* 116*    Recent Results (from the past 240 hour(s))  Surgical pcr screen     Status: Abnormal   Collection Time: 07/02/14  3:53 PM  Result Value Ref Range Status   MRSA, PCR POSITIVE (A) NEGATIVE Final   Staphylococcus aureus POSITIVE (A) NEGATIVE Final    Comment:        The Xpert SA Assay (FDA approved for NASAL specimens in patients over 59 years of age), is one component of a comprehensive surveillance program.  Test performance has been validated by [facility] for patients greater than or equal to 61 year old. It is not intended to diagnose infection nor to guide or monitor treatment.      Studies: Dg Chest 2 View  07/07/2014   CLINICAL DATA:  82 year old female with a history of chest pain and shortness of breath.  EXAM: CHEST - 2 VIEW  COMPARISON:  03/03/2014, 12/11/2013, 03/08/2012  FINDINGS: Cardiomediastinal silhouette unchanged. Atherosclerotic calcifications of the aortic arch.  Airspace opacities at the bilateral lung bases with associated interstitial opacities. Blunting of left costophrenic angle.  Calcifications of the mitral annulus.  Moderate pleural effusion on the lateral view.  No pneumothorax.  No displaced fracture.   Unremarkable appearance of the upper abdomen.  Vague opacities overlying the chest likely external to the patient.  IMPRESSION: Interval development of bilateral pleural effusions and airspace disease, potentially representing edema and/or pneumonia.  Atherosclerosis and mitral annular calcifications.  Signed,  Dulcy Fanny. Earleen Newport, DO  [facility]  [facility]   Electronically Signed   By: Corrie Mckusick D.O.   On: 07/07/2014 15:33   US Renal  07/09/2014   CLINICAL DATA:  Acute kidney injury  EXAM: RENAL / URINARY TRACT ULTRASOUND COMPLETE  COMPARISON:  09/14/2013  FINDINGS: Right Kidney:  Length: 11 cm. Increased cortical echogenicity diffusely with borderline cortical thinning. Two simple appearing cysts present, 10 mm in the upper pole and 12 mm in the interpolar region.  Left Kidney:  Limited visualization due to narrow sonographic windows.  Length: 11 cm.  No hydronephrosis or gross mass lesion.  Bladder:  Moderately distended.  No wall thickening.  Left pleural effusion and small left upper quadrant ascites.  IMPRESSION: 1. No hydronephrosis. 2. Medical renal disease. 3. Limited visualization of the left kidney. 4. Left pleural effusion  and small ascites.   Electronically Signed   By: Monte Fantasia M.D.   On: 07/09/2014 09:33    Scheduled Meds: . aspirin EC  81 mg Oral QPM  . ciprofloxacin  500 mg Oral Q breakfast  . docusate sodium  100 mg Oral Daily  . enoxaparin (LOVENOX) injection  30 mg Subcutaneous Q24H  . feeding supplement (PRO-STAT SUGAR FREE 64)  30 mL Oral BID  . FLUoxetine  20 mg Oral Daily  . insulin aspart  0-9 Units Subcutaneous 6 times per day  . insulin aspart  3 Units Subcutaneous TID WC  . insulin glargine  30 Units Subcutaneous QHS  . levothyroxine  125 mcg Oral QAC breakfast  . nystatin   Topical BID  . pantoprazole  40 mg Oral Daily  . sodium chloride  3 mL Intravenous Q12H  . sodium polystyrene  30 g Oral Once  . Vitamin D  (Ergocalciferol)  50,000 Units Oral Daily   Continuous Infusions:    Active Problems:   DM type 2 causing renal disease   PVD (peripheral vascular disease)   Morbid obesity   Essential hypertension   Hypothyroidism   History of colon cancer   Non-healing wound of lower extremity   Diastolic dysfunction   Acute renal failure    Time spent: 40 minutes   [facility] Pager [phone]. If 7PM-7AM, please contact night-coverage at [email or web address], password [facility] 07/09/2014, 10:35 AM  LOS: 3 days

## 2014-07-10 DIAGNOSIS — N179 Acute kidney failure, unspecified: Secondary | ICD-10-CM | POA: Insufficient documentation

## 2014-07-10 DIAGNOSIS — K5900 Constipation, unspecified: Secondary | ICD-10-CM | POA: Diagnosis present

## 2014-07-10 DIAGNOSIS — K5909 Other constipation: Secondary | ICD-10-CM

## 2014-07-10 LAB — CBC
HCT: 28.5 % — ABNORMAL LOW (ref 36.0–46.0)
Hemoglobin: 9 g/dL — ABNORMAL LOW (ref 12.0–15.0)
MCH: 28.2 pg (ref 26.0–34.0)
MCHC: 31.6 g/dL (ref 30.0–36.0)
MCV: 89.3 fL (ref 78.0–100.0)
PLATELETS: 173 10*3/uL (ref 150–400)
RBC: 3.19 MIL/uL — AB (ref 3.87–5.11)
RDW: 14.9 % (ref 11.5–15.5)
WBC: 5.9 10*3/uL (ref 4.0–10.5)

## 2014-07-10 LAB — COMPREHENSIVE METABOLIC PANEL
ALK PHOS: 100 U/L (ref 39–117)
ALT: 11 U/L (ref 0–35)
AST: 15 U/L (ref 0–37)
Albumin: 2.2 g/dL — ABNORMAL LOW (ref 3.5–5.2)
Anion gap: 8 (ref 5–15)
BILIRUBIN TOTAL: 0.4 mg/dL (ref 0.3–1.2)
BUN: 65 mg/dL — AB (ref 6–23)
CHLORIDE: 102 mmol/L (ref 96–112)
CO2: 22 mmol/L (ref 19–32)
Calcium: 8.2 mg/dL — ABNORMAL LOW (ref 8.4–10.5)
Creatinine, Ser: 2.62 mg/dL — ABNORMAL HIGH (ref 0.50–1.10)
GFR calc Af Amer: 18 mL/min — ABNORMAL LOW (ref >90)
GFR, EST NON AFRICAN AMERICAN: 16 mL/min — AB (ref >90)
Glucose, Bld: 121 mg/dL — ABNORMAL HIGH (ref 70–99)
POTASSIUM: 4.9 mmol/L (ref 3.5–5.1)
Sodium: 132 mmol/L — ABNORMAL LOW (ref 135–145)
Total Protein: 6.1 g/dL (ref 6.0–8.3)

## 2014-07-10 LAB — GLUCOSE, CAPILLARY
GLUCOSE-CAPILLARY: 109 mg/dL — AB (ref 70–99)
GLUCOSE-CAPILLARY: 109 mg/dL — AB (ref 70–99)
GLUCOSE-CAPILLARY: 226 mg/dL — AB (ref 70–99)
GLUCOSE-CAPILLARY: 94 mg/dL (ref 70–99)
GLUCOSE-CAPILLARY: 113 mg/dL — AB (ref 70–99)
Glucose-Capillary: 111 mg/dL — ABNORMAL HIGH (ref 70–99)
Glucose-Capillary: 135 mg/dL — ABNORMAL HIGH (ref 70–99)

## 2014-07-10 MED ORDER — SENNOSIDES-DOCUSATE SODIUM 8.6-50 MG PO TABS
2.0000 | ORAL_TABLET | Freq: Every day | ORAL | Status: DC
  Administered 2014-07-10 – 2014-07-13 (×3): 2 via ORAL
  Filled 2014-07-10 (×5): qty 2

## 2014-07-10 MED ORDER — INSULIN ASPART 100 UNIT/ML ~~LOC~~ SOLN
0.0000 [IU] | Freq: Three times a day (TID) | SUBCUTANEOUS | Status: DC
  Administered 2014-07-12: 1 [IU] via SUBCUTANEOUS
  Administered 2014-07-12 – 2014-07-13 (×2): 2 [IU] via SUBCUTANEOUS
  Administered 2014-07-13: 1 [IU] via SUBCUTANEOUS
  Administered 2014-07-14: 2 [IU] via SUBCUTANEOUS

## 2014-07-10 NOTE — Progress Notes (Signed)
[facility] PROGRESS NOTE  Sandra [name] SFK:[id number] DOB: 09/27/1930 DOA: 07/06/2014 PCP:  Melinda Crutch, MD  Assessment/Plan: Active Problems:   DM type 2 causing renal disease   PVD (peripheral vascular disease)   Morbid obesity   Essential hypertension   Hypothyroidism   History of colon cancer   Non-healing wound of lower extremity   Diastolic dysfunction   Acute renal failure   Acute and chronic CK D stage 3-4 Nephrology managing Potassium normal. Can d/c tele.  DM 2 with renal complications. Stable. Change CBG and SSI to qac  Hypothyroidism Continue Synthroid.  HTN No longer hypotensive  Constipation: change sennekot to qhs  Antibiotics: Ciprofloxacin  HPI/Subjective: Pain controlled. Constipated. Appetite good. No dyspnea.  Objective: Filed Vitals:   07/09/14 0538 07/09/14 1950 07/10/14 0400 07/10/14 1454  BP: 151/47 161/46 157/51 147/52  Pulse: 66 68 62 67  Temp: 98.3 F (36.8 C) 97.6 F (36.4 C) 97.4 F (36.3 C) 98 F (36.7 C)  TempSrc: Oral Oral Oral Oral  Resp: 18 20 20 18   Height:      Weight:   101.061 kg (222 lb 12.8 oz)   SpO2: 99% 98% 98% 98%    Intake/Output Summary (Last 24 hours) at 07/10/14 1755 Last data filed at 07/10/14 1732  Gross per 24 hour  Intake    720 ml  Output    400 ml  Net    320 ml    Exam:  General: No acute respiratory distress Lungs: Clear to auscultation bilaterally without wheezes or crackles Cardiovascular: Regular rate and rhythm without murmur gallop or rub normal S1 and S2 Abdomen: Nontender, nondistended, soft, bowel sounds positive, no rebound, no ascites, no appreciable mass Extremities: No significant cyanosis, clubbing, Extremities: Can lift stump pretty well       Data Reviewed: Basic Metabolic Panel:  Recent Labs Lab 07/06/14 1425 07/07/14 0514 07/08/14 0406 07/09/14 0555 07/10/14 0413  NA 137 134* 131* 131* 132*  K 5.1 5.1 5.4* 5.6* 4.9  CL 107 104 103 102 102  CO2 20 21  20 20 22   GLUCOSE 107* 104* 128* 123* 121*  BUN 47* 48* 52* 59* 65*  CREATININE 1.93* 2.24* 2.57* 3.08* 2.62*  CALCIUM 8.3* 8.3* 8.1* 8.1* 8.2*    Liver Function Tests:  Recent Labs Lab 07/07/14 0514 07/08/14 0406 07/09/14 0555 07/10/14 0413  AST 21 19 16 15   ALT 13 12 12 11   ALKPHOS 96 89 94 100  BILITOT 0.7 0.5 0.4 0.4  PROT 6.3 5.8* 6.2 6.1  ALBUMIN 2.4* 2.2* 2.3* 2.2*   No results for input(s): LIPASE, AMYLASE in the last 168 hours. No results for input(s): AMMONIA in the last 168 hours.  CBC:  Recent Labs Lab 07/06/14 1425 07/07/14 0514 07/08/14 0406 07/09/14 0555 07/10/14 0413  WBC 7.7 8.9 8.8 7.2 5.9  HGB 9.2* 9.2* 9.0* 8.9* 9.0*  HCT 28.9* 29.1* 29.1* 28.1* 28.5*  MCV 89.8 90.1 90.9 90.1 89.3  PLT 156 159 160 171 173    Cardiac Enzymes: No results for input(s): CKTOTAL, CKMB, CKMBINDEX, TROPONINI in the last 168 hours. BNP (last 3 results)  Recent Labs  07/06/14 1425  BNP 254.3*    ProBNP (last 3 results) No results for input(s): PROBNP in the last 8760 hours.    CBG:  Recent Labs Lab 07/10/14 0006 07/10/14 0359 07/10/14 0804 07/10/14 1112 07/10/14 1614  GLUCAP 135* 109* 226* 94 111*    Recent Results (from the past 240 hour(s))  Surgical  pcr screen     Status: Abnormal   Collection Time: 07/02/14  3:53 PM  Result Value Ref Range Status   MRSA, PCR POSITIVE (A) NEGATIVE Final   Staphylococcus aureus POSITIVE (A) NEGATIVE Final    Comment:        The Xpert SA Assay (FDA approved for NASAL specimens in patients over 40 years of age), is one component of a comprehensive surveillance program.  Test performance has been validated by [facility] for patients greater than or equal to 11 year old. It is not intended to diagnose infection nor to guide or monitor treatment.      Studies: Dg Chest 2 View  07/07/2014   CLINICAL DATA:  81 year old female with a history of chest pain and shortness of breath.  EXAM: CHEST - 2 VIEW   COMPARISON:  03/03/2014, 12/11/2013, 03/08/2012  FINDINGS: Cardiomediastinal silhouette unchanged. Atherosclerotic calcifications of the aortic arch.  Airspace opacities at the bilateral lung bases with associated interstitial opacities. Blunting of left costophrenic angle.  Calcifications of the mitral annulus.  Moderate pleural effusion on the lateral view.  No pneumothorax.  No displaced fracture.  Unremarkable appearance of the upper abdomen.  Vague opacities overlying the chest likely external to the patient.  IMPRESSION: Interval development of bilateral pleural effusions and airspace disease, potentially representing edema and/or pneumonia.  Atherosclerosis and mitral annular calcifications.  Signed,  Dulcy Fanny. Earleen Newport, DO  [facility]  [facility]   Electronically Signed   By: Corrie Mckusick D.O.   On: 07/07/2014 15:33   US Renal  07/09/2014   CLINICAL DATA:  Acute kidney injury  EXAM: RENAL / URINARY TRACT ULTRASOUND COMPLETE  COMPARISON:  09/14/2013  FINDINGS: Right Kidney:  Length: 11 cm. Increased cortical echogenicity diffusely with borderline cortical thinning. Two simple appearing cysts present, 10 mm in the upper pole and 12 mm in the interpolar region.  Left Kidney:  Limited visualization due to narrow sonographic windows.  Length: 11 cm.  No hydronephrosis or gross mass lesion.  Bladder:  Moderately distended.  No wall thickening.  Left pleural effusion and small left upper quadrant ascites.  IMPRESSION: 1. No hydronephrosis. 2. Medical renal disease. 3. Limited visualization of the left kidney. 4. Left pleural effusion and small ascites.   Electronically Signed   By: Monte Fantasia M.D.   On: 07/09/2014 09:33    Scheduled Meds: . aspirin EC  81 mg Oral QPM  . docusate sodium  100 mg Oral Daily  . enoxaparin (LOVENOX) injection  30 mg Subcutaneous Q24H  . feeding supplement (PRO-STAT SUGAR FREE 64)  30 mL Oral BID  . FLUoxetine  20 mg Oral  Daily  . insulin aspart  0-9 Units Subcutaneous 6 times per day  . insulin aspart  3 Units Subcutaneous TID WC  . insulin glargine  30 Units Subcutaneous QHS  . levothyroxine  125 mcg Oral QAC breakfast  . nystatin   Topical BID  . pantoprazole  40 mg Oral Daily  . senna-docusate  2 tablet Oral QHS  . sodium chloride  3 mL Intravenous Q12H  . Vitamin D (Ergocalciferol)  50,000 Units Oral Daily   Continuous Infusions:   Time spent: 20 minutes  [facility] Pager [phone]. If 7PM-7AM, please contact night-coverage at [email or web address], password [facility] 07/10/2014, 5:55 PM  LOS: 4 days

## 2014-07-10 NOTE — Progress Notes (Signed)
[facility] [place]  Subjective  - uncomfortable in bed   Objective 157/51 62 97.4 F (36.3 C) (Oral) 20 98%  Intake/Output Summary (Last 24 hours) at 07/10/14 0858 Last data filed at 07/09/14 1745  Gross per 24 hour  Intake    360 ml  Output      0 ml  Net    360 ml   Aka serous drainage otherwise healing  Assessment/Planning: Renal function somewhat improved today continue to follow AKA dry dressing SNF when renal function stable  Felecia Stanfill E 07/10/2014 8:58 AM --  Laboratory Lab Results:  Recent Labs  07/09/14 0555 07/10/14 0413  WBC 7.2 5.9  HGB 8.9* 9.0*  HCT 28.1* 28.5*  PLT 171 173   BMET  Recent Labs  07/09/14 0555 07/10/14 0413  NA 131* 132*  K 5.6* 4.9  CL 102 102  CO2 20 22  GLUCOSE 123* 121*  BUN 59* 65*  CREATININE 3.08* 2.62*  CALCIUM 8.1* 8.2*    COAG Lab Results  Component Value Date   INR 1.17 07/02/2014   No results found for: PTT

## 2014-07-10 NOTE — Progress Notes (Signed)
Admit: 07/06/2014 LOS: 4  63F with AoCKD (BL SCr 1.4-1.6) s/p R AKA 07/06/14 in setting of morbid obesity, DM2  Subjective:  Eating well Unable to measure voids 2/2 sedentary status, no foley SCr improved  04/29 0701 - 04/30 0700 In: 360 [P.O.:360] Out: -   Filed Weights   07/08/14 0417 07/08/14 1100 07/10/14 0400  Weight: 99.973 kg (220 lb 6.4 oz) 99.973 kg (220 lb 6.4 oz) 101.061 kg (222 lb 12.8 oz)    Scheduled Meds: . aspirin EC  81 mg Oral QPM  . docusate sodium  100 mg Oral Daily  . enoxaparin (LOVENOX) injection  30 mg Subcutaneous Q24H  . feeding supplement (PRO-STAT SUGAR FREE 64)  30 mL Oral BID  . FLUoxetine  20 mg Oral Daily  . insulin aspart  0-9 Units Subcutaneous 6 times per day  . insulin aspart  3 Units Subcutaneous TID WC  . insulin glargine  30 Units Subcutaneous QHS  . levothyroxine  125 mcg Oral QAC breakfast  . nystatin   Topical BID  . pantoprazole  40 mg Oral Daily  . sodium chloride  3 mL Intravenous Q12H  . Vitamin D (Ergocalciferol)  50,000 Units Oral Daily   Continuous Infusions:  PRN Meds:.sodium chloride, acetaminophen **OR** acetaminophen, bisacodyl, guaiFENesin-dextromethorphan, hydrALAZINE, labetalol, metoprolol, morphine injection, ondansetron, oxyCODONE, phenol, senna-docusate, sodium chloride  Current Labs: reviewed    Physical Exam:  Blood pressure 157/51, pulse 62, temperature 97.4 F (36.3 C), temperature source Oral, resp. rate 20, height 5\' 1"  (1.549 m), weight 101.061 kg (222 lb 12.8 oz), SpO2 98 %. GEN: Morbidly obese female lying in bed ENT: Poor dentition, NCAT EYES: EOMI CV: RRR, normal S1 and S2 PULM: Clear bilaterally, limited ABD: Soft, nontender, no bruits SKIN: No rashes or lesions, surgical stump is clean dry and intact EXT: Has pitting edema in the posterior portions of the thighs as well as in the posterior sacral area  A/P 1. AoCKD 1. BL SCr 1.4-1.6 2. Sees ?[facility] at [facility] 3. Suspect related to volume shifts in  perioperative period, possible ATN 4. WOuld have expected decreased SCr post amputation 5. Renal ultrasound on 07/09/14 with no structural explanations 6. UA ordered 7. Hold IVFs given edema, but don't restart diuretics yet; let eat and follow closely 8. Daily weights, Daily Renal Panel, Strict I/Os, Avoid nephrotoxins (NSAIDs, judicious IV Contrast) 2. Hyperkalemia, resolved 1. Would not give Kayexalate unkless K >6.2 2. Continue to monitor 3. S/p R AKA for PVD and nonhealing leg wounds; appears to be doing well 4. Morbid Obesity 5. Hypervolemia / Edema; As per #1 6. DM2 7. HTN  Pearson Grippe MD 07/10/2014, 8:30 AM   Recent Labs Lab 07/08/14 0406 07/09/14 0555 07/10/14 0413  NA 131* 131* 132*  K 5.4* 5.6* 4.9  CL 103 102 102  CO2 20 20 22   GLUCOSE 128* 123* 121*  BUN 52* 59* 65*  CREATININE 2.57* 3.08* 2.62*  CALCIUM 8.1* 8.1* 8.2*    Recent Labs Lab 07/08/14 0406 07/09/14 0555 07/10/14 0413  WBC 8.8 7.2 5.9  HGB 9.0* 8.9* 9.0*  HCT 29.1* 28.1* 28.5*  MCV 90.9 90.1 89.3  PLT 160 171 173

## 2014-07-11 LAB — GLUCOSE, CAPILLARY
GLUCOSE-CAPILLARY: 93 mg/dL (ref 70–99)
GLUCOSE-CAPILLARY: 109 mg/dL — AB (ref 70–99)
GLUCOSE-CAPILLARY: 104 mg/dL — AB (ref 70–99)
Glucose-Capillary: 85 mg/dL (ref 70–99)
Glucose-Capillary: 90 mg/dL (ref 70–99)
Glucose-Capillary: 100 mg/dL — ABNORMAL HIGH (ref 70–99)

## 2014-07-11 LAB — URINE MICROSCOPIC-ADD ON

## 2014-07-11 LAB — URINALYSIS, ROUTINE W REFLEX MICROSCOPIC
Bilirubin Urine: NEGATIVE
Glucose, UA: NEGATIVE mg/dL
KETONES UR: NEGATIVE mg/dL
Leukocytes, UA: NEGATIVE
Nitrite: NEGATIVE
PROTEIN: 30 mg/dL — AB
Specific Gravity, Urine: 1.012 (ref 1.005–1.030)
UROBILINOGEN UA: 0.2 mg/dL (ref 0.0–1.0)
pH: 5 (ref 5.0–8.0)

## 2014-07-11 LAB — BASIC METABOLIC PANEL
Anion gap: 9 (ref 5–15)
BUN: 70 mg/dL — ABNORMAL HIGH (ref 6–20)
CALCIUM: 8.1 mg/dL — AB (ref 8.9–10.3)
CO2: 21 mmol/L — ABNORMAL LOW (ref 22–32)
CREATININE: 2.32 mg/dL — AB (ref 0.44–1.00)
Chloride: 104 mmol/L (ref 101–111)
GFR calc Af Amer: 21 mL/min — ABNORMAL LOW (ref >60)
GFR calc non Af Amer: 18 mL/min — ABNORMAL LOW (ref >60)
GLUCOSE: 94 mg/dL (ref 70–99)
Potassium: 4.8 mmol/L (ref 3.5–5.1)
Sodium: 134 mmol/L — ABNORMAL LOW (ref 135–145)

## 2014-07-11 MED ORDER — TORSEMIDE 20 MG PO TABS
40.0000 mg | ORAL_TABLET | Freq: Every day | ORAL | Status: DC
  Administered 2014-07-11 – 2014-07-12 (×2): 40 mg via ORAL
  Filled 2014-07-11 (×4): qty 2

## 2014-07-11 NOTE — Progress Notes (Signed)
[facility] PROGRESS NOTE  Sandra [name] MHD:[id number] DOB: Sep 16, 1930 DOA: 07/06/2014 PCP:  Melinda Crutch, MD  Assessment/Plan: Active Problems:   DM type 2 causing renal disease   PVD (peripheral vascular disease)   Morbid obesity   Essential hypertension   Hypothyroidism   History of colon cancer   Non-healing wound of lower extremity   Diastolic dysfunction   Acute renal failure   Constipation   AKI (acute kidney injury)   Acute and chronic CK D stage 3-4 Improving. Torsemide started for fluid overload  DM 2 with renal complications. Stable on SSI  Hypothyroidism Continue Synthroid.  HTN No longer hypotensive  Constipation: had results with enema  HPI/Subjective: Pain controlled. Had results with enema today. C/o abdominal wall swelling  Objective: Filed Vitals:   07/10/14 0400 07/10/14 1454 07/10/14 2046 07/11/14 0451  BP: 157/51 147/52 141/36 140/41  Pulse: 62 67 67 66  Temp: 97.4 F (36.3 C) 98 F (36.7 C) 97.8 F (36.6 C) 98 F (36.7 C)  TempSrc: Oral Oral Oral Oral  Resp: 20 18 18 18   Height:      Weight: 101.061 kg (222 lb 12.8 oz)   100.2 kg (220 lb 14.4 oz)  SpO2: 98% 98% 100% 99%    Intake/Output Summary (Last 24 hours) at 07/11/14 1655 Last data filed at 07/11/14 1430  Gross per 24 hour  Intake    630 ml  Output      0 ml  Net    630 ml    Exam:  General: No acute respiratory distress. Eating supper Lungs: Clear to auscultation bilaterally without wheezes or crackles Cardiovascular: Regular rate and rhythm without murmur gallop or rub normal S1 and S2 Abdomen: obese, s, nt, nd Extremities: stump ok. Left leg with ace wrap  Data Reviewed: Basic Metabolic Panel:  Recent Labs Lab 07/07/14 0514 07/08/14 0406 07/09/14 0555 07/10/14 0413 07/11/14 0450  NA 134* 131* 131* 132* 134*  K 5.1 5.4* 5.6* 4.9 4.8  CL 104 103 102 102 104  CO2 21 20 20 22  21*  GLUCOSE 104* 128* 123* 121* 94  BUN 48* 52* 59* 65* 70*  CREATININE 2.24*  2.57* 3.08* 2.62* 2.32*  CALCIUM 8.3* 8.1* 8.1* 8.2* 8.1*    Liver Function Tests:  Recent Labs Lab 07/07/14 0514 07/08/14 0406 07/09/14 0555 07/10/14 0413  AST 21 19 16 15   ALT 13 12 12 11   ALKPHOS 96 89 94 100  BILITOT 0.7 0.5 0.4 0.4  PROT 6.3 5.8* 6.2 6.1  ALBUMIN 2.4* 2.2* 2.3* 2.2*   No results for input(s): LIPASE, AMYLASE in the last 168 hours. No results for input(s): AMMONIA in the last 168 hours.  CBC:  Recent Labs Lab 07/06/14 1425 07/07/14 0514 07/08/14 0406 07/09/14 0555 07/10/14 0413  WBC 7.7 8.9 8.8 7.2 5.9  HGB 9.2* 9.2* 9.0* 8.9* 9.0*  HCT 28.9* 29.1* 29.1* 28.1* 28.5*  MCV 89.8 90.1 90.9 90.1 89.3  PLT 156 159 160 171 173    Cardiac Enzymes: No results for input(s): CKTOTAL, CKMB, CKMBINDEX, TROPONINI in the last 168 hours. BNP (last 3 results)  Recent Labs  07/06/14 1425  BNP 254.3*    ProBNP (last 3 results) No results for input(s): PROBNP in the last 8760 hours.    CBG:  Recent Labs Lab 07/10/14 1614 07/10/14 2111 07/11/14 0706 07/11/14 1125 07/11/14 1228  GLUCAP 111* 113* 85 90 93    Recent Results (from the past 240 hour(s))  Surgical pcr screen  Status: Abnormal   Collection Time: 07/02/14  3:53 PM  Result Value Ref Range Status   MRSA, PCR POSITIVE (A) NEGATIVE Final   Staphylococcus aureus POSITIVE (A) NEGATIVE Final    Comment:        The Xpert SA Assay (FDA approved for NASAL specimens in patients over 50 years of age), is one component of a comprehensive surveillance program.  Test performance has been validated by [facility] for patients greater than or equal to 76 year old. It is not intended to diagnose infection nor to guide or monitor treatment.      Studies: Dg Chest 2 View  07/07/2014   CLINICAL DATA:  81 year old female with a history of chest pain and shortness of breath.  EXAM: CHEST - 2 VIEW  COMPARISON:  03/03/2014, 12/11/2013, 03/08/2012  FINDINGS: Cardiomediastinal silhouette  unchanged. Atherosclerotic calcifications of the aortic arch.  Airspace opacities at the bilateral lung bases with associated interstitial opacities. Blunting of left costophrenic angle.  Calcifications of the mitral annulus.  Moderate pleural effusion on the lateral view.  No pneumothorax.  No displaced fracture.  Unremarkable appearance of the upper abdomen.  Vague opacities overlying the chest likely external to the patient.  IMPRESSION: Interval development of bilateral pleural effusions and airspace disease, potentially representing edema and/or pneumonia.  Atherosclerosis and mitral annular calcifications.  Signed,  Dulcy Fanny. Earleen Newport, DO  [facility]  [facility]   Electronically Signed   By: Corrie Mckusick D.O.   On: 07/07/2014 15:33   US Renal  07/09/2014   CLINICAL DATA:  Acute kidney injury  EXAM: RENAL / URINARY TRACT ULTRASOUND COMPLETE  COMPARISON:  09/14/2013  FINDINGS: Right Kidney:  Length: 11 cm. Increased cortical echogenicity diffusely with borderline cortical thinning. Two simple appearing cysts present, 10 mm in the upper pole and 12 mm in the interpolar region.  Left Kidney:  Limited visualization due to narrow sonographic windows.  Length: 11 cm.  No hydronephrosis or gross mass lesion.  Bladder:  Moderately distended.  No wall thickening.  Left pleural effusion and small left upper quadrant ascites.  IMPRESSION: 1. No hydronephrosis. 2. Medical renal disease. 3. Limited visualization of the left kidney. 4. Left pleural effusion and small ascites.   Electronically Signed   By: Monte Fantasia M.D.   On: 07/09/2014 09:33    Scheduled Meds: . aspirin EC  81 mg Oral QPM  . enoxaparin (LOVENOX) injection  30 mg Subcutaneous Q24H  . feeding supplement (PRO-STAT SUGAR FREE 64)  30 mL Oral BID  . FLUoxetine  20 mg Oral Daily  . insulin aspart  0-9 Units Subcutaneous TID WC  . insulin aspart  3 Units Subcutaneous TID WC  . insulin glargine  30  Units Subcutaneous QHS  . levothyroxine  125 mcg Oral QAC breakfast  . nystatin   Topical BID  . pantoprazole  40 mg Oral Daily  . senna-docusate  2 tablet Oral QHS  . sodium chloride  3 mL Intravenous Q12H  . torsemide  40 mg Oral Daily  . Vitamin D (Ergocalciferol)  50,000 Units Oral Daily   Continuous Infusions:   Time spent: 15 minutes  [facility] Pager [phone]. If 7PM-7AM, please contact night-coverage at [email or web address], password [email or web address] 07/11/2014, 4:55 PM  LOS: 5 days

## 2014-07-11 NOTE — Progress Notes (Addendum)
[facility] [place]  Subjective  - no BM for 1 week   Objective 140/41 66 98 F (36.7 C) (Oral) 18 99%  Intake/Output Summary (Last 24 hours) at 07/11/14 1406 Last data filed at 07/11/14 1245  Gross per 24 hour  Intake    480 ml  Output    400 ml  Net     80 ml   Right AKA continues to heal some serous drainage  Assessment/Planning: S/p AKA Renal dysfunction improving Rx constipation To SNF when renal function stable, restart Torsemide per renal  Ruta Hinds 07/11/2014 2:06 PM --  Laboratory Lab Results:  Recent Labs  07/09/14 0555 07/10/14 0413  WBC 7.2 5.9  HGB 8.9* 9.0*  HCT 28.1* 28.5*  PLT 171 173   BMET  Recent Labs  07/10/14 0413 07/11/14 0450  NA 132* 134*  K 4.9 4.8  CL 102 104  CO2 22 21*  GLUCOSE 121* 94  BUN 65* 70*  CREATININE 2.62* 2.32*  CALCIUM 8.2* 8.1*    COAG Lab Results  Component Value Date   INR 1.17 07/02/2014   No results found for: PTT

## 2014-07-11 NOTE — Progress Notes (Signed)
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 aspirin EC  81 mg Oral QPM  . enoxaparin (LOVENOX) injection  30 mg Subcutaneous Q24H  . feeding supplement (PRO-STAT SUGAR FREE 64)  30 mL Oral BID  . FLUoxetine  20 mg Oral Daily  . insulin aspart  0-9 Units Subcutaneous TID WC  . insulin aspart  3 Units Subcutaneous TID WC  . insulin glargine  30 Units Subcutaneous QHS  . levothyroxine  125 mcg Oral QAC breakfast  . nystatin   Topical BID  . pantoprazole  40 mg Oral Daily  . senna-docusate  2 tablet Oral QHS  . sodium chloride  3 mL Intravenous Q12H  . Vitamin D (Ergocalciferol)  50,000 Units Oral Daily   Continuous Infusions:  PRN Meds:.sodium chloride, acetaminophen **OR** acetaminophen, bisacodyl, guaiFENesin-dextromethorphan, hydrALAZINE, labetalol, metoprolol, morphine injection, ondansetron, oxyCODONE, phenol, sodium chloride  Current Labs: reviewed    Physical Exam:  Blood pressure 140/41, pulse 66, temperature 98 F (36.7 C), temperature source Oral, resp. rate 18, height 5\' 1"  (1.549 m), weight 100.2 kg (220 lb 14.4 oz), SpO2 99 %. GEN: Morbidly obese female lying in bed ENT: Poor dentition, NCAT EYES: EOMI CV: RRR, normal S1 and S2 PULM: Clear bilaterally, limited ABD: Soft, nontender, no bruits SKIN: No rashes or lesions, surgical stump is clean dry and intact EXT: Has pitting edema in the posterior portions of the thighs as well as in the posterior sacral area  A/P 1. AoCKD 1. BL SCr 1.4-1.6 2. Sees ?[facility] at [facility] 3. Suspect related to volume  shifts in perioperative period, possible ATN 4. WOuld have expected decreased SCr post amputation 5. Renal ultrasound on 07/09/14 with no structural explanations 6. UA without hematuria, 1+ proteinuria, 3-6 wbc/hpf 7. With downtrending creatinine, and edema, restart diuretics: Torsemide 40 mg daily (takes twice a day at home) 8. Daily weights, Daily Renal Panel, Strict I/Os, Avoid nephrotoxins (NSAIDs, judicious IV Contrast) 2. Hyperkalemia, resolved 1. Would not give Kayexalate unkless K >6.2 2. Continue to monitor 3. S/p R AKA for PVD and nonhealing leg wounds; appears to be doing well; PT following 4. Morbid Obesity 5. Hypervolemia / Edema; As per #1 6. DM2 7. HTN  Pearson Grippe MD 07/11/2014, 8:53 AM   Recent Labs Lab 07/09/14 0555 07/10/14 0413 07/11/14 0450  NA 131* 132* 134*  K 5.6* 4.9 4.8  CL 102 102 104  CO2 20 22 21*  GLUCOSE 123* 121* 94  BUN 59* 65* 70*  CREATININE 3.08* 2.62* 2.32*  CALCIUM 8.1* 8.2* 8.1*    Recent Labs Lab 07/08/14 0406 07/09/14 0555 07/10/14 0413  WBC 8.8 7.2 5.9  HGB 9.0* 8.9* 9.0*  HCT 29.1* 28.1* 28.5*  MCV 90.9 90.1 89.3  PLT 160 171 173

## 2014-07-11 NOTE — Consult Note (Signed)
WOC wound consult note Reason for Consult:Asked to see LLE by Dr. Rosalia Hammers for chronic edema and scattered weeping areas. Wound type:Venous insufficiency Pressure Ulcer POA: No Measurement:Pinpoint open areas are too small to measure but have leaked serous fluid in the recent past.  (Old dressings are damp and adherent) Wound bed:No wound bed to view Drainage (amount, consistency, odor) Scant to small amount of serous exudate on old dressings Periwound: Evidence of recent fluid removal-linear grooves in patient's skin are apparent Dressing procedure/placement/frequency: I will implement daily cleansing with or house gentle cleanser followed by moisturizing with an excellent once-a-day moisturizer, [id number].  Will will top with with a Dry boot: a dry kerlix wrapped from toe to knee topped with ACE wraps a-plied in a similar fashion.  To prevent heel pressure injury, I will add a Prevalon Boot for pressure redistribution. Thank you for inviting [place] in on this case.  [place] nursing team will not follow, but will remain available to this patient, the nursing and medical teams.  Please re-consult if needed. Thanks, Maudie Flakes, MSN, RN, [place], [place], [place] [phone])

## 2014-07-12 DIAGNOSIS — E1121 Type 2 diabetes mellitus with diabetic nephropathy: Secondary | ICD-10-CM

## 2014-07-12 DIAGNOSIS — E038 Other specified hypothyroidism: Secondary | ICD-10-CM

## 2014-07-12 LAB — GLUCOSE, CAPILLARY
GLUCOSE-CAPILLARY: 128 mg/dL — AB (ref 70–99)
GLUCOSE-CAPILLARY: 135 mg/dL — AB (ref 70–99)
GLUCOSE-CAPILLARY: 173 mg/dL — AB (ref 70–99)
Glucose-Capillary: 104 mg/dL — ABNORMAL HIGH (ref 70–99)
Glucose-Capillary: 111 mg/dL — ABNORMAL HIGH (ref 70–99)

## 2014-07-12 MED ORDER — AMLODIPINE BESYLATE 5 MG PO TABS
5.0000 mg | ORAL_TABLET | Freq: Every day | ORAL | Status: DC
  Administered 2014-07-12 – 2014-07-14 (×3): 5 mg via ORAL
  Filled 2014-07-12 (×3): qty 1

## 2014-07-12 NOTE — Clinical Social Work Note (Signed)
Clinical Social Work Assessment  Patient Details  Name: GERLEAN CID MRN: [id number] Date of Birth: 1930-10-09  Date of referral:  07/12/14               Reason for consult:  Facility Placement, Discharge Planning                Permission sought to share information with:  Family Supports Permission granted to share information::  Yes, Verbal Permission Granted  Name::     Lewis Shock  Relationship::  Son  Contact Information:  [phone]  Housing/Transportation Living arrangements for the past 2 months:  [place] [facility]) Source of Information:  Patient Patient Interpreter Needed:  None Criminal Activity/Legal Involvement Pertinent to Current Situation/Hospitalization:  No - Comment as needed Significant Relationships:  Adult Children Lives with:  Self Do you feel safe going back to the place where you live?  Yes Need for family participation in patient care:  Yes (Comment)  Care giving concerns:  No family/friends at bedside, however patient states that her son was discouraged about patient lack of therapies following surgery.  Patient states that her son was able to contact nursing staff and address concerns directly.   Social Worker assessment / plan:  Holiday representative met with patient at bedside to offer support and discuss patient needs at discharge.  Patient states that she was living at [place] prior to hospitalization.  Patient now with new right AKA and understands that she will not able to return home alone at discharge.  Patient agreeable to SNF search in [facility] with preference to [facility].  CSW to complete FL2 and initiate referral.  CSW to follow up with patient and family regarding available bed offers and assist with continued discharge planning needs.  CSW remains available for support and to facilitate patient discharge needs once medically ready.  Employment status:  Retired Radiation protection practitioner:  Medicare PT Recommendations:  [place] / Referral to community resources:  [place]  Patient/Family's Response to care:  Patient verbalizes her disappointment in unit staff attentiveness while hospitalized.  Patient states that she has been urinating in the bed due to staff not responding quickly enough.  Patient would prefer to go home, however feels that due to lack of therapy (patient perspective) that she will need rehab prior to return home to [facility].  Patient/Family's Understanding of and Emotional Response to Diagnosis, Current Treatment, and Prognosis:  Patient understanding of new AKA and hopeful that she will be able to maintain her independence and return home.  Patient hopeful for placement at [facility] and then return to [facility].  Patient with good support from her son who seems to provide some emotional stability to patient.  Emotional Assessment Appearance:  Appears younger than stated age Attitude/Demeanor/Rapport:  Complaining Affect (typically observed):  Defensive, Depressed, Irritable, Frustrated, Calm Orientation:  Oriented to Self, Oriented to Place, Oriented to  Time, Oriented to Situation Alcohol / Substance use:  Not Applicable Psych involvement (Current and /or in the community):  No (Comment)  Discharge Needs  Concerns to be addressed:  Discharge Planning Concerns Readmission within the last 30 days:  No Current discharge risk:  Dependent with Mobility, Physical Impairment, Lives alone Barriers to Discharge:  Continued Medical Work up  [employer], [place]

## 2014-07-12 NOTE — Progress Notes (Signed)
Utilization review completed.  

## 2014-07-12 NOTE — Progress Notes (Addendum)
  Progress Note  6 Days Post-Op  Subjective:  Asleep-awakes to voice and answers questions  Afebrile 008'Q systolic HR 76'P 950%  [id number]  Filed Vitals:   07/12/14 0502  BP: 151/49  Pulse: 67  Temp: 97.5 F (36.4 C)  Resp:    Physical Exam: Incisions:  Healing nicely with staples in tact   CBC    Component Value Date/Time   WBC 5.9 07/10/2014 0413   WBC 10.5* 01/07/2014 1432   WBC 9.8 06/28/2009 1525   RBC 3.19* 07/10/2014 0413   RBC 3.59* 01/07/2014 1432   RBC 3.60* 01/07/2014 1432   RBC 3.73 06/28/2009 1525   HGB 9.0* 07/10/2014 0413   HGB 10.5* 01/07/2014 1432   HGB 10.7* 06/28/2009 1525   HCT 28.5* 07/10/2014 0413   HCT 32.5* 01/07/2014 1432   HCT 33.0* 06/28/2009 1525   PLT 173 07/10/2014 0413   PLT 249 01/07/2014 1432   PLT 272 06/28/2009 1525   MCV 89.3 07/10/2014 0413   MCV 91 01/07/2014 1432   MCV 88.6 06/28/2009 1525   MCH 28.2 07/10/2014 0413   MCH 29.2 01/07/2014 1432   MCH 28.7 06/28/2009 1525   MCHC 31.6 07/10/2014 0413   MCHC 32.3 01/07/2014 1432   MCHC 32.4 06/28/2009 1525   RDW 14.9 07/10/2014 0413   RDW 13.2 01/07/2014 1432   RDW 14.6* 06/28/2009 1525   LYMPHSABS 1.0 01/14/2014 0728   LYMPHSABS 1.0 01/07/2014 1432   LYMPHSABS 1.9 06/28/2009 1525   MONOABS 0.6 01/14/2014 0728   MONOABS 0.5 06/28/2009 1525   EOSABS 0.5 01/14/2014 0728   EOSABS 0.5 01/07/2014 1432   EOSABS 0.3 06/28/2009 1525   BASOSABS 0.1 01/14/2014 0728   BASOSABS 0.1 01/07/2014 1432   BASOSABS 0.1 06/28/2009 1525   BMET    Component Value Date/Time   NA 134* 07/11/2014 0450   NA 145 07/09/2013 1426   K 4.8 07/11/2014 0450   K 4.4 07/09/2013 1426   CL 104 07/11/2014 0450   CL 103 07/09/2013 1426   CO2 21* 07/11/2014 0450   CO2 31 07/09/2013 1426   GLUCOSE 94 07/11/2014 0450   GLUCOSE 183* 07/09/2013 1426   BUN 70* 07/11/2014 0450   BUN 34* 07/09/2013 1426   CREATININE 2.32* 07/11/2014 0450   CREATININE 1.4* 07/09/2013 1426   CALCIUM 8.1* 07/11/2014  0450   CALCIUM 9.2 07/09/2013 1426   GFRNONAA 18* 07/11/2014 0450   GFRAA 21* 07/11/2014 0450   INR    Component Value Date/Time   INR 1.17 07/02/2014 1553    Intake/Output Summary (Last 24 hours) at 07/12/14 0726 Last data filed at 07/12/14 0200  Gross per 24 hour  Intake   1230 ml  Output    450 ml  Net    780 ml   Assessment:  81 y.o. female is s/p:  Right AKA  6 Days Post-Op  Plan: -right AKA stump healing nicely with staples in tact -retention sock ordered per pt from the company (she states they didn't have one) -renal function is improving-back to SNF once medically ready -pt's renal function improved yesterday -DVT prophylaxis:  Lovenox  Leontine Locket, PA-C [facility] [phone] 07/12/2014 7:26 AM  Agree with above. To SNF when medically ready.  GI: BM yesterday with enema Renal: crt trending back to baseline DM: CBG's under good control. Cont PTx  Deitra Mayo, MD, [place] [phone] Office: [phone]

## 2014-07-12 NOTE — Progress Notes (Signed)
[facility] Progress Note   Sandra [name] BTD:[id number] DOB: 1931-02-08 DOA: 07/06/2014 PCP:  Melinda Crutch, MD  Brief narrative: Sandra [name] is a 81 y.o. female with past medical history of peripheral vascular disease, hypertension, diabetes mellitus type 2, chronic kidney disease who was admitted for left AKA by the vascular surgery team.she underwent surgery on 4/26. The hospitalist service was consulting for medical management.  Nephrology service with consulted on 4/28 for acute renal failure   Subjective: Patient evaluated while having breakfast this morning. She has no significant complaints. Specifically no complaints of shortness of breath, chest pain or pain in leg.  She is very anxious about being not physician in the bed properly but is otherwise stable.  Assessment/Plan: Active Problems: Acute and chronic CK D stage 3-4 -creatinine on admission 1.93 rising to 2.24NPT 3.08 - Torsemide started by renal team for fluid overload-creatinine noted to be improving  DM 2 with renal complications.  -take Lantus 40 units daily at bedtime at home -currently on Lantus 30 units, mealtime NovoLog and low-dose sliding scale with meals -sugars stable  Hypothyroidism Continue Synthroid.  HTN -resume Norvasc at 5 mg daily which is her home dose -continue to hold losartan in setting of acute renal failure  Code Status: full code Family Communication:  Disposition Plan: per primary team   Antibiotics: Anti-infectives    Start     Dose/Rate Route Frequency Ordered Stop   07/06/14 2000  ciprofloxacin (CIPRO) tablet 500 mg  Status:  Discontinued     500 mg Oral 2 times daily 07/06/14 1518 07/06/14 1534   07/06/14 1630  ciprofloxacin (CIPRO) tablet 500 mg  Status:  Discontinued     500 mg Oral Daily with breakfast 07/06/14 1534 07/09/14 1156   07/05/14 1406  cefUROXime (ZINACEF) 1.5 g in dextrose 5 % 50 mL IVPB     1.5 g 100 mL/hr over 30 Minutes Intravenous 30 min pre-op 07/05/14  1406 07/06/14 1120      Objective: Filed Weights   07/10/14 0400 07/11/14 0451 07/12/14 0502  Weight: 101.061 kg (222 lb 12.8 oz) 100.2 kg (220 lb 14.4 oz) 101.7 kg (224 lb 3.3 oz)    Intake/Output Summary (Last 24 hours) at 07/12/14 1316 Last data filed at 07/12/14 0900  Gross per 24 hour  Intake   1230 ml  Output    451 ml  Net    779 ml     Vitals Filed Vitals:   07/11/14 0451 07/11/14 1700 07/11/14 1954 07/12/14 0502  BP: 140/41 156/30 159/45 151/49  Pulse: 66 69 66 67  Temp: 98 F (36.7 C) 97.7 F (36.5 C) 97.6 F (36.4 C) 97.5 F (36.4 C)  TempSrc: Oral Oral Oral Oral  Resp: 18     Height:      Weight: 100.2 kg (220 lb 14.4 oz)   101.7 kg (224 lb 3.3 oz)  SpO2: 99% 100% 99% 100%    Exam:  General:  Pt is alert, not in acute distress  HEENT: No icterus, No thrush  Cardiovascular: regular rate and rhythm, S1/S2 No murmur  Respiratory: clear to auscultation bilaterally   Abdomen: Soft, +Bowel sounds, non tender, non distended, no guarding  MSK: No cyanosis or clubbing- some dependent edema noted on the back of thighs  Data Reviewed: Basic Metabolic Panel:  Recent Labs Lab 07/07/14 0514 07/08/14 0406 07/09/14 0555 07/10/14 0413 07/11/14 0450  NA 134* 131* 131* 132* 134*  K 5.1 5.4* 5.6* 4.9 4.8  CL  104 103 102 102 104  CO2 21 20 20 22  21*  GLUCOSE 104* 128* 123* 121* 94  BUN 48* 52* 59* 65* 70*  CREATININE 2.24* 2.57* 3.08* 2.62* 2.32*  CALCIUM 8.3* 8.1* 8.1* 8.2* 8.1*   Liver Function Tests:  Recent Labs Lab 07/07/14 0514 07/08/14 0406 07/09/14 0555 07/10/14 0413  AST 21 19 16 15   ALT 13 12 12 11   ALKPHOS 96 89 94 100  BILITOT 0.7 0.5 0.4 0.4  PROT 6.3 5.8* 6.2 6.1  ALBUMIN 2.4* 2.2* 2.3* 2.2*   No results for input(s): LIPASE, AMYLASE in the last 168 hours. No results for input(s): AMMONIA in the last 168 hours. CBC:  Recent Labs Lab 07/06/14 1425 07/07/14 0514 07/08/14 0406 07/09/14 0555 07/10/14 0413  WBC 7.7 8.9 8.8  7.2 5.9  HGB 9.2* 9.2* 9.0* 8.9* 9.0*  HCT 28.9* 29.1* 29.1* 28.1* 28.5*  MCV 89.8 90.1 90.9 90.1 89.3  PLT 156 159 160 171 173   Cardiac Enzymes: No results for input(s): CKTOTAL, CKMB, CKMBINDEX, TROPONINI in the last 168 hours. BNP (last 3 results)  Recent Labs  07/06/14 1425  BNP 254.3*    ProBNP (last 3 results) No results for input(s): PROBNP in the last 8760 hours.  CBG:  Recent Labs Lab 07/11/14 1738 07/11/14 2116 07/12/14 0159 07/12/14 0620 07/12/14 1156  GLUCAP 100* 104* 128* 104* 135*    Recent Results (from the past 240 hour(s))  Surgical pcr screen     Status: Abnormal   Collection Time: 07/02/14  3:53 PM  Result Value Ref Range Status   MRSA, PCR POSITIVE (A) NEGATIVE Final   Staphylococcus aureus POSITIVE (A) NEGATIVE Final    Comment:        The Xpert SA Assay (FDA approved for NASAL specimens in patients over 84 years of age), is one component of a comprehensive surveillance program.  Test performance has been validated by [facility] for patients greater than or equal to 54 year old. It is not intended to diagnose infection nor to guide or monitor treatment.      Studies: No results found.  Scheduled Meds:  Scheduled Meds: . amLODipine  5 mg Oral Daily  . aspirin EC  81 mg Oral QPM  . enoxaparin (LOVENOX) injection  30 mg Subcutaneous Q24H  . feeding supplement (PRO-STAT SUGAR FREE 64)  30 mL Oral BID  . FLUoxetine  20 mg Oral Daily  . insulin aspart  0-9 Units Subcutaneous TID WC  . insulin aspart  3 Units Subcutaneous TID WC  . insulin glargine  30 Units Subcutaneous QHS  . levothyroxine  125 mcg Oral QAC breakfast  . nystatin   Topical BID  . pantoprazole  40 mg Oral Daily  . senna-docusate  2 tablet Oral QHS  . sodium chloride  3 mL Intravenous Q12H  . torsemide  40 mg Oral Daily  . Vitamin D (Ergocalciferol)  50,000 Units Oral Daily   Continuous Infusions:   Time spent on care of this patient: 35  minutes   [place], MD 07/12/2014, 1:16 PM  LOS: 6 days   [facility] Office  [phone] Pager - Text Page per [email or web address]  If 7PM-7AM, please contact night-coverage [email or web address]

## 2014-07-12 NOTE — Progress Notes (Addendum)
[facility] Rounding Note Subjective:  Having issues with phantom pain Says "urinating a lot" and "eating a lot of ice" Wants to get back to [facility] (but lives in the [place] living section - would anticipate will need skilled for a while)   05/01 0701 - 05/02 0700 In: 1230 [P.O.:1230] Out: 450 [Urine:450] + unmeasured "a lot" per pt  Filed Weights   07/10/14 0400 07/11/14 0451 07/12/14 0502  Weight: 101.061 kg (222 lb 12.8 oz) 100.2 kg (220 lb 14.4 oz) 101.7 kg (224 lb 3.3 oz)    Physical Exam:  Blood pressure 151/49, pulse 67, temperature 97.5 F (36.4 C), temperature source Oral, resp. rate 18, height 5\' 1"  (1.549 m), weight 101.7 kg (224 lb 3.3 oz), SpO2 100 %. GEN: Morbidly obese female, sl disheveled ENT: Poor dentition CV: Regular, normal S1 and S2 PULM: Clear bilaterally ABD: Soft, nontender, no bruits SKIN: No rashes or lesions, surgical stump is clean dry and intact EXT: Has pitting edema in the posterior portions of the thighs as well as in the posterior sacral area - left leg VERY dysesthetic "don't touch that" and wearing LE boot. AKA incision looks OK   Recent Labs Lab 07/09/14 0555 07/10/14 0413 07/11/14 0450  NA 131* 132* 134*  K 5.6* 4.9 4.8  CL 102 102 104  CO2 20 22 21*  GLUCOSE 123* 121* 94  BUN 59* 65* 70*  CREATININE 3.08* 2.62* 2.32*  CALCIUM 8.1* 8.2* 8.1*    Recent Labs Lab 07/08/14 0406 07/09/14 0555 07/10/14 0413  WBC 8.8 7.2 5.9  HGB 9.0* 8.9* 9.0*  HCT 29.1* 28.1* 28.5*  MCV 90.9 90.1 89.3  PLT 160 171 173   Medications . amLODipine  5 mg Oral Daily  . aspirin EC  81 mg Oral QPM  . enoxaparin (LOVENOX) injection  30 mg Subcutaneous Q24H  . feeding supplement (PRO-STAT SUGAR FREE 64)  30 mL Oral BID  . FLUoxetine  20 mg Oral Daily  . insulin aspart  0-9 Units Subcutaneous TID WC  . insulin aspart  3 Units Subcutaneous TID WC  . insulin glargine  30 Units Subcutaneous QHS  . levothyroxine  125 mcg  Oral QAC breakfast  . nystatin   Topical BID  . pantoprazole  40 mg Oral Daily  . senna-docusate  2 tablet Oral QHS  . sodium chloride  3 mL Intravenous Q12H  . torsemide  40 mg Oral Daily  . 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 [facility] ATN Assessment/Recommendations 1. AoCKD 1. BL SCr 1.4-1.6 2. Sees ?Powell at [facility] 3. Suspect related to volume shifts in perioperative period, possible ATN 4. Would have expected decreased SCr post amputation (reduced MM) 5. Renal ultrasound on 07/09/14 with no structural explanations 6. UA without hematuria, 1+ proteinuria, 3-6 wbc/hpf 7. With downtrending creatinine, and edema, restarted diuretics on 5/1: Torsemide 40 mg daily (takes twice a day at home) 8. Still has a LOT of edema on exam. Will likely need to go back to her BID dosing but will give another day to see if creatinine falls further then resume home dosing. 2. Hyperkalemia, resolved 1. Would not give Kayexalate unkless K >6.2 2. Continue to monitor 3. S/p R AKA for PVD and nonhealing leg wounds; appears to be doing well; PT following 4. Morbid Obesity 5. Hypervolemia / Edema;  As per #1 6. DM2 7. HTN  Jamal Maes, MD [facility] [phone] Pager 07/12/2014, 11:56 AM

## 2014-07-12 NOTE — Clinical Social Work Placement (Signed)
   CLINICAL SOCIAL WORK PLACEMENT  NOTE  Date:  07/12/2014  Patient Details  Name: Sandra [name] MRN: [id number] Date of Birth: 1930/11/29  Clinical Social Work is seeking post-discharge placement for this patient at the [place] level of care (*CSW will initial, date and re-position this form in  chart as items are completed):  Yes   Patient/family provided with [place] Work Department's list of facilities offering this level of care within the geographic area requested by the patient (or if unable, by the patient's family).  Yes   Patient/family informed of their freedom to choose among providers that offer the needed level of care, that participate in Medicare, Medicaid or managed care program needed by the patient, have an available bed and are willing to accept the patient.  Yes   Patient/family informed of [place]'s ownership interest in [facility] and [facility], as well as of the fact that they are under no obligation to receive care at these facilities.  PASRR submitted to EDS on 07/12/14     PASRR number received on       Existing PASRR number confirmed on       FL2 transmitted to all facilities in geographic area requested by pt/family on 07/12/14     FL2 transmitted to all facilities within larger geographic area on       Patient informed that his/her managed care company has contracts with or will negotiate with certain facilities, including the following:            Patient/family informed of bed offers received.  Patient chooses bed at       Physician recommends and patient chooses bed at      Patient to be transferred to   on  .  Patient to be transferred to facility by       Patient family notified on   of transfer.  Name of family member notified:        PHYSICIAN       Additional Comment:    _______________________________________________ Barbette Or, [place]

## 2014-07-12 NOTE — Progress Notes (Signed)
Physical Therapy Treatment Patient Details Name: [name] MRN: [id number] DOB: 17-Feb-1931 Today's Date: 07/12/2014    History of Present Illness Pt is an 81 y.o. Female s/p R AKA due to non-healing wound. PMH DM, CAD, PVD, HTN, CVA, colon cancer, and obesity with non-healing LE wounds. Pt is now s/p R AKA.     PT Comments    Pt progressing slowly towards physical therapy goals. Pt is self-limiting at times, however follows commands well when cues are clear and direct. The focus of the session was a transfer OOB with the amputee sling. Noted that family was wanting pt to be able to use the [facility] instead of the bed pan, and the nursing staff was educated on techniques to to get pt safely to/from [name] and get sling donned/doffed. Recommended to NT/RN that BSC be positioned right next to the bed (flattened with bed rail down), so pt can lean over onto the bed on the L for sling management and peri-care. Continue to recommend SNF at d/c for further skilled therapy. Will continue to follow.   Follow Up Recommendations  SNF;Supervision/Assistance - 24 hour     Equipment Recommendations  Wheelchair (measurements PT);Wheelchair cushion (measurements PT);Rolling walker with 5" wheels    Recommendations for Other Services       Precautions / Restrictions Precautions Precautions: Fall Precaution Comments: morbid obesity; L rotator cuff tear Restrictions Weight Bearing Restrictions: Yes RLE Weight Bearing: Non weight bearing    Mobility  Bed Mobility Overal bed mobility: Needs Assistance Bed Mobility: Rolling Rolling: Mod assist Sidelying to sit: +2 for physical assistance;Max assist       General bed mobility comments: VC's to reach for bed rail with RUE.   Transfers                 General transfer comment: Had nursing acquire AKA maxi move sling from portable equipment. Was going to use it to work on getting pt to drop arm BSC at family's request; however drop arm BSC had been  removed from room by portable equipment earlier. OT called portable equipment and asked if they could please bring it back and he said if they still had it they would, but otherwise they would have to order one for her. Used AKA sling to get pt up to recliner and then had the pt's NT come in and show her how to get the sling out from behind her and then I had her help me put it back under the pt on the pt's right side and I did the pt's left side. We also explained to NT Sandra [name]) how this could also allow pt to be gotten up to droparm Sandra [name] once it got back with having pt's intact LE being next to the bed while she is stitting on the [facility] [name] so that when pt is finshed with BSC, the patient can then lean to her left over onto bed and raising up her LAKA so that staff can then clean her up. Made [name] aware that she would need 2 people to do all of these tasks. We than spoke to unit Director about what we had done with pt and educated NT on and she advised [place] to also talk to the pt's nurse. RN, Sandra [name], also educated on transfer techniques and donning/doffing sling.   Ambulation/Gait             General Gait Details: Further mobility deferred at this time.    Stairs  Wheelchair Mobility    Modified Rankin (Stroke Patients Only)       Balance       Sitting balance - Comments: Pt is able to pull herself forward with Bil UEs while seated in recliner to A with getting maxi move pad under her and out from under her                            Cognition Arousal/Alertness: Awake/alert Behavior During Therapy: Anxious Overall Cognitive Status: Within Functional Limits for tasks assessed                      Exercises      General Comments General comments (skin integrity, edema, etc.): Drop-arm bedside commode present in room at end of session.      Pertinent Vitals/Pain Pain Assessment: Faces Faces Pain Scale: Hurts whole lot Pain Location: RLE with some  movements, L lower leg due to wounds Pain Descriptors / Indicators: Grimacing;Tender Pain Intervention(s): Monitored during session;Repositioned    Home Living                      Prior Function            PT Goals (current goals can now be found in the care plan section) Acute Rehab PT Goals Patient Stated Goal: Be able to get to the bathroom PT Goal Formulation: With patient Time For Goal Achievement: 07/21/14 Potential to Achieve Goals: Fair Progress towards PT goals: Progressing toward goals    Frequency  Min 2X/week    PT Plan Current plan remains appropriate    Co-evaluation PT/OT/SLP Co-Evaluation/Treatment: Yes Reason for Co-Treatment: Complexity of the patient's impairments (multi-system involvement);For patient/therapist safety PT goals addressed during session: Mobility/safety with mobility;Balance;Proper use of DME OT goals addressed during session: ADL's and self-care;Strengthening/ROM     End of Session Equipment Utilized During Treatment: Gait belt;Oxygen Activity Tolerance: Patient limited by fatigue;No increased pain (weakness) Patient left: in chair;with call bell/phone within reach     Time: 1357-1433 PT Time Calculation (min) (ACUTE ONLY): 36 min  Charges:  $Therapeutic Activity: 8-22 mins                    G Codes:      Rolinda Roan 2014/07/22, 3:10 PM  Rolinda Roan, PT, DPT Acute Rehabilitation Services Pager: [phone]

## 2014-07-12 NOTE — Progress Notes (Signed)
Occupational Therapy Treatment Patient Details Name: Sandra [name] MRN: [id number] DOB: 1931-02-16 Today's Date: 07/12/2014    History of present illness Pt is an 81 y.o. Female s/p R AKA due to non-healing wound. PMH DM, CAD, PVD, HTN, CVA, colon cancer, and obesity with non-healing LE woulds.    OT comments  This pt was evaled by OT on 07/09/14 and rest of her OT was deferred to SNF; however we did not sign off on her. PT came and spoke to me this AM about pt's family really wanting her to get up the [facility] for bowel movements and their was problem solving that needed to be done to make this happen safely. So we saw the pt with PT for this one more session to educate staff on amputee sling and using it to get pt up to [facility]. Although we were not actually able to get pt up to [facility] due to the drop arm BSC being one from room (we did get it back right at end of our session) the concept of how to get the maxi move sling out from under to use the [facility] after transferring her to the [facility] and back under her when getting her off of the [facility] holds true for how you get it out and back under her while she in the recliner, which is what we did with the pt's NT as well as going over how to have pt lean left onto the bed from drop arm 3n1 with left arm dropped to get her cleaned after having a bowel movement.   Follow Up Recommendations  SNF;Supervision/Assistance - 24 hour    Equipment Recommendations  3 in 1 bedside comode (bariatric drop arm)       Precautions / Restrictions Precautions Precautions: Fall Precaution Comments: morbid obesity; L rotator cuff tear Restrictions Weight Bearing Restrictions: Yes RLE Weight Bearing: Non weight bearing       Mobility Bed Mobility Overal bed mobility: Needs Assistance Bed Mobility: Rolling Rolling: Mod assist         General bed mobility comments: VC's to reach for bed rail with RUE.   Transfers                 General transfer comment: Had nursing  acquire AKA maxi move sling from portable equipment. Was going to use it to work on getting pt to drop arm BSC at family's request; however drop arm BSC had been removed from room by portable equipment earlier. I called portable equipment and asked if they could please bring it back and he said if they still had it they would, but otherwise they would have to order one for her. Used AKA sling to get her up to recliner and then had the pt's NT come in and show her how to get the sling out from behind her and then I had her help me put it back under the pt on the pt's right side and I did the pt's left side. We also explained to NT [facility] how this could also allow pt to be gotten up to droparm [facility] once it got back with having pt's intact LE being next to the bed while she is stitting on the [facility] so that when pt is finshed with BSC, the patient can then lean to her left over onto bed and raising up her LAKA so that staff can then clean her up. Made [name] aware that she would need 2 people to do all of these  tasks. We than spoke to unit Director about what we had done with pt and educated NT on and she advised [place] to also talk to the pt's nurse which PT graciously agreed to do.    Balance       Sitting balance - Comments: Pt is able to pull herself forward with Bil UEs while seated in recliner to A with getting maxi move pad under her and out from under her                                           Cognition   Behavior During Therapy: Anxious Overall Cognitive Status: Within Functional Limits for tasks assessed                                    Pertinent Vitals/ Pain       Pain Assessment: Faces Faces Pain Scale: Hurts whole lot Pain Location: RLE with some movements (raising up in maxi move sling and scooting backwards in recliner with pad Pain Descriptors / Indicators: Sharp;Shooting Pain Intervention(s): Monitored during session;Repositioned               Plan  Discharge plan remains appropriate    Co-evaluation    PT/OT/SLP Co-Evaluation/Treatment: Yes Reason for Co-Treatment: Complexity of the patient's impairments (multi-system involvement);For patient/therapist safety   OT goals addressed during session: ADL's and self-care;Strengthening/ROM      End of Session Equipment Utilized During Treatment:  (Maxi move)   Activity Tolerance Patient tolerated treatment well   Patient Left in chair;with call bell/phone within reach   Nurse Communication Mobility status (NT)        Time: 3785-8850 OT Time Calculation (min): 36 min  Charges: OT General Charges $OT Visit: 1 Procedure OT Treatments $Self Care/Home Management : 8-22 mins  Almon Register [phone] 07/12/2014, 3:02 PM

## 2014-07-12 NOTE — Progress Notes (Signed)
Medicare Important Message given? YES  (If response is "NO", the following Medicare IM given date fields will be blank)  Date Medicare IM given: 07/12/14 Medicare IM given by:  Kyira Volkert  

## 2014-07-13 LAB — RENAL FUNCTION PANEL
ALBUMIN: 2.2 g/dL — AB (ref 3.5–5.0)
Anion gap: 9 (ref 5–15)
BUN: 78 mg/dL — ABNORMAL HIGH (ref 6–20)
CHLORIDE: 103 mmol/L (ref 101–111)
CO2: 25 mmol/L (ref 22–32)
CREATININE: 1.83 mg/dL — AB (ref 0.44–1.00)
Calcium: 8.5 mg/dL — ABNORMAL LOW (ref 8.9–10.3)
GFR, EST AFRICAN AMERICAN: 28 mL/min — AB (ref >60)
GFR, EST NON AFRICAN AMERICAN: 25 mL/min — AB (ref >60)
Glucose, Bld: 108 mg/dL — ABNORMAL HIGH (ref 70–99)
POTASSIUM: 4.2 mmol/L (ref 3.5–5.1)
Phosphorus: 4.3 mg/dL (ref 2.5–4.6)
SODIUM: 137 mmol/L (ref 135–145)

## 2014-07-13 LAB — GLUCOSE, CAPILLARY
GLUCOSE-CAPILLARY: 138 mg/dL — AB (ref 70–99)
Glucose-Capillary: 90 mg/dL (ref 70–99)
Glucose-Capillary: 124 mg/dL — ABNORMAL HIGH (ref 70–99)
Glucose-Capillary: 189 mg/dL — ABNORMAL HIGH (ref 70–99)

## 2014-07-13 MED ORDER — VITAMIN D3 25 MCG (1000 UNIT) PO TABS
5000.0000 [IU] | ORAL_TABLET | Freq: Every day | ORAL | Status: DC
  Administered 2014-07-13 – 2014-07-14 (×2): 5000 [IU] via ORAL
  Filled 2014-07-13 (×2): qty 5

## 2014-07-13 MED ORDER — TORSEMIDE 20 MG PO TABS
40.0000 mg | ORAL_TABLET | Freq: Two times a day (BID) | ORAL | Status: DC
  Administered 2014-07-13 – 2014-07-14 (×3): 40 mg via ORAL
  Filled 2014-07-13 (×5): qty 2

## 2014-07-13 NOTE — Progress Notes (Addendum)
[facility] Rounding Note Subjective:  No significant events overnight. Today patient continues to complain of back pain and RLE (s/p R-AKA) phantom limb pains, like she can "feel pain where her ankle should be" without significant relief. She attributes some pain due to limited mobility in hospital bed. Continues reportedly good urination in bed pan vs towels. Denies any dysuria or hematuria. Admits still having significant edema (abdomen and R-residual limb stump and LLE) Denies any significant new symptoms, CP, SOB, cough, abd pain.  05/02 0701 - 05/03 0700 In: 600 [P.O.:600] Out: 1101 [Urine:1100; Stool:1] + unmeasured x 3 voids  Filed Weights   07/11/14 0451 07/12/14 0502 07/13/14 0421  Weight: 220 lb 14.4 oz (100.2 kg) 224 lb 3.3 oz (101.7 kg) 217 lb 2.5 oz (98.5 kg)    Physical Exam:  Blood pressure 135/37, pulse 65, temperature 97.9 F (36.6 C), temperature source Axillary, resp. rate 18, height 5\' 1"  (1.549 m), weight 217 lb 2.5 oz (98.5 kg), SpO2 95 %. GEN: Morbidly obese female, bedbound, cooperative, NAD HEENT: Poor dentition CV: RRR, S1 and S2, no murmurs heard PULM: Mostly CTAB without overt crackles heard, no rhonchi or wheezing. Some diminished breath sounds bilateral bases likely due to body habitus. Good air movement. ABD: Obese with stria, soft, lower abdominal wall edema present, non-tender without concerning distention, +active BS SKIN: No rashes or lesions, RLE surgical stump is clean and intact, staples in place, mild serous weeping due to edema EXT: Bilateral stable mostly unchanged pitting edema (RLE stump) and LLE extending through posterior thigh, hip/sacral region, LLE boot in place.   Recent Labs Lab 07/10/14 0413 07/11/14 0450 07/13/14 0518  NA 132* 134* 137  K 4.9 4.8 4.2  CL 102 104 103  CO2 22 21* 25  GLUCOSE 121* 94 108*  BUN 65* 70* 78*  CREATININE 2.62* 2.32* 1.83*  CALCIUM 8.2* 8.1* 8.5*  PHOS  --   --  4.3    Recent  Labs Lab 07/08/14 0406 07/09/14 0555 07/10/14 0413  WBC 8.8 7.2 5.9  HGB 9.0* 8.9* 9.0*  HCT 29.1* 28.1* 28.5*  MCV 90.9 90.1 89.3  PLT 160 171 173   Medications . amLODipine  5 mg Oral Daily  . aspirin EC  81 mg Oral QPM  . cholecalciferol  5,000 Units Oral Daily  . enoxaparin (LOVENOX) injection  30 mg Subcutaneous Q24H  . feeding supplement (PRO-STAT SUGAR FREE 64)  30 mL Oral BID  . FLUoxetine  20 mg Oral Daily  . insulin aspart  0-9 Units Subcutaneous TID WC  . insulin aspart  3 Units Subcutaneous TID WC  . insulin glargine  30 Units Subcutaneous QHS  . levothyroxine  125 mcg Oral QAC breakfast  . nystatin   Topical BID  . pantoprazole  40 mg Oral Daily  . senna-docusate  2 tablet Oral QHS  . sodium chloride  3 mL Intravenous Q12H  . torsemide  40 mg Oral BID     Background:  25F with AoCKD (BL SCr 1.4-1.6) presumed ischemic (peri-operative) ATN s/p R AKA 07/06/14 in setting of morbid obesity, DM2 with AKI on CKD,  Assessment/Recommendations 1. AoCKD - Improving 1. BL SCr 1.4-1.6 (Followed by ?Powell at [facility]) 2. Suspect related to volume shifts in perioperative period, possible ATN 3. Renal ultrasound on 07/09/14 with no structural explanations 4. UA without hematuria, 1+ proteinuria, 3-6 wbc/hpf. Without urine culture 5. Improved continued down-trending creatinine to 1.83 (5/3) over past 3 days (peak SCr 3.08 on 4/29)  with resumed half home dose Torsemide 40mg  PO daily on 5/1 6. Remains volume overloaded with edema, but otherwise stable volume status with clear lungs, good UOP in response to diuresis (inaccurate I/O balance, should be closer to even/negative d/t unmeasured urines) 7. Increase Torsemide PO 40mg  BID today (resume home dose) 8. Continue monitor daily Cr to ensure cont improve towards b/l on resumed PO diuresis 9. If discharged back to SNF, would likely need weekly BMET monitoring to ensure stable K, Cr with diuresis 2. Hyperkalemia, resolved 1. Would not  give Kayexalate unkless K >6.2 2. Continue to monitor 3. H/o Vitamin D Deficiency 1. No documented Vitamin D level, previously on Vit D replacement outpatient at 1,000u daily increased in mid April to 5,000 units daily. However, since in admitted on 4/26 has been receiving incorrect dosing Vitamin D with 50,000 units daily x 1 week. 2. Discussed with pharmacy. Switched dose to standard Vitamin D2 5,000 units daily starting today, likely continue total 8-12 weeks, follow-up outpatient with Vitamin D level 4. S/p R AKA for PVD and nonhealing leg wounds; appears to be doing well; PT following 5. Morbid Obesity 6. Hypervolemia / Edema; As per #1 7. DM2 8. HTN  Nobie Putnam, [place], PGY-2 (Currently working with [facility])  07/13/2014, 11:11 AM   I have seen and examined this patient and agree with plan and assessment in the above note by Dr. Parks Ranger. Pt's kidney function continues to improve, trending toward baseline. UOP difficult as no foley, but reports "lots of output). Still with substantial abd wall, LE and stump edema. OK at this point to increase torsemide back to home dose. Vit D dose corrected as noted above. Londa Mackowski B,MD 07/13/2014 11:47 AM

## 2014-07-13 NOTE — Progress Notes (Signed)
   VASCULAR SURGERY ASSESSMENT & PLAN:  * 7 Days Post-Op s/p: right above-the-knee amputation  *  To skilled nursing facility when medically ready. Medicine thinks that she is ready from their standpoint.  * She will need to go to a skilled nursing facility so that she can get continued physical therapy for now.  SUBJECTIVE: no specific complaints.  PHYSICAL EXAM: Filed Vitals:   07/12/14 2000 07/12/14 2224 07/13/14 0421 07/13/14 1039  BP:  145/43 141/52 135/37  Pulse: 68 63 65 65  Temp:  97.8 F (36.6 C) 98.1 F (36.7 C) 97.9 F (36.6 C)  TempSrc:  Oral Oral Axillary  Resp: 20 18 18 18   Height:      Weight:   217 lb 2.5 oz (98.5 kg)   SpO2:  93% 96% 95%   Her right AKA is healing nicely.  LABS: Lab Results  Component Value Date   WBC 5.9 07/10/2014   HGB 9.0* 07/10/2014   HCT 28.5* 07/10/2014   MCV 89.3 07/10/2014   PLT 173 07/10/2014   Lab Results  Component Value Date   CREATININE 1.83* 07/13/2014   Lab Results  Component Value Date   INR 1.17 07/02/2014   CBG (last 3)   Recent Labs  07/13/14 0619 07/13/14 1137 07/13/14 1628  GLUCAP 90 124* 189*    Active Problems:   DM type 2 causing renal disease   PVD (peripheral vascular disease)   Morbid obesity   Essential hypertension   Hypothyroidism   History of colon cancer   Non-healing wound of lower extremity   Diastolic dysfunction   Acute renal failure   Constipation   AKI (acute kidney injury)  Gae Gallop Beeper: [phone] 07/13/2014

## 2014-07-13 NOTE — Progress Notes (Signed)
[facility] Progress Note   MAHOGANIE BASHER KCL:[id number] DOB: Feb 23, 1931 DOA: 07/06/2014 PCP:  Melinda Crutch, MD  Brief narrative: Sandra [name] is a 81 y.o. female with past medical history of peripheral vascular disease, hypertension, diabetes mellitus type 2, chronic kidney disease who was admitted for left AKA by the vascular surgery team.she underwent surgery on 4/26. The hospitalist service was consulting for medical management.  Nephrology service with consulted on 4/28 for acute renal failure   Subjective: Patient evaluated this AM. She has no complaints other than pain in her stump.   Assessment/Plan: Active Problems: Acute and chronic CK D stage 3-4 -creatinine on admission 1.93 rising to 2.24 and then to 3.08 - Torsemide started by renal team for fluid overload-creatinine noted to be improving  DM 2 with renal complications.  -take Lantus 40 units daily at bedtime at home -currently on Lantus 30 units, mealtime NovoLog and low-dose sliding scale with meals -sugars stable  Hypothyroidism Continue Synthroid.  HTN -resumed Norvasc at 5 mg daily which is her home dose -continue to hold losartan in setting of acute renal failure  Code Status: full code Family Communication:  Disposition Plan: per primary team   Antibiotics: Anti-infectives    Start     Dose/Rate Route Frequency Ordered Stop   07/06/14 2000  ciprofloxacin (CIPRO) tablet 500 mg  Status:  Discontinued     500 mg Oral 2 times daily 07/06/14 1518 07/06/14 1534   07/06/14 1630  ciprofloxacin (CIPRO) tablet 500 mg  Status:  Discontinued     500 mg Oral Daily with breakfast 07/06/14 1534 07/09/14 1156   07/05/14 1406  cefUROXime (ZINACEF) 1.5 g in dextrose 5 % 50 mL IVPB     1.5 g 100 mL/hr over 30 Minutes Intravenous 30 min pre-op 07/05/14 1406 07/06/14 1120      Objective: Filed Weights   07/11/14 0451 07/12/14 0502 07/13/14 0421  Weight: 100.2 kg (220 lb 14.4 oz) 101.7 kg (224 lb 3.3 oz) 98.5 kg  (217 lb 2.5 oz)    Intake/Output Summary (Last 24 hours) at 07/13/14 1526 Last data filed at 07/13/14 0900  Gross per 24 hour  Intake    720 ml  Output   1100 ml  Net   -380 ml     Vitals Filed Vitals:   07/12/14 2000 07/12/14 2224 07/13/14 0421 07/13/14 1039  BP:  145/43 141/52 135/37  Pulse: 68 63 65 65  Temp:  97.8 F (36.6 C) 98.1 F (36.7 C) 97.9 F (36.6 C)  TempSrc:  Oral Oral Axillary  Resp: 20 18 18 18   Height:      Weight:   98.5 kg (217 lb 2.5 oz)   SpO2:  93% 96% 95%    Exam:  General:  Pt is alert, not in acute distress  HEENT: No icterus, No thrush  Cardiovascular: regular rate and rhythm, S1/S2 No murmur  Respiratory: clear to auscultation bilaterally   Abdomen: Soft, +Bowel sounds, non tender, non distended, no guarding  MSK: No cyanosis or clubbing- some dependent edema noted on the back of thighs  Data Reviewed: Basic Metabolic Panel:  Recent Labs Lab 07/08/14 0406 07/09/14 0555 07/10/14 0413 07/11/14 0450 07/13/14 0518  NA 131* 131* 132* 134* 137  K 5.4* 5.6* 4.9 4.8 4.2  CL 103 102 102 104 103  CO2 20 20 22  21* 25  GLUCOSE 128* 123* 121* 94 108*  BUN 52* 59* 65* 70* 78*  CREATININE 2.57* 3.08* 2.62* 2.32* 1.83*  CALCIUM 8.1* 8.1* 8.2* 8.1* 8.5*  PHOS  --   --   --   --  4.3   Liver Function Tests:  Recent Labs Lab 07/07/14 0514 07/08/14 0406 07/09/14 0555 07/10/14 0413 07/13/14 0518  AST 21 19 16 15   --   ALT 13 12 12 11   --   ALKPHOS 96 89 94 100  --   BILITOT 0.7 0.5 0.4 0.4  --   PROT 6.3 5.8* 6.2 6.1  --   ALBUMIN 2.4* 2.2* 2.3* 2.2* 2.2*   No results for input(s): LIPASE, AMYLASE in the last 168 hours. No results for input(s): AMMONIA in the last 168 hours. CBC:  Recent Labs Lab 07/07/14 0514 07/08/14 0406 07/09/14 0555 07/10/14 0413  WBC 8.9 8.8 7.2 5.9  HGB 9.2* 9.0* 8.9* 9.0*  HCT 29.1* 29.1* 28.1* 28.5*  MCV 90.1 90.9 90.1 89.3  PLT 159 160 171 173   Cardiac Enzymes: No results for input(s):  CKTOTAL, CKMB, CKMBINDEX, TROPONINI in the last 168 hours. BNP (last 3 results)  Recent Labs  07/06/14 1425  BNP 254.3*    ProBNP (last 3 results) No results for input(s): PROBNP in the last 8760 hours.  CBG:  Recent Labs Lab 07/12/14 1156 07/12/14 1736 07/12/14 2223 07/13/14 0619 07/13/14 1137  GLUCAP 135* 173* 111* 90 124*    No results found for this or any previous visit (from the past 240 hour(s)).   Studies: No results found.  Scheduled Meds:  Scheduled Meds: . amLODipine  5 mg Oral Daily  . aspirin EC  81 mg Oral QPM  . cholecalciferol  5,000 Units Oral Daily  . enoxaparin (LOVENOX) injection  30 mg Subcutaneous Q24H  . feeding supplement (PRO-STAT SUGAR FREE 64)  30 mL Oral BID  . FLUoxetine  20 mg Oral Daily  . insulin aspart  0-9 Units Subcutaneous TID WC  . insulin aspart  3 Units Subcutaneous TID WC  . insulin glargine  30 Units Subcutaneous QHS  . levothyroxine  125 mcg Oral QAC breakfast  . nystatin   Topical BID  . pantoprazole  40 mg Oral Daily  . senna-docusate  2 tablet Oral QHS  . sodium chloride  3 mL Intravenous Q12H  . torsemide  40 mg Oral BID   Continuous Infusions:   Time spent on care of this patient: 25 minutes   Herbst, MD 07/13/2014, 3:26 PM  LOS: 7 days   [facility] Office  [phone] Pager - Text Page per [email or web address]  If 7PM-7AM, please contact night-coverage [email or web address]

## 2014-07-13 NOTE — Clinical Social Work Note (Signed)
CSW provided patient bed offers. CSW has encouraged patient to choose from bed offers available. Patient would like [facility]. Facility has been notified and CSW provided facility with contact info for patient's son.    Liz Beach MSW, [place], [place], [phone]

## 2014-07-14 LAB — RENAL FUNCTION PANEL
ALBUMIN: 2.3 g/dL — AB (ref 3.5–5.0)
Anion gap: 11 (ref 5–15)
BUN: 82 mg/dL — ABNORMAL HIGH (ref 6–20)
CO2: 25 mmol/L (ref 22–32)
CREATININE: 1.78 mg/dL — AB (ref 0.44–1.00)
Calcium: 8.7 mg/dL — ABNORMAL LOW (ref 8.9–10.3)
Chloride: 100 mmol/L — ABNORMAL LOW (ref 101–111)
GFR calc Af Amer: 29 mL/min — ABNORMAL LOW (ref >60)
GFR, EST NON AFRICAN AMERICAN: 25 mL/min — AB (ref >60)
Glucose, Bld: 116 mg/dL — ABNORMAL HIGH (ref 70–99)
Phosphorus: 4.2 mg/dL (ref 2.5–4.6)
Potassium: 4 mmol/L (ref 3.5–5.1)
Sodium: 136 mmol/L (ref 135–145)

## 2014-07-14 LAB — GLUCOSE, CAPILLARY
Glucose-Capillary: 102 mg/dL — ABNORMAL HIGH (ref 70–99)
Glucose-Capillary: 151 mg/dL — ABNORMAL HIGH (ref 70–99)

## 2014-07-14 MED ORDER — OXYCODONE HCL 5 MG PO TABS: 5.0000 mg | ORAL_TABLET | Freq: Four times a day (QID) | ORAL | Status: DC | PRN

## 2014-07-14 MED ORDER — INSULIN GLARGINE 100 UNIT/ML SOLOSTAR PEN: 30.0000 [IU] | PEN_INJECTOR | Freq: Every day | SUBCUTANEOUS | Status: DC

## 2014-07-14 MED ORDER — VITAMIN D3 25 MCG (1000 UNIT) PO TABS: 5000.0000 [IU] | ORAL_TABLET | Freq: Every day | ORAL | Status: DC

## 2014-07-14 NOTE — Progress Notes (Signed)
[facility] Progress Note   Sandra [name]:[id number] DOB: Aug 12, 1930 DOA: 07/06/2014 PCP:  Melinda Crutch, MD  Brief narrative: Sandra [name] is a 81 y.o. female with past medical history of peripheral vascular disease, hypertension, diabetes mellitus type 2, chronic kidney disease who was admitted for left AKA by the vascular surgery team.she underwent surgery on 4/26. The hospitalist service was consulting for medical management.  Nephrology service with consulted on 4/28 for acute renal failure   Subjective: Patient evaluated this AM. She had no complaints today.   Assessment/Plan: Active Problems: Acute and chronic CK D stage 3-4 -creatinine on admission 1.93 rising to 2.24 and then to 3.08 - Torsemide started by renal team for fluid overload-creatinine noted to be improving daily  DM 2 with renal complications.  -take Lantus 40 units daily at bedtime at home -currently on Lantus 30 units, mealtime NovoLog and low-dose sliding scale with meals -sugars stable  Hypothyroidism Continue Synthroid.  HTN -resumed Norvasc at 5 mg daily which is her home dose -continue to hold losartan in setting of acute renal failure - Renal team recommends it be held until Cr back to 1.5  Code Status: full code Family Communication:  Disposition Plan: per primary team   Antibiotics: Anti-infectives    Start     Dose/Rate Route Frequency Ordered Stop   07/06/14 2000  ciprofloxacin (CIPRO) tablet 500 mg  Status:  Discontinued     500 mg Oral 2 times daily 07/06/14 1518 07/06/14 1534   07/06/14 1630  ciprofloxacin (CIPRO) tablet 500 mg  Status:  Discontinued     500 mg Oral Daily with breakfast 07/06/14 1534 07/09/14 1156   07/05/14 1406  cefUROXime (ZINACEF) 1.5 g in dextrose 5 % 50 mL IVPB     1.5 g 100 mL/hr over 30 Minutes Intravenous 30 min pre-op 07/05/14 1406 07/06/14 1120      Objective: Filed Weights   07/12/14 0502 07/13/14 0421 07/14/14 0402  Weight: 101.7 kg (224 lb 3.3  oz) 98.5 kg (217 lb 2.5 oz) 93.7 kg (206 lb 9.1 oz)    Intake/Output Summary (Last 24 hours) at 07/14/14 1331 Last data filed at 07/14/14 0945  Gross per 24 hour  Intake    480 ml  Output   1600 ml  Net  -1120 ml     Vitals Filed Vitals:   07/13/14 0421 07/13/14 1039 07/13/14 2033 07/14/14 0402  BP: 141/52 135/37 151/44 140/45  Pulse: 65 65 63 62  Temp: 98.1 F (36.7 C) 97.9 F (36.6 C) 98.3 F (36.8 C) 98 F (36.7 C)  TempSrc: Oral Axillary Oral Oral  Resp: 18 18 18 18   Height:      Weight: 98.5 kg (217 lb 2.5 oz)   93.7 kg (206 lb 9.1 oz)  SpO2: 96% 95% 96% 97%    Exam:  General:  Pt is alert, not in acute distress  HEENT: No icterus, No thrush  Cardiovascular: regular rate and rhythm, S1/S2 No murmur  Respiratory: clear to auscultation bilaterally   Abdomen: Soft, +Bowel sounds, non tender, non distended, no guarding  MSK: No cyanosis or clubbing- continues to have mild dependent edema noted on the back of thighs- stump looks clean, staples intact  Data Reviewed: Basic Metabolic Panel:  Recent Labs Lab 07/09/14 0555 07/10/14 0413 07/11/14 0450 07/13/14 0518 07/14/14 0353  NA 131* 132* 134* 137 136  K 5.6* 4.9 4.8 4.2 4.0  CL 102 102 104 103 100*  CO2 20 22 21*  25 25  GLUCOSE 123* 121* 94 108* 116*  BUN 59* 65* 70* 78* 82*  CREATININE 3.08* 2.62* 2.32* 1.83* 1.78*  CALCIUM 8.1* 8.2* 8.1* 8.5* 8.7*  PHOS  --   --   --  4.3 4.2   Liver Function Tests:  Recent Labs Lab 07/08/14 0406 07/09/14 0555 07/10/14 0413 07/13/14 0518 07/14/14 0353  AST 19 16 15   --   --   ALT 12 12 11   --   --   ALKPHOS 89 94 100  --   --   BILITOT 0.5 0.4 0.4  --   --   PROT 5.8* 6.2 6.1  --   --   ALBUMIN 2.2* 2.3* 2.2* 2.2* 2.3*   No results for input(s): LIPASE, AMYLASE in the last 168 hours. No results for input(s): AMMONIA in the last 168 hours. CBC:  Recent Labs Lab 07/08/14 0406 07/09/14 0555 07/10/14 0413  WBC 8.8 7.2 5.9  HGB 9.0* 8.9* 9.0*  HCT  29.1* 28.1* 28.5*  MCV 90.9 90.1 89.3  PLT 160 171 173   Cardiac Enzymes: No results for input(s): CKTOTAL, CKMB, CKMBINDEX, TROPONINI in the last 168 hours. BNP (last 3 results)  Recent Labs  07/06/14 1425  BNP 254.3*    ProBNP (last 3 results) No results for input(s): PROBNP in the last 8760 hours.  CBG:  Recent Labs Lab 07/13/14 1137 07/13/14 1628 07/13/14 2029 07/14/14 0623 07/14/14 1131  GLUCAP 124* 189* 138* 102* 151*    No results found for this or any previous visit (from the past 240 hour(s)).   Studies: No results found.  Scheduled Meds:  Scheduled Meds: . amLODipine  5 mg Oral Daily  . aspirin EC  81 mg Oral QPM  . cholecalciferol  5,000 Units Oral Daily  . enoxaparin (LOVENOX) injection  30 mg Subcutaneous Q24H  . feeding supplement (PRO-STAT SUGAR FREE 64)  30 mL Oral BID  . FLUoxetine  20 mg Oral Daily  . insulin aspart  0-9 Units Subcutaneous TID WC  . insulin aspart  3 Units Subcutaneous TID WC  . insulin glargine  30 Units Subcutaneous QHS  . levothyroxine  125 mcg Oral QAC breakfast  . nystatin   Topical BID  . pantoprazole  40 mg Oral Daily  . senna-docusate  2 tablet Oral QHS  . sodium chloride  3 mL Intravenous Q12H  . torsemide  40 mg Oral BID   Continuous Infusions:   Time spent on care of this patient: 25 minutes   Goldonna, MD 07/14/2014, 1:31 PM  LOS: 8 days   [facility] Office  [phone] Pager - Text Page per [email or web address]  If 7PM-7AM, please contact night-coverage [email or web address]

## 2014-07-14 NOTE — Progress Notes (Signed)
DC IV per MD orders and protocol; patient DC'd to [facility] place; transport here for patient; all belongings packed and transported with ambulance to [facility].  Rowe Pavy, RN

## 2014-07-14 NOTE — Progress Notes (Signed)
Patient up in chair with lift; call bell within reach; will continue to monitor.  Rowe Pavy, RN

## 2014-07-14 NOTE — Progress Notes (Signed)
No pressure ulcers present upon discharge, notified Damaris Hippo. Of a documentation of stage II, will discontinue wound under assessment.  Rowe Pavy, RN

## 2014-07-14 NOTE — Progress Notes (Signed)
Patient has been asked twice today (0730, [phone]) if she would like to get up to the chair, patient refuses, spoke with patient and said I would be back in about an hour to get up in the chair, explained to patient the importance of getting up out of bed.  Rowe Pavy, RN

## 2014-07-14 NOTE — Progress Notes (Addendum)
LCSW spoke with Hoyle Sauer at [facility] with regards to patient and discharge for today.   Agreeable to take patient today as patient has chosen to go to SNF.  LCSW to complete discharge and paperwork. Will send DC summary to [facility] through [place].  Patient son Legrand Como (listed in contacts on facesheet) has been contacted via phone. Number Listed in chart goes straight to voice mail. Message left on voice mail. All other numbers in chart are not accurate for patient.  Carolyn on unit 2W And seeing patient. Patient completed paperwork for patient to admit to SNF. No barriers for DC. Patient agreeable with SNF. LCSW will call for transport to SNF once RN is ready.  RN made aware all CSW paperwork has been completed.   Lane Hacker, MSW Clinical Social Work: Emergency Room [phone]

## 2014-07-14 NOTE — Discharge Summary (Signed)
Discharge Summary    Sandra [name] 12/23/1930 81 y.o. female  [id number]  Admission Date: 07/06/2014  Discharge Date: 07/14/14  Physician: Angelia Mould, MD  Admission Diagnosis: Peripheral Vascular Disease with nonhealing wound right foot  I70.235   HPI:   This is a 81 y.o. female who I last saw in July 2015. She has had wounds of both lower extremities. I originally saw her in December 2014. At that time she had reasonable flow in the left side. Flow on the right side was compromised and we considered a right femoropopliteal bypass graft however I felt that she would be at very high-risk because of her age, obesity, and comorbidities. Her wounds were gradually improving and therefore elected not to proceed with revascularization. When I saw her in July, she did not have any open wounds. We were continuing with conservative treatment. It had been able while since she had her previous arteriogram and if we were to consider revascularization in the future the plan would be to repeat her study.  Of note, I did review the arteriogram from June 2014. There was no significant aortoiliac occlusive disease. On the right side the common femoral and deep femoral artery were patent. The superficial femoral artery was occluded proximally with reconstitution of the above-knee popliteal artery. There was single-vessel runoff via the anterior tibial artery on the right. On the left side the common femoral and deep femoral artery were patent. There was mild diffuse disease of the superficial femoral artery. The left popliteal artery was patent. There was anterior tibial runoff on the left. Peroneal and posterior tibial arteries on the left were occluded.  Since I saw her last, she has developed a wound on her metatarsal head of the right great toe. There is also a wound on the right second toe. She has significant edema of both lower extremities and cellulitis of the right leg. There are no wounds on  the left foot. I have reviewed her records from the wound care center. She continues aggressive outpatient wound care. I have also reviewed her records from Dr. Jess Barters office. He felt that if she was a candidate for revascularization she could potentially heal a first ray amputation. If she was not a candidate she would require more proximal amputation. She was started on Cipro. This visit was on 05/11/2014.  Her activity is very limited and she has significant dyspnea with any activity. I do not get any clear-cut history of rest pain in the foot.  Hospital Course:  The patient was admitted to the hospital and taken to the operating room on 07/06/2014 and underwent: Right above knee amputation.  An incisional wound vac was place due to the amount of edematous fluid.  This was removed on POD 2.  Wound is healing well.  At discharge, she continues to have substantial abdominal wall, LLE, and right stump edema.  She was changed back to torsemide bid.   The pt tolerated the procedure well and was transported to the PACU in good condition.   [facility] was asked to help manage this pt medically.  WOC was consulted for assistance requested for severe excoriation and yeast rash in pannus folds. EMR describes skin folds as being red, macerated, and moist with partial thickness skin loss. Appearance is consistent with intertrigo. Interdry silver-impregnated fabric ordered for use by bedside nurses and instructions provided. This product should remain in place for 5 days for optimal plan of care to provide antimicrobial benefits and wick moisture away  from skin.  They also were consulted for chronic edema and scattered weeping areas of the left leg.  I will implement daily cleansing with or house gentle cleanser followed by moisturizing with an excellent once-a-day moisturizer, [id number]. Will will top with with a Dry boot: a dry kerlix wrapped from toe to knee topped with ACE wraps a-plied in a similar fashion. To  prevent heel pressure injury, I will add a Prevalon Boot for pressure redistribution.  By POD 1, she was doing well from a surgical standpoint.  Her creatinine was increased from 1.93 to a maximum of 3.08 and back to 1.78 on the day of discharge.  A nephrology consult was obtained and her medications were adjusted throughout her hospital stay.  Her Losartan was discontinued due to her renal function.  She did have a renal u/s, which revealed she is without any major structural exhalations for renal failure.  Her UOP was unable to be measured due to sedentary status and no foley.  She did have hyperkalemia as well and this also resolved.  The diabetic coordinator was also consulted.  Her Lantus was changed to 30 units qhs.   A nutrition consult was also obtained for nutritional needs.  She did have hypoalbuminemia.  Intervention was liquid protein po 69ml with meals and each supplement provides 100 kcal, 15 grams protein.  She did have yeast of the pannus and is on nystatin powder.  Pt denies any urinary sx.  Her Cipro was discontinued since she does not have ongoing infection.  The remainder of the hospital course consisted of increasing mobilization and increasing intake of solids without difficulty.  CBC    Component Value Date/Time   WBC 5.9 07/10/2014 0413   WBC 10.5* 01/07/2014 1432   WBC 9.8 06/28/2009 1525   RBC 3.19* 07/10/2014 0413   RBC 3.59* 01/07/2014 1432   RBC 3.60* 01/07/2014 1432   RBC 3.73 06/28/2009 1525   HGB 9.0* 07/10/2014 0413   HGB 10.5* 01/07/2014 1432   HGB 10.7* 06/28/2009 1525   HCT 28.5* 07/10/2014 0413   HCT 32.5* 01/07/2014 1432   HCT 33.0* 06/28/2009 1525   PLT 173 07/10/2014 0413   PLT 249 01/07/2014 1432   PLT 272 06/28/2009 1525   MCV 89.3 07/10/2014 0413   MCV 91 01/07/2014 1432   MCV 88.6 06/28/2009 1525   MCH 28.2 07/10/2014 0413   MCH 29.2 01/07/2014 1432   MCH 28.7 06/28/2009 1525   MCHC 31.6 07/10/2014 0413   MCHC 32.3 01/07/2014 1432     MCHC 32.4 06/28/2009 1525   RDW 14.9 07/10/2014 0413   RDW 13.2 01/07/2014 1432   RDW 14.6* 06/28/2009 1525   LYMPHSABS 1.0 01/14/2014 0728   LYMPHSABS 1.0 01/07/2014 1432   LYMPHSABS 1.9 06/28/2009 1525   MONOABS 0.6 01/14/2014 0728   MONOABS 0.5 06/28/2009 1525   EOSABS 0.5 01/14/2014 0728   EOSABS 0.5 01/07/2014 1432   EOSABS 0.3 06/28/2009 1525   BASOSABS 0.1 01/14/2014 0728   BASOSABS 0.1 01/07/2014 1432   BASOSABS 0.1 06/28/2009 1525    BMET    Component Value Date/Time   NA 136 07/14/2014 0353   NA 145 07/09/2013 1426   K 4.0 07/14/2014 0353   K 4.4 07/09/2013 1426   CL 100* 07/14/2014 0353   CL 103 07/09/2013 1426   CO2 25 07/14/2014 0353   CO2 31 07/09/2013 1426   GLUCOSE 116* 07/14/2014 0353   GLUCOSE 183* 07/09/2013 1426   BUN 82* 07/14/2014 0353  BUN 34* 07/09/2013 1426   CREATININE 1.78* 07/14/2014 0353   CREATININE 1.4* 07/09/2013 1426   CALCIUM 8.7* 07/14/2014 0353   CALCIUM 9.2 07/09/2013 1426   GFRNONAA 25* 07/14/2014 0353   GFRAA 29* 07/14/2014 0353      Discharge Instructions    Call MD for:  redness, tenderness, or signs of infection (pain, swelling, bleeding, redness, odor or green/yellow discharge around incision site)    Complete by:  As directed      Call MD for:  severe or increased pain, loss or decreased feeling  in affected limb(s)    Complete by:  As directed      Call MD for:  temperature >100.5    Complete by:  As directed      Discharge wound care:    Complete by:  As directed   Shower daily with soap and water over incision starting today.  Otherwise, keep stump dry and covered with retention sock.     Resume previous diet    Complete by:  As directed            Discharge Diagnosis:  Peripheral Vascular Disease with nonhealing wound right foot  I70.235  Secondary Diagnosis: Patient Active Problem List   Diagnosis Date Noted  . Constipation 07/10/2014  . AKI (acute kidney injury)   . Non-healing wound of lower  extremity 07/06/2014  . Diastolic dysfunction 89/37/3428  . Acute renal failure 07/06/2014  . Shoulder pain, acute   . Diabetic foot infection   . Cellulitis of right lower extremity   . Atherosclerotic peripheral vascular disease with ulceration   . Morbid obesity 01/08/2014  . Essential hypertension 01/08/2014  . Hypothyroidism 01/08/2014  . Depression 01/08/2014  . History of colon cancer 01/08/2014  . History of colectomy 01/08/2014  . Cellulitis of leg, right 01/07/2014  . Hyperkalemia 01/07/2014  . Atherosclerosis of native arteries of the extremities with ulceration(440.23) 11/26/2012  . PVD (peripheral vascular disease) 09/24/2012  . Ulcer of lower limb, unspecified-Bilateral legs 08/27/2012  . Pain in limb-Bilateral leg 08/27/2012  . Anemia, iron deficiency 01/17/2012  . DM type 2 causing renal disease 09/28/2010  . Arthritis 09/28/2010   Past Medical History  Diagnosis Date  . Anemia   . CAD (coronary artery disease)   . Peripheral vascular disease   . Hypertension   . Cancer     colon; s/p colectomy 2007  . Anemia, iron deficiency 01/17/2012  . Hemorrhoids   . Nosebleed   . Stroke   . Cellulitis 03/08/12  . Diabetes mellitus     IDDM ; Type 2 for 54 years  . Hypothyroidism     since childhood  . Shortness of breath dyspnea   . Depression   . Wound infection     on right foot   . Diabetic foot ulcers   . RBBB (right bundle branch block with left anterior fascicular block)   . Chronic kidney disease   . Edema of both legs        Medication List    STOP taking these medications        ciprofloxacin 500 MG tablet  Commonly known as:  CIPRO     doxycycline 100 MG tablet  Commonly known as:  VIBRA-TABS     furosemide 20 MG tablet  Commonly known as:  LASIX     losartan 100 MG tablet  Commonly known as:  COZAAR     Vitamin D (Ergocalciferol) 50000 UNITS Caps capsule  Commonly  known as:  DRISDOL      TAKE these medications        amLODipine  5 MG tablet  Commonly known as:  NORVASC  Take 5 mg by mouth daily.     aspirin EC 81 MG tablet  Take 81 mg by mouth every evening.     BD PEN NEEDLE NANO U/F 32G X 4 MM Misc  Generic drug:  Insulin Pen Needle     cholecalciferol 1000 UNITS tablet  Commonly known as:  VITAMIN D  Take 5 tablets (5,000 Units total) by mouth daily.     FLUoxetine 20 MG tablet  Commonly known as:  PROZAC  Take 20 mg by mouth daily.     Insulin Glargine 100 UNIT/ML Solostar Pen  Commonly known as:  LANTUS SOLOSTAR  Inject 30 Units into the skin daily at 10 pm. 40 units     levothyroxine 125 MCG tablet  Commonly known as:  SYNTHROID, LEVOTHROID  Take 125 mcg by mouth. Synthroid ONlY     nystatin powder  Commonly known as:  MYCOSTATIN  Apply topically 2 (two) times daily.     oxyCODONE 5 MG immediate release tablet  Commonly known as:  Oxy IR/ROXICODONE  Take 1 tablet (5 mg total) by mouth every 6 (six) hours as needed for moderate pain.     PRODIGY NO CODING BLOOD GLUC test strip  Generic drug:  glucose blood     torsemide 20 MG tablet  Commonly known as:  DEMADEX  Take 40 mg by mouth 2 (two) times daily.        Prescriptions given: Roxicodone #30 No Refill  Instructions: 1.  Daily cleansing with or house gentle cleanser followed by moisturizing with an excellent once-a-day moisturizer, [id number]. Will will top with with a Dry boot: a dry kerlix wrapped from toe to knee topped with ACE wraps a-plied in a similar fashion daily. To prevent heel pressure injury, add a Prevalon Boot for pressure redistribution.  2.  Weekly monitoring of BMP to ensure stable K+ and creatinine with diuresis.  3.  Monitoring of Vitamin D level   Disposition: SNF  Patient's condition: is Limited  Follow up: 1. Dr. Scot Dock in 4 weeks 2. Dr. Florene Glen in 4-6 weeks (will need Vitamin D level followed)  Leontine Locket, PA-C [facility] [phone] 07/14/2014  11:16 AM

## 2014-07-14 NOTE — Care Management Note (Signed)
    Page 1 of 1   07/14/2014     3:55:12 PM CARE MANAGEMENT NOTE 07/14/2014  Patient:  Sandra [name], Sandra [name]   Account Number:  [id number]  Date Initiated:  07/09/2014  Documentation initiated by:  Marvetta Gibbons  Subjective/Objective Assessment:   Pt admitted with non healing ulcer, s/p right above knee amputation  ARF     Action/Plan:   PTA pt was at [place], will need SFN at time of discharge- CSW following   Anticipated DC Date:  07/14/2014   Anticipated DC Plan:  [place]  In-house referral  Clinical Social Worker      DC Planning Services  CM consult      Choice offered to / List presented to:             Status of service:  Completed, signed off Medicare Important Message given?  YES (If response is "NO", the following Medicare IM given date fields will be blank) Date Medicare IM given:  07/09/2014 Medicare IM given by:  Marvetta Gibbons Date Additional Medicare IM given:  07/12/2014 Additional Medicare IM given by:  Marvetta Gibbons  Discharge Disposition:  [place]  Per UR Regulation:  Reviewed for med. necessity/level of care/duration of stay  If discussed at [place] of Stay Meetings, dates discussed:    Comments:

## 2014-07-14 NOTE — Progress Notes (Signed)
[facility] Rounding Note Subjective: No significant events overnight. Today patient has no new concerns. She reports that she has chosen [facility], anticipate discharge to SNF soon for rehab once ready and medically cleared (was told this could be as soon as tomorrow). She expresses her concerns about gaining strength to weight bear on her LLE. Continues to complain of intermittent RLE phantom limb pain. Continues reportedly good urination in bed pan vs towels. Admits having edema (abdomen and R-residual limb stump and LLE) Denies any significant new symptoms, CP, SOB, cough, abd pain.  05/03 0701 - 05/04 0700 In: 960 [P.O.:960] Out: 1200 [Urine:1200] + unmeasured x 3 voids  Filed Weights   07/12/14 0502 07/13/14 0421 07/14/14 0402  Weight: 224 lb 3.3 oz (101.7 kg) 217 lb 2.5 oz (98.5 kg) 206 lb 9.1 oz (93.7 kg)    Physical Exam:  Blood pressure 140/45, pulse 62, temperature 98 F (36.7 C), temperature source Oral, resp. rate 18, height 5\' 1"  (1.549 m), weight 206 lb 9.1 oz (93.7 kg), SpO2 97 %. GEN: Morbidly obese female, bedbound, cooperative, NAD CV: RRR, S1 and S2, no murmurs heard PULM: Mostly CTAB without overt crackles heard, no rhonchi or wheezing. Unchanged stable diminished breath sounds bilateral bases likely due to body habitus. Good air movement. ABD: Obese with stria, soft, lower abdominal wall edema present (unchanged), non-tender without concerning distention, +active BS SKIN: No rashes or lesions, RLE surgical stump is clean and intact, staples in place (with improved weeping fluid), skin wrinkles more evident on LLE EXT: Improved bilateral edema (now +1 with more dependent and less overall pitting) of LLE (extending up thigh) and RLE (stump)   Recent Labs Lab 07/11/14 0450 07/13/14 0518 07/14/14 0353  NA 134* 137 136  K 4.8 4.2 4.0  CL 104 103 100*  CO2 21* 25 25  GLUCOSE 94 108* 116*  BUN 70* 78* 82*  CREATININE 2.32* 1.83* 1.78*  CALCIUM  8.1* 8.5* 8.7*  PHOS  --  4.3 4.2    Recent Labs Lab 07/08/14 0406 07/09/14 0555 07/10/14 0413  WBC 8.8 7.2 5.9  HGB 9.0* 8.9* 9.0*  HCT 29.1* 28.1* 28.5*  MCV 90.9 90.1 89.3  PLT 160 171 173   Medications . amLODipine  5 mg Oral Daily  . aspirin EC  81 mg Oral QPM  . cholecalciferol  5,000 Units Oral Daily  . enoxaparin (LOVENOX) injection  30 mg Subcutaneous Q24H  . feeding supplement (PRO-STAT SUGAR FREE 64)  30 mL Oral BID  . FLUoxetine  20 mg Oral Daily  . insulin aspart  0-9 Units Subcutaneous TID WC  . insulin aspart  3 Units Subcutaneous TID WC  . insulin glargine  30 Units Subcutaneous QHS  . levothyroxine  125 mcg Oral QAC breakfast  . nystatin   Topical BID  . pantoprazole  40 mg Oral Daily  . senna-docusate  2 tablet Oral QHS  . sodium chloride  3 mL Intravenous Q12H  . torsemide  40 mg Oral BID     Background:  47F with AoCKD (BL SCr 1.4-1.6) presumed ischemic (peri-operative) ATN s/p R AKA 07/06/14 in setting of morbid obesity, DM2 with AKI on CKD,  Assessment/Recommendations 1. AoCKD - Improving 1. BL SCr 1.4-1.6 (Followed by ?[facility] at [facility]) 2. Suspect related to volume shifts in perioperative period, possible ATN 3. Renal ultrasound on 07/09/14 with no structural explanations 4. UA without hematuria, 1+ proteinuria, 3-6 wbc/hpf. Without urine culture 5. Improved continued down-trending SCr approaching b/l  from 1.83 to 1.78 (5/4) (peak SCr 3.08 on 4/29) 6. Continue Torsemide PO 40mg  BID (home dose, restarted 5/3) 7. Improved volume status with good diuresis on resumed home diuresis, good UOP in response to diuresis, down -11 lbs in 24 hrs (206 lb from 217, ?scale), charted -1.2L in 24 hrs (inaccurate I/O balance d/t unmeasured urines, suspect closer to negative balance). Clinically with improved LLE edema but persistent lower abd wall edema, lungs mostly clear with mild crackles. 8. Recommend to continue monitoring daily Cr to ensure cont improve towards b/l  on resumed PO diuresis 2. Hyperkalemia, resolved 1. Would not give Kayexalate unkless K >6.2 2. Continue to monitor 3. H/o Vitamin D Deficiency 1. No documented Vitamin D level, previously on Vit D replacement outpatient at 1,000u daily increased in mid April to 5,000 units daily. However, since in admitted on 4/26 has been receiving incorrect dosing Vitamin D with 50,000 units daily x 1 week. 2. Discussed with pharmacy. Switched dose to standard Vitamin D2 5,000 units daily starting today, likely continue total 8-12 weeks, follow-up outpatient with Vitamin D level and PTH level.  4. S/p R AKA for PVD and nonhealing leg wounds; appears to be doing well; PT following 5. Morbid Obesity 6. Hypervolemia / Edema; As per #1 7. DM2 8. HTN  Renal will sign off today. If discharged back to SNF, would likely need weekly BMET monitoring to ensure stable K, Cr with cont home diuresis (Torsemide PO 40 BID). Continue to HOLD ARB (Losartan 100mg ) on discharge until Cr returns to baseline ~1.5 and follow-up with PCP outpatient. No need to follow-up with Nephrology outpatient at this time, as renal function seems to be improving.  Nobie Putnam, [place], PGY-2 (Currently working with [facility] - Renal Service)  07/14/2014, 7:23 AM   I have seen and examined this patient and agree with assessment and final recommendations in the above excellent note.Jamal Maes B,MD 07/14/2014 11:33 AM

## 2014-07-14 NOTE — Clinical Social Work Placement (Signed)
   CLINICAL SOCIAL WORK PLACEMENT  NOTE  Date:  07/14/2014  Patient Details  Name: Sandra [name] MRN: [id number] Date of Birth: Jun 04, 1930  Clinical Social Work is seeking post-discharge placement for this patient at the [place] level of care (*CSW will initial, date and re-position this form in  chart as items are completed):  Yes   Patient/family provided with [place] Work Department's list of facilities offering this level of care within the geographic area requested by the patient (or if unable, by the patient's family).  Yes   Patient/family informed of their freedom to choose among providers that offer the needed level of care, that participate in Medicare, Medicaid or managed care program needed by the patient, have an available bed and are willing to accept the patient.  Yes   Patient/family informed of [place]'s ownership interest in [facility] and [facility], as well as of the fact that they are under no obligation to receive care at these facilities.  PASRR submitted to EDS on 07/12/14     PASRR number received on       Existing PASRR number confirmed on       FL2 transmitted to all facilities in geographic area requested by pt/family on 07/12/14     FL2 transmitted to all facilities within larger geographic area on       Patient informed that his/her managed care company has contracts with or will negotiate with certain facilities, including the following:            Patient/family informed of bed offers received. 07/13/14  Patient chooses bed at     [facility]  Physician recommends and patient chooses bed at      SNF Patient to be transferred to   on  . 07/14/2014  Patient to be transferred to facility by     EMS/PTAR  Patient family notified on   of transfer. 07/14/2014   (message left as no one at the bedside and no answer via phone)     Name of family member notified:      Lewis Shock  PHYSICIAN        Additional Comment:    _______________________________________________ Lilly Cove, LCSW 07/14/2014, 12:03 PM

## 2014-07-16 ENCOUNTER — Encounter: Payer: Self-pay | Admitting: Adult Health

## 2014-07-16 ENCOUNTER — Non-Acute Institutional Stay (SKILLED_NURSING_FACILITY): Payer: Medicare Other | Admitting: Adult Health

## 2014-07-16 DIAGNOSIS — F32A Depression, unspecified: Secondary | ICD-10-CM

## 2014-07-16 DIAGNOSIS — I10 Essential (primary) hypertension: Secondary | ICD-10-CM

## 2014-07-16 DIAGNOSIS — I739 Peripheral vascular disease, unspecified: Secondary | ICD-10-CM

## 2014-07-16 DIAGNOSIS — E1122 Type 2 diabetes mellitus with diabetic chronic kidney disease: Secondary | ICD-10-CM | POA: Diagnosis not present

## 2014-07-16 DIAGNOSIS — E43 Unspecified severe protein-calorie malnutrition: Secondary | ICD-10-CM | POA: Diagnosis not present

## 2014-07-16 DIAGNOSIS — N183 Chronic kidney disease, stage 3 unspecified: Secondary | ICD-10-CM

## 2014-07-16 DIAGNOSIS — S81801S Unspecified open wound, right lower leg, sequela: Secondary | ICD-10-CM | POA: Diagnosis not present

## 2014-07-16 DIAGNOSIS — K5900 Constipation, unspecified: Secondary | ICD-10-CM | POA: Diagnosis not present

## 2014-07-16 DIAGNOSIS — N189 Chronic kidney disease, unspecified: Secondary | ICD-10-CM

## 2014-07-16 DIAGNOSIS — R609 Edema, unspecified: Secondary | ICD-10-CM | POA: Diagnosis not present

## 2014-07-16 DIAGNOSIS — E559 Vitamin D deficiency, unspecified: Secondary | ICD-10-CM

## 2014-07-16 DIAGNOSIS — E038 Other specified hypothyroidism: Secondary | ICD-10-CM

## 2014-07-16 DIAGNOSIS — F329 Major depressive disorder, single episode, unspecified: Secondary | ICD-10-CM | POA: Diagnosis not present

## 2014-07-16 NOTE — Progress Notes (Signed)
Patient ID: Sandra [name], female   DOB: [birth year], 81 y.o.   MRN: [id number]   07/16/2014  Facility:  Nursing Home Location:  [place] Room Number: 907-P LEVEL OF CARE:  SNF (31)   Chief Complaint  Patient presents with  . Hospitalization Follow-up    PVD with with nonhealing wound S/P Left AKA, chronic kidney disease stage III, diabetes mellitus, hypothyroidism, hypertension, depression and edema    HISTORY OF PRESENT ILLNESS:  This is an 81 year old female  who has been admitted to [facility] on 07/14/14 from [facility]. She has PVD with nonhealing wound for which she had left AKA on 07/06/14. She has CKD stage 3 and latest creatinine is 1.78. Her Losartan was discontinued due to CKD per nephrology consult. She is on Torsemide for edema of right AKA stump, abdominal wall.  Noted her albumin is 2.3, will do RD consult.  She has been admitted for a short-term rehabilitation.  PAST MEDICAL HISTORY:  Past Medical History  Diagnosis Date  . Anemia   . CAD (coronary artery disease)   . Peripheral vascular disease   . Hypertension   . Cancer     colon; s/p colectomy 2007  . Anemia, iron deficiency 01/17/2012  . Hemorrhoids   . Nosebleed   . Stroke   . Cellulitis 03/08/12  . Diabetes mellitus     IDDM ; Type 2 for 54 years  . Hypothyroidism     since childhood  . Shortness of breath dyspnea   . Depression   . Wound infection     on right foot   . Diabetic foot ulcers   . RBBB (right bundle branch block with left anterior fascicular block)   . Chronic kidney disease   . Edema of both legs     CURRENT MEDICATIONS: Reviewed per MAR/see medication list  Allergies  Allergen Reactions  . Blackberry [Rubus Fruticosus]     Can't take because of Cancer  . Meloxicam Shortness Of Breath  . Procaine Hcl Swelling  . Alendronate Sodium Other (See Comments)    constipation  . Meloxicam      REVIEW OF SYSTEMS:  GENERAL: no change in  appetite, no fatigue, no weight changes, no fever, chills or weakness RESPIRATORY: no cough, SOB, DOE, wheezing, hemoptysis CARDIAC: no chest pain, edema or palpitations GI: no abdominal pain, diarrhea, heart burn, nausea or vomiting, +constipation  PHYSICAL EXAMINATION  GENERAL: no acute distress, obese EYES: conjunctivae normal, sclerae normal, normal eye lids NECK: supple, trachea midline, no neck masses, no thyroid tenderness, no thyromegaly LYMPHATICS: no LAN in the neck, no supraclavicular LAN RESPIRATORY: breathing is even & unlabored, BS CTAB CARDIAC: RRR, no murmur,no extra heart sounds, no edema GI: abdomen soft, normal BS, no masses, no tenderness, no hepatomegaly, no splenomegaly EXTREMITIES:  Able to move all 4 extremities; right AKA stump has staples intact; left lower leg has kerlex and ACE wrap PSYCHIATRIC: the patient is alert & oriented to person, affect & behavior appropriate  LABS/RADIOLOGY: Labs reviewed: Basic Metabolic Panel:  Recent Labs  07/11/14 0450 07/13/14 0518 07/14/14 0353  NA 134* 137 136  K 4.8 4.2 4.0  CL 104 103 100*  CO2 21* 25 25  GLUCOSE 94 108* 116*  BUN 70* 78* 82*  CREATININE 2.32* 1.83* 1.78*  CALCIUM 8.1* 8.5* 8.7*  PHOS  --  4.3 4.2   Liver Function Tests:  Recent Labs  07/08/14 0406 07/09/14 0555 07/10/14  0413 07/13/14 0518 07/14/14 0353  AST 19 16 15   --   --   ALT 12 12 11   --   --   ALKPHOS 89 94 100  --   --   BILITOT 0.5 0.4 0.4  --   --   PROT 5.8* 6.2 6.1  --   --   ALBUMIN 2.2* 2.3* 2.2* 2.2* 2.3*   CBC:  Recent Labs  01/12/14 0625 01/13/14 0645 01/14/14 0728  07/08/14 0406 07/09/14 0555 07/10/14 0413  WBC 6.1 6.7 6.6  < > 8.8 7.2 5.9  NEUTROABS 4.0 4.4 4.4  --   --   --   --   HGB 10.4* 10.1* 10.3*  < > 9.0* 8.9* 9.0*  HCT 32.4* 31.1* 32.0*  < > 29.1* 28.1* 28.5*  MCV 87.6 87.9 88.4  < > 90.9 90.1 89.3  PLT 234 229 213  < > 160 171 173  < > = values in this interval not displayed.  Lipid  Panel:  Recent Labs  10/30/13 1415  HDL 50.30   CBG:  Recent Labs  07/13/14 2029 07/14/14 0623 07/14/14 1131  GLUCAP 138* 102* 151*    Dg Chest 2 View  07/07/2014   CLINICAL DATA:  82 year old female with a history of chest pain and shortness of breath.  EXAM: CHEST - 2 VIEW  COMPARISON:  03/03/2014, 12/11/2013, 03/08/2012  FINDINGS: Cardiomediastinal silhouette unchanged. Atherosclerotic calcifications of the aortic arch.  Airspace opacities at the bilateral lung bases with associated interstitial opacities. Blunting of left costophrenic angle.  Calcifications of the mitral annulus.  Moderate pleural effusion on the lateral view.  No pneumothorax.  No displaced fracture.  Unremarkable appearance of the upper abdomen.  Vague opacities overlying the chest likely external to the patient.  IMPRESSION: Interval development of bilateral pleural effusions and airspace disease, potentially representing edema and/or pneumonia.  Atherosclerosis and mitral annular calcifications.  Signed,  Dulcy Fanny. Earleen Newport, DO  [facility]  [facility] Rad[facility]   Electronically Signed   By: Corrie Mckusick D.O.   On: 07/07/2014 15:33   US Renal  07/09/2014   CLINICAL DATA:  Acute kidney injury  EXAM: RENAL / URINARY TRACT ULTRASOUND COMPLETE  COMPARISON:  09/14/2013  FINDINGS: Right Kidney:  Length: 11 cm. Increased cortical echogenicity diffusely with borderline cortical thinning. Two simple appearing cysts present, 10 mm in the upper pole and 12 mm in the interpolar region.  Left Kidney:  Limited visualization due to narrow sonographic windows.  Length: 11 cm.  No hydronephrosis or gross mass lesion.  Bladder:  Moderately distended.  No wall thickening.  Left pleural effusion and small left upper quadrant ascites.  IMPRESSION: 1. No hydronephrosis. 2. Medical renal disease. 3. Limited visualization of the left kidney. 4. Left pleural effusion and small ascites.   Electronically Signed    By: Monte Fantasia M.D.   On: 07/09/2014 09:33    ASSESSMENT/PLAN:  PVD with nonhealing wound of right foot S/P AKA -  Follow-up with Dr. Scot Dock, vascular, in 4 weeks CKD stage 3 - creatinine 1.78; will monitor; follow-up with Dr. Florene Glen; nephrology DM with renal complications - YFVC9S 6.1; continue Lantus 30 units SQ Q HS Hypothyroidism - continue Synthroid 125 mcg daily Hypertension - well-controlled; continue Norvasc 5 mg PO Q D; Losartan will be held till creatinine is back to 1.5 per renal consult Depression - mood is stable; continue Prozac 20 mg PO Q D Edema - continue Demadex 40 mg PO  BID Vitamin D deficiency - continue Vitamin D 5,000 units PO Q D Protein-calorie malnutrition, severe - albumin 2.3; RD consult Constipation -  Start Senna-s 2 tabs PO BID and Miralax 17 gm PO BID    Goals of care:  Short-term rehabilitation   Labs/test ordered:  For weekly BMP, vitamin D, Pth, CBC and tsh  Spent 50 minutes in patient care.    [facility], NP [employer] [phone]

## 2014-07-20 ENCOUNTER — Non-Acute Institutional Stay (SKILLED_NURSING_FACILITY): Payer: Medicare Other | Admitting: Internal Medicine

## 2014-07-20 DIAGNOSIS — E559 Vitamin D deficiency, unspecified: Secondary | ICD-10-CM

## 2014-07-20 DIAGNOSIS — I129 Hypertensive chronic kidney disease with stage 1 through stage 4 chronic kidney disease, or unspecified chronic kidney disease: Secondary | ICD-10-CM

## 2014-07-20 DIAGNOSIS — E1122 Type 2 diabetes mellitus with diabetic chronic kidney disease: Secondary | ICD-10-CM

## 2014-07-20 DIAGNOSIS — I70209 Unspecified atherosclerosis of native arteries of extremities, unspecified extremity: Secondary | ICD-10-CM

## 2014-07-20 DIAGNOSIS — F329 Major depressive disorder, single episode, unspecified: Secondary | ICD-10-CM

## 2014-07-20 DIAGNOSIS — K5901 Slow transit constipation: Secondary | ICD-10-CM

## 2014-07-20 DIAGNOSIS — E1159 Type 2 diabetes mellitus with other circulatory complications: Secondary | ICD-10-CM

## 2014-07-20 DIAGNOSIS — R2681 Unsteadiness on feet: Secondary | ICD-10-CM

## 2014-07-20 DIAGNOSIS — L98499 Non-pressure chronic ulcer of skin of other sites with unspecified severity: Secondary | ICD-10-CM | POA: Diagnosis not present

## 2014-07-20 DIAGNOSIS — E039 Hypothyroidism, unspecified: Secondary | ICD-10-CM | POA: Diagnosis not present

## 2014-07-20 DIAGNOSIS — E1151 Type 2 diabetes mellitus with diabetic peripheral angiopathy without gangrene: Secondary | ICD-10-CM

## 2014-07-20 DIAGNOSIS — I739 Peripheral vascular disease, unspecified: Secondary | ICD-10-CM | POA: Diagnosis not present

## 2014-07-20 DIAGNOSIS — N183 Chronic kidney disease, stage 3 (moderate): Secondary | ICD-10-CM

## 2014-07-20 DIAGNOSIS — D62 Acute posthemorrhagic anemia: Secondary | ICD-10-CM

## 2014-07-20 DIAGNOSIS — F32A Depression, unspecified: Secondary | ICD-10-CM

## 2014-07-20 NOTE — Progress Notes (Signed)
Patient ID: Sandra [name], female   DOB: November 22, 1930, 81 y.o.   MRN: [id number]     [facility]   PCP:  Melinda Crutch, MD  Code Status: full code  Allergies  Allergen Reactions  . Blackberry [Rubus Fruticosus]     Can't take because of Cancer  . Meloxicam Shortness Of Breath  . Procaine Hcl Swelling  . Alendronate Sodium Other (See Comments)    constipation  . Meloxicam     Chief Complaint  Patient presents with  . New Admit To SNF     HPI:  81 year old patient is here for short term rehabilitation post hospital admission from 07/06/14-07/14/14 with non healing wound in her right foot. She underwent right above knee amputation. She has history fo PVD, CKD stage 3, HTN, DM type 2, anemia She is seen in her room today. She is sitting on a wheelchair. She is in no distress. Mentions that her pain is under control. She feels tired. No other concerns.    Review of Systems:  Constitutional: Negative for fever, chills, diaphoresis.  HENT: Negative for headache, congestion, nasal discharge Eyes: Negative for eye pain, blurred vision, double vision and discharge.  Respiratory: Negative for cough, shortness of breath and wheezing.   Cardiovascular: Negative for chest pain, palpitations, leg swelling.  Gastrointestinal: Negative for heartburn, nausea, vomiting, abdominal pain. Had bowel movement yesterday Genitourinary: Negative for dysuria Musculoskeletal: Negative for back pain, falls Skin: Negative for itching, rash.  Neurological: Negative for dizziness, tingling, focal weakness Psychiatric/Behavioral: Negative for depression   Past Medical History  Diagnosis Date  . Anemia   . CAD (coronary artery disease)   . Peripheral vascular disease   . Hypertension   . Cancer     colon; s/p colectomy 2007  . Anemia, iron deficiency 01/17/2012  . Hemorrhoids   . Nosebleed   . Stroke   . Cellulitis 03/08/12  . Diabetes mellitus     IDDM ; Type 2 for 54  years  . Hypothyroidism     since childhood  . Shortness of breath dyspnea   . Depression   . Wound infection     on right foot   . Diabetic foot ulcers   . RBBB (right bundle branch block with left anterior fascicular block)   . Chronic kidney disease   . Edema of both legs    Past Surgical History  Procedure Laterality Date  . Appendectomy    . Cholecystectomy    . Tonsilectomy, adenoidectomy, bilateral myringotomy and tubes    . Colon surgery      for colon CA  . Lower extremity angiogram  September 01, 2012  . Aortogram  September 01, 2012  . Abdominal aortagram N/A 09/01/2012    Procedure: ABDOMINAL Maxcine Ham;  Surgeon: Angelia Mould, MD;  Location: [facility] CATH LAB;  Service: Cardiovascular;  Laterality: N/A;  . Lower extremity angiogram Bilateral 09/01/2012    Procedure: LOWER EXTREMITY ANGIOGRAM;  Surgeon: Angelia Mould, MD;  Location: [facility] CATH LAB;  Service: Cardiovascular;  Laterality: Bilateral;  . Amputation Right 07/06/2014    Procedure: RIGHT  ABOVE THE KNEE AMPUTATION;  Surgeon: Angelia Mould, MD;  Location: [place];  Service: Vascular;  Laterality: Right;   Social History:   reports that she quit smoking about 47 years ago. Her smoking use included Cigarettes. She started smoking about 59 years ago. She has a 13 pack-year smoking history. She has never used smokeless tobacco. She  reports that she does not drink alcohol or use illicit drugs.  Family History  Problem Relation Age of Onset  . Cancer Mother   . Diabetes Mother   . Other Father     bleeding problems  . Diabetes Sister   . Heart disease Daughter     before age 20  . Cancer Son     Medications: Patient's Medications  New Prescriptions   No medications on file  Previous Medications   AMLODIPINE (NORVASC) 5 MG TABLET    Take 5 mg by mouth daily.   ASPIRIN EC 81 MG TABLET    Take 81 mg by mouth every evening.   BD PEN NEEDLE NANO U/F 32G X 4 MM MISC       CHOLECALCIFEROL (VITAMIN D) 1000  UNITS TABLET    Take 5 tablets (5,000 Units total) by mouth daily.   FLUOXETINE (PROZAC) 20 MG TABLET    Take 20 mg by mouth daily.   INSULIN GLARGINE (LANTUS SOLOSTAR) 100 UNIT/ML SOLOSTAR PEN    Inject 30 Units into the skin daily at 10 pm. 40 units   LEVOTHYROXINE (SYNTHROID, LEVOTHROID) 125 MCG TABLET    Take 125 mcg by mouth. Synthroid ONlY   NYSTATIN (MYCOSTATIN) POWDER    Apply topically 2 (two) times daily.    OXYCODONE (OXY IR/ROXICODONE) 5 MG IMMEDIATE RELEASE TABLET    Take 1 tablet (5 mg total) by mouth every 6 (six) hours as needed for moderate pain.   PRODIGY NO CODING BLOOD GLUC TEST STRIP       TORSEMIDE (DEMADEX) 20 MG TABLET    Take 40 mg by mouth 2 (two) times daily.  Modified Medications   No medications on file  Discontinued Medications   No medications on file     Physical Exam: Filed Vitals:   07/20/14 1809  BP: 146/64  Pulse: 68  Temp: 99 F (37.2 C)  Resp: 18  Weight: 200 lb 8 oz (90.946 kg)  SpO2: 96%    General- elderly female, obese, in no acute distress Head- normocephalic, atraumatic Throat- moist mucus membrane Eyes- PERRLA, EOMI, no pallor, no icterus, no discharge, normal conjunctiva, normal sclera Neck- no cervical lymphadenopathy Cardiovascular- normal s1,s2, no murmurs, palpable dorsalis pedis, left leg edema Respiratory- bilateral clear to auscultation, no wheeze, no rhonchi, no crackles, no use of accessory muscles Abdomen- bowel sounds present, soft, non tender Musculoskeletal- right AKA, staples along the incision, mild peri-incisional erythema but no signs of infection, normal temperature to touch, LLE chronic stasis changes  Neurological- no focal deficit Skin- warm and dry, pannus with mild erythema underneath and has nystatin powder Psychiatry- alert and oriented to person, place and time, normal mood and affect    Labs reviewed: Basic Metabolic Panel:  Recent Labs  07/11/14 0450 07/13/14 0518 07/14/14 0353  NA 134* 137 136   K 4.8 4.2 4.0  CL 104 103 100*  CO2 21* 25 25  GLUCOSE 94 108* 116*  BUN 70* 78* 82*  CREATININE 2.32* 1.83* 1.78*  CALCIUM 8.1* 8.5* 8.7*  PHOS  --  4.3 4.2   Liver Function Tests:  Recent Labs  07/08/14 0406 07/09/14 0555 07/10/14 0413 07/13/14 0518 07/14/14 0353  AST 19 16 15   --   --   ALT 12 12 11   --   --   ALKPHOS 89 94 100  --   --   BILITOT 0.5 0.4 0.4  --   --   PROT 5.8* 6.2 6.1  --   --  ALBUMIN 2.2* 2.3* 2.2* 2.2* 2.3*   No results for input(s): LIPASE, AMYLASE in the last 8760 hours. No results for input(s): AMMONIA in the last 8760 hours. CBC:  Recent Labs  01/12/14 0625 01/13/14 0645 01/14/14 0728  07/08/14 0406 07/09/14 0555 07/10/14 0413  WBC 6.1 6.7 6.6  < > 8.8 7.2 5.9  NEUTROABS 4.0 4.4 4.4  --   --   --   --   HGB 10.4* 10.1* 10.3*  < > 9.0* 8.9* 9.0*  HCT 32.4* 31.1* 32.0*  < > 29.1* 28.1* 28.5*  MCV 87.6 87.9 88.4  < > 90.9 90.1 89.3  PLT 234 229 213  < > 160 171 173  < > = values in this interval not displayed. Cardiac Enzymes: No results for input(s): CKTOTAL, CKMB, CKMBINDEX, TROPONINI in the last 8760 hours. BNP: Invalid input(s): POCBNP CBG:  Recent Labs  07/13/14 2029 07/14/14 0623 07/14/14 1131  GLUCAP 138* 102* 151*    Assessment/Plan  PVD  with nonhealing wound of right foot S/P AKA. Denies pain. Continue skin care. Has follow up with vascular team. Will have patient work with PT/OT as tolerated to regain strength and restore function.  Fall precautions are in place. Continue demadex 40 mg bid for her edema. Monitor bmp. Continue aspirin. On prn oxycodone 5 mg for pain  Unsteady gait Post right AKA. Will need PT and OT to work on gait training, fitting of prosthesis over time. Fall precautions  Hypertension  Mildly elevated sbp today. continue Norvasc 5 mg daily for now. Losartan on hold with impaired renal function. Monitor bp q shift with goal < 140/90. If elevated, adjust bp medication  Dm type 2 with vascular  complications Q2I 6.1 continue lantus 30 u daily, monitor cbg  CKD stage 3  Monitor renal function. No furosemide or losartan for now  Constipation Stable, continue senna s bid and miralax bid for now  Blood loss anemia Post op along with anemia of chronic disease. Monitor h&h  Hypothyroidism continue Synthroid 125 mcg daily  Depression continue Prozac 20 mg daily  Vitamin D deficiency continue Vitamin D 5,000 units daily   Goals of care: short term rehabilitation   Family/ staff Communication: reviewed care plan with patient and nursing supervisor    Blanchie Serve, MD  [facility] [phone] (Monday-Friday 8 am - 5 pm) [phone] (afterhours)

## 2014-08-02 ENCOUNTER — Encounter: Payer: Self-pay | Admitting: Vascular Surgery

## 2014-08-03 ENCOUNTER — Encounter: Payer: Self-pay | Admitting: Vascular Surgery

## 2014-08-04 ENCOUNTER — Encounter: Payer: Self-pay | Admitting: Vascular Surgery

## 2014-08-04 ENCOUNTER — Ambulatory Visit (INDEPENDENT_AMBULATORY_CARE_PROVIDER_SITE_OTHER): Payer: Medicare Other | Admitting: Vascular Surgery

## 2014-08-04 VITALS — BP 167/31 | HR 59 | Ht 61.0 in | Wt 200.0 lb

## 2014-08-04 DIAGNOSIS — Z48812 Encounter for surgical aftercare following surgery on the circulatory system: Secondary | ICD-10-CM

## 2014-08-04 NOTE — Progress Notes (Signed)
   Patient name: Sandra [name] MRN: [id number] DOB: 01/18/1931 Sex: female  REASON FOR VISIT: follow up after right AKA  HPI: Sandra [name] is a 81 y.o. female who presented with a nonhealing wound of the right lower extremity. She was not a candidate for revascularization. She underwent a right above-the-knee amputation on 07/06/2014. She comes in for an outpatient visit. She has no specific complaints.  Current Outpatient Prescriptions  Medication Sig Dispense Refill  . amLODipine (NORVASC) 5 MG tablet Take 5 mg by mouth daily.    Marland Kitchen aspirin EC 81 MG tablet Take 81 mg by mouth every evening.    . BD PEN NEEDLE NANO U/F 32G X 4 MM MISC     . cholecalciferol (VITAMIN D) 1000 UNITS tablet Take 5 tablets (5,000 Units total) by mouth daily.    Marland Kitchen FLUoxetine (PROZAC) 20 MG tablet Take 20 mg by mouth daily.    . Insulin Glargine (LANTUS SOLOSTAR) 100 UNIT/ML Solostar Pen Inject 30 Units into the skin daily at 10 pm. 40 units 15 mL 11  . levothyroxine (SYNTHROID, LEVOTHROID) 125 MCG tablet Take 125 mcg by mouth. Synthroid ONlY    . nystatin (MYCOSTATIN) powder Apply topically 2 (two) times daily.     Marland Kitchen oxyCODONE (OXY IR/ROXICODONE) 5 MG immediate release tablet Take 1 tablet (5 mg total) by mouth every 6 (six) hours as needed for moderate pain. 30 tablet 0  . PRODIGY NO CODING BLOOD GLUC test strip     . torsemide (DEMADEX) 20 MG tablet Take 40 mg by mouth 2 (two) times daily.     No current facility-administered medications for this visit.   REVIEW OF SYSTEMS: Valu.Nieves ] denotes positive finding; [  ] denotes negative finding  CARDIOVASCULAR:  [ ]  chest pain   [ ]  dyspnea on exertion    CONSTITUTIONAL:  [ ]  fever   [ ]  chills  PHYSICAL EXAM: Filed Vitals:   08/04/14 0910  BP: 156/50  Pulse: 59  Height: 5\' 1"  (1.549 m)  Weight: 200 lb (90.719 kg)  SpO2: 92%   GENERAL: The patient is a well-nourished female, in no acute distress. The vital signs are documented above. CARDIOVASCULAR: There is a  regular rate and rhythm. PULMONARY: There is good air exchange bilaterally without wheezing or rales. Her right AKA is healing nicely. There are no wounds on the left foot.  MEDICAL ISSUES: The patient is doing well status post right AKA. I have ordered follow up ABIs and an office visit with the nurse practitioner in 6 months. She knows to call sooner she has problems.  Deitra Mayo [facility]: [phone]

## 2014-08-16 ENCOUNTER — Non-Acute Institutional Stay (SKILLED_NURSING_FACILITY): Payer: Medicare Other | Admitting: Adult Health

## 2014-08-16 ENCOUNTER — Encounter: Payer: Self-pay | Admitting: Adult Health

## 2014-08-16 DIAGNOSIS — I739 Peripheral vascular disease, unspecified: Secondary | ICD-10-CM | POA: Diagnosis not present

## 2014-08-16 DIAGNOSIS — E1122 Type 2 diabetes mellitus with diabetic chronic kidney disease: Secondary | ICD-10-CM

## 2014-08-16 DIAGNOSIS — E559 Vitamin D deficiency, unspecified: Secondary | ICD-10-CM | POA: Diagnosis not present

## 2014-08-16 DIAGNOSIS — N183 Chronic kidney disease, stage 3 unspecified: Secondary | ICD-10-CM

## 2014-08-16 DIAGNOSIS — S81801S Unspecified open wound, right lower leg, sequela: Secondary | ICD-10-CM

## 2014-08-16 DIAGNOSIS — R609 Edema, unspecified: Secondary | ICD-10-CM

## 2014-08-16 DIAGNOSIS — E43 Unspecified severe protein-calorie malnutrition: Secondary | ICD-10-CM | POA: Diagnosis not present

## 2014-08-16 DIAGNOSIS — N189 Chronic kidney disease, unspecified: Secondary | ICD-10-CM

## 2014-08-16 DIAGNOSIS — K5900 Constipation, unspecified: Secondary | ICD-10-CM

## 2014-08-16 DIAGNOSIS — F329 Major depressive disorder, single episode, unspecified: Secondary | ICD-10-CM | POA: Diagnosis not present

## 2014-08-16 DIAGNOSIS — E039 Hypothyroidism, unspecified: Secondary | ICD-10-CM

## 2014-08-16 DIAGNOSIS — I10 Essential (primary) hypertension: Secondary | ICD-10-CM

## 2014-08-16 DIAGNOSIS — F32A Depression, unspecified: Secondary | ICD-10-CM

## 2014-08-16 NOTE — Progress Notes (Addendum)
Patient ID: Sandra [name], female   DOB: [birth year], 81 y.o.   MRN: [id number]   08/16/2014  Facility:  Nursing Home Location:  [place] Room Number: 907-P LEVEL OF CARE:  SNF (31)   Chief Complaint  Patient presents with  . Discharge Note    PVD with with nonhealing wound S/P Left AKA, chronic kidney disease stage III, diabetes mellitus, hypothyroidism, hypertension, depression and edema    HISTORY OF PRESENT ILLNESS:  This is an 81 year old female  who is for discharge home with Home health PT, OT, CNA and Nursing. DME: Standard wheelchair with elevating leg rests , anti-tippers, cushion, hospital bed and sliding transfer board. She has been admitted to [facility] on 07/14/14 from [facility]. She has PVD with nonhealing wound for which she had left AKA on 07/06/14. She has CKD stage 3 and latest creatinine is 1.78. Her Losartan was discontinued due to CKD per nephrology consult. She is on Torsemide for edema of right AKA stump and abdominal wall.   Patient was admitted to this facility for short-term rehabilitation after the patient's recent hospitalization.  Patient has completed SNF rehabilitation and therapy has cleared the patient for discharge.  PAST MEDICAL HISTORY:  Past Medical History  Diagnosis Date  . Anemia   . CAD (coronary artery disease)   . Peripheral vascular disease   . Hypertension   . Cancer     colon; s/p colectomy 2007  . Anemia, iron deficiency 01/17/2012  . Hemorrhoids   . Nosebleed   . Stroke   . Cellulitis 03/08/12  . Diabetes mellitus     IDDM ; Type 2 for 54 years  . Hypothyroidism     since childhood  . Shortness of breath dyspnea   . Depression   . Wound infection     on right foot   . Diabetic foot ulcers   . RBBB (right bundle branch block with left anterior fascicular block)   . Chronic kidney disease   . Edema of both legs     CURRENT MEDICATIONS: Reviewed per MAR/see medication list  Allergies    Allergen Reactions  . Blackberry [Rubus Fruticosus]     Can't take because of Cancer  . Meloxicam Shortness Of Breath  . Procaine Hcl Swelling  . Alendronate Sodium Other (See Comments)    constipation  . Meloxicam      REVIEW OF SYSTEMS:  GENERAL: no change in appetite, no fatigue, no weight changes, no fever, chills or weakness RESPIRATORY: no cough, SOB, DOE, wheezing, hemoptysis CARDIAC: no chest pain, or palpitations GI: no abdominal pain, diarrhea, heart burn, nausea or vomiting  PHYSICAL EXAMINATION  GENERAL: no acute distress, obese NECK: supple, trachea midline, no neck masses, no thyroid tenderness, no thyromegaly LYMPHATICS: no LAN in the neck, no supraclavicular LAN RESPIRATORY: breathing is even & unlabored, BS CTAB CARDIAC: RRR, no murmur,no extra heart sounds, no edema GI: abdomen soft, normal BS, no masses, no tenderness, no hepatomegaly, no splenomegaly EXTREMITIES:  Able to move all 4 extremities; right AKA stump wound has healed, dry PSYCHIATRIC: the patient is alert & oriented to person, affect & behavior appropriate  LABS/RADIOLOGY: Labs reviewed: 07/20/14  parathyroid hormone 69 calcium 8.5 07/19/14  WBC 7.6 hemoglobin 9.9 hematocrit 31.1 MCV 88.9 TSH 3.121 platelet 017 Basic Metabolic Panel:  Recent Labs  07/11/14 0450 07/13/14 0518 07/14/14 0353  NA 134* 137 136  K 4.8 4.2 4.0  CL 104 103 100*  CO2 21* 25 25  GLUCOSE 94 108* 116*  BUN 70* 78* 82*  CREATININE 2.32* 1.83* 1.78*  CALCIUM 8.1* 8.5* 8.7*  PHOS  --  4.3 4.2   Liver Function Tests:  Recent Labs  07/08/14 0406 07/09/14 0555 07/10/14 0413 07/13/14 0518 07/14/14 0353  AST 19 16 15   --   --   ALT 12 12 11   --   --   ALKPHOS 89 94 100  --   --   BILITOT 0.5 0.4 0.4  --   --   PROT 5.8* 6.2 6.1  --   --   ALBUMIN 2.2* 2.3* 2.2* 2.2* 2.3*   CBC:  Recent Labs  01/12/14 0625 01/13/14 0645 01/14/14 0728  07/08/14 0406 07/09/14 0555 07/10/14 0413  WBC 6.1 6.7 6.6  < >  8.8 7.2 5.9  NEUTROABS 4.0 4.4 4.4  --   --   --   --   HGB 10.4* 10.1* 10.3*  < > 9.0* 8.9* 9.0*  HCT 32.4* 31.1* 32.0*  < > 29.1* 28.1* 28.5*  MCV 87.6 87.9 88.4  < > 90.9 90.1 89.3  PLT 234 229 213  < > 160 171 173  < > = values in this interval not displayed.  Lipid Panel:  Recent Labs  10/30/13 1415  HDL 50.30   CBG:  Recent Labs  07/13/14 2029 07/14/14 0623 07/14/14 1131  GLUCAP 138* 102* 151*    No results found.  ASSESSMENT/PLAN:  PVD with nonhealing wound of right foot S/P AKA -  Follow-up with Dr. Scot Dock, vascular CKD stage 3 - creatinine 1.78; will monitor; follow-up with Dr. Florene Glen; nephrology DM with renal complications - [id number] 6.1; continue Lantus 30 units SQ Q HS Hypothyroidism - tsh 3.121; continue Synthroid 125 mcg daily Hypertension - well-controlled; continue Norvasc 5 mg PO Q D; Losartan will be held till creatinine is back to 1.5 per renal consult Depression - mood is stable; continue Prozac 20 mg PO Q D Edema -  decrease Demadex to 20 mg 1 tab PO BID Vitamin D deficiency - continue Vitamin D 5,000 units PO Q D Protein-calorie malnutrition, severe - albumin 2.3; continue supplementation Constipation -  continue Senna-s 2 tabs PO BID and Miralax 17 gm PO BID    I have filled out patient's discharge paperwork and written prescriptions.  Patient will receive home health PT, OT,Nursing and CNA.  DME provided:  Standard wheelchair with elevating legrests , anti-tippers, cushion, hospital bed and sliding transfer board  Total discharge time: Greater than 30 minutes  Discharge time involved coordination of the discharge process with social worker, nursing staff and therapy department. Medical justification for home health services/DME verified.    [facility], NP [employer] [phone]

## 2014-08-16 NOTE — Addendum Note (Signed)
Addended by: Durenda Age C on: 08/16/2014 05:22 PM   Modules accepted: Orders, Medications

## 2014-09-20 ENCOUNTER — Non-Acute Institutional Stay (SKILLED_NURSING_FACILITY): Payer: Medicare Other | Admitting: Adult Health

## 2014-09-20 ENCOUNTER — Encounter: Payer: Self-pay | Admitting: Adult Health

## 2014-09-20 DIAGNOSIS — R609 Edema, unspecified: Secondary | ICD-10-CM

## 2014-09-20 DIAGNOSIS — E559 Vitamin D deficiency, unspecified: Secondary | ICD-10-CM

## 2014-09-20 DIAGNOSIS — S81801S Unspecified open wound, right lower leg, sequela: Secondary | ICD-10-CM

## 2014-09-20 DIAGNOSIS — F329 Major depressive disorder, single episode, unspecified: Secondary | ICD-10-CM | POA: Diagnosis not present

## 2014-09-20 DIAGNOSIS — E1122 Type 2 diabetes mellitus with diabetic chronic kidney disease: Secondary | ICD-10-CM

## 2014-09-20 DIAGNOSIS — E039 Hypothyroidism, unspecified: Secondary | ICD-10-CM | POA: Diagnosis not present

## 2014-09-20 DIAGNOSIS — N189 Chronic kidney disease, unspecified: Secondary | ICD-10-CM

## 2014-09-20 DIAGNOSIS — I10 Essential (primary) hypertension: Secondary | ICD-10-CM | POA: Diagnosis not present

## 2014-09-20 DIAGNOSIS — K5900 Constipation, unspecified: Secondary | ICD-10-CM

## 2014-09-20 DIAGNOSIS — N183 Chronic kidney disease, stage 3 unspecified: Secondary | ICD-10-CM

## 2014-09-20 DIAGNOSIS — I739 Peripheral vascular disease, unspecified: Secondary | ICD-10-CM | POA: Diagnosis not present

## 2014-09-20 DIAGNOSIS — R42 Dizziness and giddiness: Secondary | ICD-10-CM | POA: Diagnosis not present

## 2014-09-20 DIAGNOSIS — E43 Unspecified severe protein-calorie malnutrition: Secondary | ICD-10-CM

## 2014-09-20 DIAGNOSIS — F32A Depression, unspecified: Secondary | ICD-10-CM

## 2014-09-21 LAB — HEMOGLOBIN A1C: HEMOGLOBIN A1C: 6.8

## 2014-10-10 NOTE — Progress Notes (Signed)
Patient ID: Sandra [name], female   DOB: 02-27-1931, 81 y.o.   MRN: [id number]   09/20/14  Facility:  Nursing Home Location:  [place] Room Number: 907-P LEVEL OF CARE:  SNF (31)   Chief Complaint  Patient presents with  . Medical Management of Chronic Issues    PVD with with nonhealing wound S/P Left AKA, chronic kidney disease stage III, diabetes mellitus, hypothyroidism, hypertension, depression, Vertigo and edema    HISTORY OF PRESENT ILLNESS:  This is an 81 year old female  who was supposed to have been discharged last month but stayed @ [place] as long-term care resident.   She has been admitted to [facility] on 07/14/14 from [facility] [place]. She has PVD with nonhealing wound for which she had left AKA on 07/06/14. She has CKD stage 3 and latest creatinine is 1.42. She is on Demadex for edema of right AKA stump and abdominal wall.   PAST MEDICAL HISTORY:  Past Medical History  Diagnosis Date  . Anemia   . CAD (coronary artery disease)   . Peripheral vascular disease   . Hypertension   . Cancer     colon; s/p colectomy 2007  . Anemia, iron deficiency 01/17/2012  . Hemorrhoids   . Nosebleed   . Stroke   . Cellulitis 03/08/12  . Diabetes mellitus     IDDM ; Type 2 for 54 years  . Hypothyroidism     since childhood  . Shortness of breath dyspnea   . Depression   . Wound infection     on right foot   . Diabetic foot ulcers   . RBBB (right bundle branch block with left anterior fascicular block)   . Chronic kidney disease   . Edema of both legs     CURRENT MEDICATIONS: Reviewed per MAR/see medication list  Allergies  Allergen Reactions  . Blackberry [Rubus Fruticosus]     Can't take because of Cancer  . Meloxicam Shortness Of Breath  . Procaine Hcl Swelling  . Alendronate Sodium Other (See Comments)    constipation  . Meloxicam      REVIEW OF SYSTEMS:  GENERAL: no change in appetite, no fatigue, no weight changes,  no fever, chills or weakness RESPIRATORY: no cough, SOB, DOE, wheezing, hemoptysis CARDIAC: no chest pain, or palpitations GI: no abdominal pain, diarrhea, heart burn, nausea or vomiting  PHYSICAL EXAMINATION  GENERAL: no acute distress, obese NECK: supple, trachea midline, no neck masses, no thyroid tenderness, no thyromegaly LYMPHATICS: no LAN in the neck, no supraclavicular LAN RESPIRATORY: breathing is even & unlabored, BS CTAB CARDIAC: RRR, no murmur,no extra heart sounds, no edema GI: abdomen soft, normal BS, no masses, no tenderness, no hepatomegaly, no splenomegaly EXTREMITIES:  Able to move all 4 extremities; right AKA stump wound has healed, dry PSYCHIATRIC: the patient is alert & oriented to person, affect & behavior appropriate  LABS/RADIOLOGY: Labs reviewed: 07/20/14  parathyroid hormone 69 calcium 8.5 07/19/14  WBC 7.6 hemoglobin 9.9 hematocrit 31.1 MCV 88.9 TSH 3.121 platelet 409 Basic Metabolic Panel:  Recent Labs  07/11/14 0450 07/13/14 0518 07/14/14 0353  NA 134* 137 136  K 4.8 4.2 4.0  CL 104 103 100*  CO2 21* 25 25  GLUCOSE 94 108* 116*  BUN 70* 78* 82*  CREATININE 2.32* 1.83* 1.78*  CALCIUM 8.1* 8.5* 8.7*  PHOS  --  4.3 4.2   Liver Function Tests:  Recent Labs  07/08/14 0406 07/09/14 0555  07/10/14 0413 07/13/14 0518 07/14/14 0353  AST 19 16 15   --   --   ALT 12 12 11   --   --   ALKPHOS 89 94 100  --   --   BILITOT 0.5 0.4 0.4  --   --   PROT 5.8* 6.2 6.1  --   --   ALBUMIN 2.2* 2.3* 2.2* 2.2* 2.3*   CBC:  Recent Labs  01/12/14 0625 01/13/14 0645 01/14/14 0728  07/08/14 0406 07/09/14 0555 07/10/14 0413  WBC 6.1 6.7 6.6  < > 8.8 7.2 5.9  NEUTROABS 4.0 4.4 4.4  --   --   --   --   HGB 10.4* 10.1* 10.3*  < > 9.0* 8.9* 9.0*  HCT 32.4* 31.1* 32.0*  < > 29.1* 28.1* 28.5*  MCV 87.6 87.9 88.4  < > 90.9 90.1 89.3  PLT 234 229 213  < > 160 171 173  < > = values in this interval not displayed.  Lipid Panel:  Recent Labs  10/30/13 1415    HDL 50.30   CBG:  Recent Labs  07/13/14 2029 07/14/14 0623 07/14/14 1131  GLUCAP 138* 102* 151*    No results found.  ASSESSMENT/PLAN:  PVD with nonhealing wound of right foot S/P AKA -  Follow-up with Dr. Scot Dock, vascular CKD stage 3 - creatinine 1.42;  follow-up with Dr. Florene Glen; nephrology DM with renal complications - [id number] 6.1; continue Lantus 30 units SQ Q HS Hypothyroidism - tsh 3.121; continue Synthroid 125 mcg daily Hypertension - well-controlled; continue Norvasc 5 mg PO Q D Depression - mood is stable; continue Prozac 20 mg PO Q D Edema -  continue Demadex to 20 mg 1 tab PO BID Vitamin D deficiency - continue Vitamin D 5,000 units PO Q D Protein-calorie malnutrition, severe - albumin 2.3; continue supplementation Constipation -  continue Senna-s 2 tabs PO BID and Miralax 17 gm PO BID Vertigo - continue Meclizine 12.5 mg PO Q 8 hours PRN   Goals of care:  Long-term care   [facility], [facility] [phone]

## 2014-12-06 ENCOUNTER — Non-Acute Institutional Stay (SKILLED_NURSING_FACILITY): Payer: Medicare Other | Admitting: Adult Health

## 2014-12-06 DIAGNOSIS — I739 Peripheral vascular disease, unspecified: Secondary | ICD-10-CM

## 2014-12-06 DIAGNOSIS — K5900 Constipation, unspecified: Secondary | ICD-10-CM | POA: Diagnosis not present

## 2014-12-06 DIAGNOSIS — R609 Edema, unspecified: Secondary | ICD-10-CM | POA: Diagnosis not present

## 2014-12-06 DIAGNOSIS — F32A Depression, unspecified: Secondary | ICD-10-CM

## 2014-12-06 DIAGNOSIS — N183 Chronic kidney disease, stage 3 unspecified: Secondary | ICD-10-CM

## 2014-12-06 DIAGNOSIS — E039 Hypothyroidism, unspecified: Secondary | ICD-10-CM | POA: Diagnosis not present

## 2014-12-06 DIAGNOSIS — I10 Essential (primary) hypertension: Secondary | ICD-10-CM | POA: Diagnosis not present

## 2014-12-06 DIAGNOSIS — E43 Unspecified severe protein-calorie malnutrition: Secondary | ICD-10-CM

## 2014-12-06 DIAGNOSIS — R42 Dizziness and giddiness: Secondary | ICD-10-CM | POA: Diagnosis not present

## 2014-12-06 DIAGNOSIS — E559 Vitamin D deficiency, unspecified: Secondary | ICD-10-CM

## 2014-12-06 DIAGNOSIS — N189 Chronic kidney disease, unspecified: Secondary | ICD-10-CM | POA: Diagnosis not present

## 2014-12-06 DIAGNOSIS — E1122 Type 2 diabetes mellitus with diabetic chronic kidney disease: Secondary | ICD-10-CM | POA: Diagnosis not present

## 2014-12-06 DIAGNOSIS — F329 Major depressive disorder, single episode, unspecified: Secondary | ICD-10-CM | POA: Diagnosis not present

## 2014-12-07 ENCOUNTER — Encounter: Payer: Self-pay | Admitting: Adult Health

## 2014-12-07 NOTE — Progress Notes (Signed)
Patient ID: Sandra [name], female   DOB: 29-Mar-1930, 81 y.o.   MRN: [id number]   12/06/14  Facility:  Nursing Home Location:  [place] Room Number: 907-P LEVEL OF CARE:  SNF (31)   Chief Complaint  Patient presents with  . Medical Management of Chronic Issues    PVD, chronic kidney disease stage III, diabetes mellitus with renal complications, hypothyroidism, hypertension, depression, vitamin D deficiency, protein calorie malnutrition, constipation and vertigo    HISTORY OF PRESENT ILLNESS:  This is an 81 year old female  who is a long-term care resident of [facility]. She has been admitted to [facility] on 07/14/14 from [facility]. She has PVD with nonhealing wound for which she had left AKA on 07/06/14. She has CKD stage 3 and latest creatinine is 1.38.  She is on Demadex for edema of right AKA stump and abdominal wall. No edema noted @ this time.  PAST MEDICAL HISTORY:  Past Medical History  Diagnosis Date  . Anemia   . CAD (coronary artery disease)   . Peripheral vascular disease   . Hypertension   . Cancer     colon; s/p colectomy 2007  . Anemia, iron deficiency 01/17/2012  . Hemorrhoids   . Nosebleed   . Stroke   . Cellulitis 03/08/12  . Diabetes mellitus     IDDM ; Type 2 for 54 years  . Hypothyroidism     since childhood  . Shortness of breath dyspnea   . Depression   . Wound infection     on right foot   . Diabetic foot ulcers   . RBBB (right bundle branch block with left anterior fascicular block)   . Chronic kidney disease   . Edema of both legs     CURRENT MEDICATIONS: Reviewed per MAR/see medication list    Medication List       This list is accurate as of: 12/06/14 11:59 PM.  Always use your most recent med list.               amLODipine 5 MG tablet  Commonly known as:  NORVASC  Take 5 mg by mouth daily.     aspirin EC 81 MG tablet  Take 81 mg by mouth every evening.     BD PEN NEEDLE NANO U/F 32G X  4 MM Misc  Generic drug:  Insulin Pen Needle     cholecalciferol 1000 UNITS tablet  Commonly known as:  VITAMIN D  Take 5 tablets (5,000 Units total) by mouth daily.     diclofenac sodium 1 % Gel  Commonly known as:  VOLTAREN  Apply 2 g topically 2 (two) times daily. Apply to left shoulder topically BID     FLUoxetine 20 MG tablet  Commonly known as:  PROZAC  Take 20 mg by mouth daily.     Insulin Glargine 100 UNIT/ML Solostar Pen  Commonly known as:  LANTUS SOLOSTAR  Inject 30 Units into the skin daily at 10 pm. 40 units     levothyroxine 125 MCG tablet  Commonly known as:  SYNTHROID, LEVOTHROID  Take 125 mcg by mouth. Synthroid ONlY     meclizine 12.5 MG tablet  Commonly known as:  ANTIVERT  Take 12.5 mg by mouth every 8 (eight) hours as needed for dizziness.     nystatin powder  Commonly known as:  MYCOSTATIN  Apply topically 2 (two) times daily.     oxyCODONE 5 MG  immediate release tablet  Commonly known as:  Oxy IR/ROXICODONE  Take 5 mg by mouth 2 (two) times daily.     polyethylene glycol packet  Commonly known as:  MIRALAX / GLYCOLAX  Take 17 g by mouth 2 (two) times daily.     PROCEL PO  Take 1 scoop by mouth 2 (two) times daily.     PRODIGY NO CODING BLOOD GLUC test strip  Generic drug:  glucose blood     SALINE NASAL SPRAY NA  Place 2 sprays into the nose 2 (two) times daily.     sennosides-docusate sodium 8.6-50 MG tablet  Commonly known as:  SENOKOT-S  Take 1 tablet by mouth 2 (two) times daily.     torsemide 20 MG tablet  Commonly known as:  DEMADEX  Take 20 mg by mouth daily.         Allergies  Allergen Reactions  . Blackberry [Rubus Fruticosus]     Can't take because of Cancer  . Meloxicam Shortness Of Breath  . Procaine Hcl Swelling  . Alendronate Sodium Other (See Comments)    constipation  . Meloxicam      REVIEW OF SYSTEMS:  GENERAL: no change in appetite, no fatigue, no weight changes, no fever, chills or  weakness RESPIRATORY: no cough, SOB, DOE, wheezing, hemoptysis CARDIAC: no chest pain, or palpitations GI: no abdominal pain, diarrhea, heart burn, nausea or vomiting  PHYSICAL EXAMINATION  GENERAL: no acute distress, obese NECK: supple, trachea midline, no neck masses, no thyroid tenderness, no thyromegaly LYMPHATICS: no LAN in the neck, no supraclavicular LAN RESPIRATORY: breathing is even & unlabored, BS CTAB CARDIAC: RRR, no murmur,no extra heart sounds, no edema GI: abdomen soft, normal BS, no masses, no tenderness, no hepatomegaly, no splenomegaly EXTREMITIES:  Able to move all 4 extremities; right AKA  PSYCHIATRIC: the patient is alert & oriented to person, affect & behavior appropriate  LABS/RADIOLOGY: Labs reviewed: 11/04/14  WBC 6.5 hemoglobin 10.3 hematocrit 31.1 MCV 89.9 platelet count 141 09/21/14  WBC 6.2 hemoglobin 10.8 hematocrit 32.5 MCV 87.4 platelet count 150 sodium 139 potassium 4.3 glucose 60 BUN 75 creatinine 1.38 calcium 8.5 hemoglobin A1c 6.8 09/16/14  left elbow x-ray shows minor osteoarthritic changes and no acute fracture or dislocation; x-ray of left shoulder/humerus shows mild osteoarthritic changes/no acute fracture or dislocation 08/17/14  sodium 140 potassium 3.8 glucose 80 BUN 79 creatinine 1.42 calcium 9.4 07/20/14  parathyroid hormone 69 calcium 8.5 07/19/14  WBC 7.6 hemoglobin 9.9 hematocrit 31.1 MCV 88.9 TSH 3.121 platelet 474 Basic Metabolic Panel:  Recent Labs  07/11/14 0450 07/13/14 0518 07/14/14 0353  NA 134* 137 136  K 4.8 4.2 4.0  CL 104 103 100*  CO2 21* 25 25  GLUCOSE 94 108* 116*  BUN 70* 78* 82*  CREATININE 2.32* 1.83* 1.78*  CALCIUM 8.1* 8.5* 8.7*  PHOS  --  4.3 4.2   Liver Function Tests:  Recent Labs  07/08/14 0406 07/09/14 0555 07/10/14 0413 07/13/14 0518 07/14/14 0353  AST 19 16 15   --   --   ALT 12 12 11   --   --   ALKPHOS 89 94 100  --   --   BILITOT 0.5 0.4 0.4  --   --   PROT 5.8* 6.2 6.1  --   --   ALBUMIN 2.2*  2.3* 2.2* 2.2* 2.3*   CBC:  Recent Labs  01/12/14 0625 01/13/14 0645 01/14/14 0728  07/08/14 0406 07/09/14 0555 07/10/14 0413  WBC 6.1 6.7 6.6  < >  8.8 7.2 5.9  NEUTROABS 4.0 4.4 4.4  --   --   --   --   HGB 10.4* 10.1* 10.3*  < > 9.0* 8.9* 9.0*  HCT 32.4* 31.1* 32.0*  < > 29.1* 28.1* 28.5*  MCV 87.6 87.9 88.4  < > 90.9 90.1 89.3  PLT 234 229 213  < > 160 171 173  < > = values in this interval not displayed.  CBG:  Recent Labs  07/13/14 2029 07/14/14 0623 07/14/14 1131  GLUCAP 138* 102* 151*    No results found.  ASSESSMENT/PLAN:  PVD with nonhealing wound of right foot  - S/P AKA ; stable CKD stage 3 - creatinine 1.38;  follow-up with Dr. Florene Glen; nephrology DM with renal complications - [id number] 6.8; continue Lantus 30 units SQ Q HS Hypothyroidism - continue Synthroid 125 mcg daily; check tsh in 1 week Hypertension - well-controlled; continue Norvasc 5 mg PO Q D Depression - mood is stable; continue Prozac 20 mg PO Q D Edema -  decrease Demadex to 20 mg 1 tab PO daily; check BMP in 1 week Vitamin D deficiency - continue Vitamin D 5,000 units PO Q D Protein-calorie malnutrition, severe -  continue supplementation; check albumin in 1 week Constipation -  continue Senna-s 1 tab PO BID and Miralax 17 gm PO BID Vertigo - continue Meclizine 12.5 mg PO Q 8 hours PRN     Goals of care:  Long-term care   [facility], [facility] [phone]

## 2015-01-25 LAB — CBC AND DIFFERENTIAL
HEMATOCRIT: 33 % — AB (ref 36–46)
Hemoglobin: 10.5 g/dL — AB (ref 12.0–16.0)
PLATELETS: 137 10*3/uL — AB (ref 150–399)
WBC: 6.3 10^3/mL

## 2015-01-25 LAB — BASIC METABOLIC PANEL
BUN: 70 mg/dL — AB (ref 4–21)
Creatinine: 1.7 mg/dL — AB (ref 0.5–1.1)
Glucose: 60 mg/dL
Potassium: 4.3 mmol/L (ref 3.4–5.3)
Sodium: 139 mmol/L (ref 137–147)

## 2015-01-25 LAB — HEPATIC FUNCTION PANEL
ALK PHOS: 113 U/L (ref 25–125)
ALT: 9 U/L (ref 7–35)
AST: 15 U/L (ref 13–35)
BILIRUBIN, TOTAL: 0.6 mg/dL

## 2015-02-07 ENCOUNTER — Encounter: Payer: Self-pay | Admitting: Family

## 2015-02-08 ENCOUNTER — Other Ambulatory Visit: Payer: Self-pay | Admitting: *Deleted

## 2015-02-08 DIAGNOSIS — I739 Peripheral vascular disease, unspecified: Secondary | ICD-10-CM

## 2015-02-09 ENCOUNTER — Inpatient Hospital Stay (HOSPITAL_COMMUNITY): Admission: RE | Admit: 2015-02-09 | Payer: Medicare Other | Source: Ambulatory Visit

## 2015-02-09 ENCOUNTER — Ambulatory Visit: Payer: Medicare Other | Admitting: Family

## 2015-02-18 ENCOUNTER — Non-Acute Institutional Stay (SKILLED_NURSING_FACILITY): Payer: Medicare Other | Admitting: Adult Health

## 2015-02-18 ENCOUNTER — Encounter: Payer: Self-pay | Admitting: Adult Health

## 2015-02-18 DIAGNOSIS — K5900 Constipation, unspecified: Secondary | ICD-10-CM | POA: Diagnosis not present

## 2015-02-18 DIAGNOSIS — R42 Dizziness and giddiness: Secondary | ICD-10-CM

## 2015-02-18 DIAGNOSIS — N183 Chronic kidney disease, stage 3 unspecified: Secondary | ICD-10-CM

## 2015-02-18 DIAGNOSIS — Z794 Long term (current) use of insulin: Secondary | ICD-10-CM

## 2015-02-18 DIAGNOSIS — F329 Major depressive disorder, single episode, unspecified: Secondary | ICD-10-CM

## 2015-02-18 DIAGNOSIS — E43 Unspecified severe protein-calorie malnutrition: Secondary | ICD-10-CM | POA: Diagnosis not present

## 2015-02-18 DIAGNOSIS — R609 Edema, unspecified: Secondary | ICD-10-CM | POA: Diagnosis not present

## 2015-02-18 DIAGNOSIS — M25511 Pain in right shoulder: Secondary | ICD-10-CM

## 2015-02-18 DIAGNOSIS — L03114 Cellulitis of left upper limb: Secondary | ICD-10-CM | POA: Diagnosis not present

## 2015-02-18 DIAGNOSIS — E1122 Type 2 diabetes mellitus with diabetic chronic kidney disease: Secondary | ICD-10-CM

## 2015-02-18 DIAGNOSIS — E559 Vitamin D deficiency, unspecified: Secondary | ICD-10-CM | POA: Diagnosis not present

## 2015-02-18 DIAGNOSIS — I10 Essential (primary) hypertension: Secondary | ICD-10-CM | POA: Diagnosis not present

## 2015-02-18 DIAGNOSIS — I739 Peripheral vascular disease, unspecified: Secondary | ICD-10-CM

## 2015-02-18 DIAGNOSIS — E039 Hypothyroidism, unspecified: Secondary | ICD-10-CM | POA: Diagnosis not present

## 2015-02-18 DIAGNOSIS — F32A Depression, unspecified: Secondary | ICD-10-CM

## 2015-03-25 ENCOUNTER — Non-Acute Institutional Stay (SKILLED_NURSING_FACILITY): Payer: Medicare Other | Admitting: Adult Health

## 2015-03-25 DIAGNOSIS — E039 Hypothyroidism, unspecified: Secondary | ICD-10-CM | POA: Diagnosis not present

## 2015-03-25 DIAGNOSIS — E1122 Type 2 diabetes mellitus with diabetic chronic kidney disease: Secondary | ICD-10-CM

## 2015-03-25 DIAGNOSIS — F329 Major depressive disorder, single episode, unspecified: Secondary | ICD-10-CM | POA: Diagnosis not present

## 2015-03-25 DIAGNOSIS — I10 Essential (primary) hypertension: Secondary | ICD-10-CM | POA: Diagnosis not present

## 2015-03-25 DIAGNOSIS — M25511 Pain in right shoulder: Secondary | ICD-10-CM

## 2015-03-25 DIAGNOSIS — E559 Vitamin D deficiency, unspecified: Secondary | ICD-10-CM | POA: Diagnosis not present

## 2015-03-25 DIAGNOSIS — I739 Peripheral vascular disease, unspecified: Secondary | ICD-10-CM

## 2015-03-25 DIAGNOSIS — N183 Chronic kidney disease, stage 3 unspecified: Secondary | ICD-10-CM

## 2015-03-25 DIAGNOSIS — R42 Dizziness and giddiness: Secondary | ICD-10-CM | POA: Diagnosis not present

## 2015-03-25 DIAGNOSIS — L03116 Cellulitis of left lower limb: Secondary | ICD-10-CM | POA: Diagnosis not present

## 2015-03-25 DIAGNOSIS — R609 Edema, unspecified: Secondary | ICD-10-CM

## 2015-03-25 DIAGNOSIS — F32A Depression, unspecified: Secondary | ICD-10-CM

## 2015-03-25 DIAGNOSIS — E43 Unspecified severe protein-calorie malnutrition: Secondary | ICD-10-CM

## 2015-03-25 DIAGNOSIS — K5900 Constipation, unspecified: Secondary | ICD-10-CM

## 2015-03-25 DIAGNOSIS — Z794 Long term (current) use of insulin: Secondary | ICD-10-CM

## 2015-03-27 NOTE — Progress Notes (Signed)
Patient ID: Sandra [name], female   DOB: Jun 07, 1930, 82 y.o.   MRN: [id number]   DATE:     02/18/15  Facility:  Nursing Home Location:  [place] Room Number: [id number] LEVEL OF CARE:  SNF (31)   Chief Complaint  Patient presents with  . Medical Management of Chronic Issues    Right shoulder pain, chronic kidney disease stage III, diabetes mellitus with renal complications, hypothyroidism, hypertension, depression, vitamin D deficiency, protein calorie malnutrition, constipation, left arm cellulitis and vertigo    HISTORY OF PRESENT ILLNESS:  This is an 82 year old female  who is a long-term care resident of [facility]. She has been [facility]14 from [facility]. She has PVD with nonhealing wound for which she had left AKA on 07/06/14. She has CKD stage 3 and latest creatinine is 1.72.  It was noted that her left arm is erythematous and has clear drainage.  PAST MEDICAL HISTORY:  Past Medical History  Diagnosis Date  . Anemia   . CAD (coronary artery disease)   . Peripheral vascular disease ([place])   . Hypertension   . Cancer [facility])     colon; s/p colectomy 2007  . Anemia, iron deficiency 01/17/2012  . Hemorrhoids   . Nosebleed   . Stroke ([place])   . Cellulitis 03/08/12  . Diabetes mellitus     IDDM ; Type 2 for 54 years  . Hypothyroidism     since childhood  . Shortness of breath dyspnea   . Depression   . Wound infection ([place])     on right foot   . Diabetic foot ulcers ([place])   . RBBB (right bundle branch block with left anterior fascicular block)   . Chronic kidney disease   . Edema of both legs     CURRENT MEDICATIONS: Reviewed per MAR/see medication list    Medication List       This list is accurate as of: 02/18/15 11:59 PM.  Always use your most recent med list.               amLODipine 5 MG tablet  Commonly known as:  NORVASC  Take 5 mg by mouth daily.     aspirin EC 81 MG tablet  Take 81 mg by  mouth every evening.     BD PEN NEEDLE NANO U/F 32G X 4 MM Misc  Generic drug:  Insulin Pen Needle     cholecalciferol 1000 units tablet  Commonly known as:  VITAMIN D  Take 5 tablets (5,000 Units total) by mouth daily.     diclofenac sodium 1 % Gel  Commonly known as:  VOLTAREN  Apply 2 g topically 2 (two) times daily. Apply to left shoulder topically BID     FLUoxetine 20 MG tablet  Commonly known as:  PROZAC  Take 20 mg by mouth daily.     Insulin Glargine 100 UNIT/ML Solostar Pen  Commonly known as:  LANTUS SOLOSTAR  Inject 30 Units into the skin daily at 10 pm. 40 units     levothyroxine 125 MCG tablet  Commonly known as:  SYNTHROID, LEVOTHROID  Take 125 mcg by mouth. Synthroid ONlY     meclizine 12.5 MG tablet  Commonly known as:  ANTIVERT  Take 12.5 mg by mouth every 8 (eight) hours as needed for dizziness.     nystatin powder  Commonly known as:  MYCOSTATIN  Apply topically 2 (two)  times daily.     oxyCODONE 5 MG immediate release tablet  Commonly known as:  Oxy IR/ROXICODONE  Take 5 mg by mouth 2 (two) times daily.     polyethylene glycol packet  Commonly known as:  MIRALAX / GLYCOLAX  Take 17 g by mouth 2 (two) times daily.     PROCEL PO  Take 1 scoop by mouth 2 (two) times daily.     PRODIGY NO CODING BLOOD GLUC test strip  Generic drug:  glucose blood     SALINE NASAL SPRAY NA  Place 2 sprays into the nose 2 (two) times daily.     sennosides-docusate sodium 8.6-50 MG tablet  Commonly known as:  SENOKOT-S  Take 1 tablet by mouth 2 (two) times daily.     torsemide 20 MG tablet  Commonly known as:  DEMADEX  Take 20 mg by mouth daily.         Allergies  Allergen Reactions  . Blackberry [Rubus Fruticosus]     Can't take because of Cancer  . Meloxicam Shortness Of Breath  . Procaine Hcl Swelling  . Alendronate Sodium Other (See Comments)    constipation  . Meloxicam      REVIEW OF SYSTEMS:  GENERAL: no change in appetite, no fatigue,  no weight changes, no fever, chills or weakness RESPIRATORY: no cough, SOB, DOE, wheezing, hemoptysis CARDIAC: no chest pain, or palpitations GI: no abdominal pain, diarrhea, heart burn, nausea or vomiting  PHYSICAL EXAMINATION  GENERAL: no acute distress, obese NECK: supple, trachea midline, no neck masses, no thyroid tenderness, no thyromegaly LYMPHATICS: no LAN in the neck, no supraclavicular LAN RESPIRATORY: breathing is even & unlabored, BS CTAB CARDIAC: RRR, no murmur,no extra heart sounds, LUE edema 2+ GI: abdomen soft, normal BS, no masses, no tenderness, no hepatomegaly, no splenomegaly EXTREMITIES:  Able to move all 4 extremities; right AKA  PSYCHIATRIC: the patient is alert & oriented to person, affect & behavior appropriate  LABS/RADIOLOGY: Labs reviewed: 01/25/15  sodium 139 potassium 4.3 glucose 60 BUN 17 creatinine 1.72 calcium 9.3 total protein 6.1 albumin 3.31 total bilirubin 0.64 alkaline phosphatase 113 SGOT 15 SGPT 9 WBC 6.3 hemoglobin 10.5 hematocrit 33.4 MCV 92.8 platelet 137 12/14/14  sodium 138 potassium 4.5 glucose 101 BUN 78 creatinine 1.69 calcium 9.2 albumin 3.49 11/04/14  WBC 6.5 hemoglobin 10.3 hematocrit 31.1 MCV 89.9 platelet count 141 09/21/14  WBC 6.2 hemoglobin 10.8 hematocrit 32.5 MCV 87.4 platelet count 150 sodium 139 potassium 4.3 glucose 60 BUN 75 creatinine 1.38 calcium 8.5 hemoglobin A1c 6.8 09/16/14  left elbow x-ray shows minor osteoarthritic changes and no acute fracture or dislocation; x-ray of left shoulder/humerus shows mild osteoarthritic changes/no acute fracture or dislocation 08/17/14  sodium 140 potassium 3.8 glucose 80 BUN 79 creatinine 1.42 calcium 9.4 07/20/14  parathyroid hormone 69 calcium 8.5 07/19/14  WBC 7.6 hemoglobin 9.9 hematocrit 31.1 MCV 88.9 TSH 3.121 platelet [id number] Basic Metabolic Panel:  Recent Labs  07/11/14 0450 07/13/14 0518 07/14/14 0353  NA 134* 137 136  K 4.8 4.2 4.0  CL 104 103 100*  CO2 21* 25 25  GLUCOSE 94 108*  116*  BUN 70* 78* 82*  CREATININE 2.32* 1.83* 1.78*  CALCIUM 8.1* 8.5* 8.7*  PHOS  --  4.3 4.2   Liver Function Tests:  Recent Labs  07/08/14 0406 07/09/14 0555 07/10/14 0413 07/13/14 0518 07/14/14 0353  AST 19 16 15   --   --   ALT 12 12 11   --   --  ALKPHOS 89 94 100  --   --   BILITOT 0.5 0.4 0.4  --   --   PROT 5.8* 6.2 6.1  --   --   ALBUMIN 2.2* 2.3* 2.2* 2.2* 2.3*   CBC:  Recent Labs  07/08/14 0406 07/09/14 0555 07/10/14 0413  WBC 8.8 7.2 5.9  HGB 9.0* 8.9* 9.0*  HCT 29.1* 28.1* 28.5*  MCV 90.9 90.1 89.3  PLT 160 171 173    CBG:  Recent Labs  07/13/14 2029 07/14/14 0623 07/14/14 1131  GLUCAP 138* 102* 151*     ASSESSMENT/PLAN:  PVD with nonhealing wound of right foot  - S/P AKA ; stable  CKD stage 3 - creatinine 1.72;  will monitor  DM with renal complications - [id number] 6.8; continue Lantus 30 units SQ Q HS  Hypothyroidism - continue Synthroid 125 mcg daily  Hypertension - well-controlled; continue Norvasc 5 mg PO Q D  Depression - mood is stable; continue Prozac 20 mg 1 capsule PO Q D  Edema -  increase Demadex to 20 mg 1 tab PO twice a day   Vitamin D deficiency - continue Vitamin D 5,000 units PO Q D  Protein-calorie malnutrition -   Albumin 3.31; continue Procel 1 scoop by mouth twice a day  Constipation -  continue Senna-s 1 tab PO BID and Miralax 17 gm PO BID  Vertigo - continue Meclizine 12.5 mg PO Q 8 hours PRN  Right shoulder pain  - apply Voltaren gel 1% 2 g to left shoulder twice a day and OxyIR 5 mg 1 tab by mouth every 12 hours   Left arm cellulitis - start doxycycline 100 mg 1 by mouth twice a day 10 days and Florastor 250 mg 1 capsule by mouth twice a day 13 days    Goals of care:  Long-term care   [facility], [facility] [phone]

## 2015-03-28 ENCOUNTER — Encounter: Payer: Self-pay | Admitting: Adult Health

## 2015-03-28 LAB — BASIC METABOLIC PANEL
BUN: 78 mg/dL — AB (ref 4–21)
CREATININE: 2.1 mg/dL — AB (ref 0.5–1.1)
Glucose: 52 mg/dL
Potassium: 4.1 mmol/L (ref 3.4–5.3)
Sodium: 138 mmol/L (ref 137–147)

## 2015-03-28 NOTE — Progress Notes (Signed)
Patient ID: Sandra [name], female   DOB: 29-Jul-1930, 82 y.o.   MRN: [id number]   DATE:     03/25/15  Facility:  Nursing Home Location:  [place] Room Number: [id number] LEVEL OF CARE:  SNF (31)   Chief Complaint  Patient presents with  . Medical Management of Chronic Issues    Right shoulder pain, PVD, chronic kidney disease stage III, diabetes mellitus with renal complications, hypothyroidism, hypertension, depression, vitamin D deficiency, protein calorie malnutrition, constipation, LLE cellulitis and vertigo    HISTORY OF PRESENT ILLNESS:  This is an 82 year old female  who is a long-term care resident of [facility]. She was noted to have erythematous LLE (shin). Her LUE is edematous, 3+ and is draining clear drainage. Son is @ bedside. Patient complained of right shoulder pain when being repositioned. Latest weight is 228.2 lbs - she had significant weight gain.  PAST MEDICAL HISTORY:  Past Medical History  Diagnosis Date  . Anemia   . CAD (coronary artery disease)   . Peripheral vascular disease ([place])   . Hypertension   . Cancer [facility])     colon; s/p colectomy 2007  . Anemia, iron deficiency 01/17/2012  . Hemorrhoids   . Nosebleed   . Stroke ([place])   . Cellulitis 03/08/12  . Diabetes mellitus     IDDM ; Type 2 for 54 years  . Hypothyroidism     since childhood  . Shortness of breath dyspnea   . Depression   . Wound infection ([place])     on right foot   . Diabetic foot ulcers ([place])   . RBBB (right bundle branch block with left anterior fascicular block)   . Chronic kidney disease   . Edema of both legs     CURRENT MEDICATIONS: Reviewed per MAR/see medication list    Medication List       This list is accurate as of: 03/25/15 11:59 PM.  Always use your most recent med list.               amLODipine 5 MG tablet  Commonly known as:  NORVASC  Take 5 mg by mouth daily.     aspirin EC 81 MG tablet  Take 81 mg by mouth every evening.     BD PEN NEEDLE NANO U/F 32G X 4 MM Misc  Generic drug:  Insulin Pen Needle     cholecalciferol 1000 units tablet  Commonly known as:  VITAMIN D  Take 5 tablets (5,000 Units total) by mouth daily.     diclofenac sodium 1 % Gel  Commonly known as:  VOLTAREN  Apply 2 g topically 2 (two) times daily. Apply to left shoulder topically BID     FLUoxetine 20 MG tablet  Commonly known as:  PROZAC  Take 20 mg by mouth daily.     Insulin Glargine 100 UNIT/ML Solostar Pen  Commonly known as:  LANTUS SOLOSTAR  Inject 30 Units into the skin daily at 10 pm. 40 units     levothyroxine 125 MCG tablet  Commonly known as:  SYNTHROID, LEVOTHROID  Take 125 mcg by mouth. Synthroid ONlY     meclizine 12.5 MG tablet  Commonly known as:  ANTIVERT  Take 12.5 mg by mouth every 8 (eight) hours as needed for dizziness.     nystatin powder  Commonly known as:  MYCOSTATIN  Apply topically 2 (two) times daily.     oxyCODONE 5 MG immediate  release tablet  Commonly known as:  Oxy IR/ROXICODONE  Take 5 mg by mouth 2 (two) times daily.     polyethylene glycol packet  Commonly known as:  MIRALAX / GLYCOLAX  Take 17 g by mouth 2 (two) times daily.     PROCEL PO  Take 1 scoop by mouth 2 (two) times daily.     PRODIGY NO CODING BLOOD GLUC test strip  Generic drug:  glucose blood     SALINE NASAL SPRAY NA  Place 2 sprays into the nose 2 (two) times daily.     sennosides-docusate sodium 8.6-50 MG tablet  Commonly known as:  SENOKOT-S  Take 1 tablet by mouth 2 (two) times daily.     torsemide 20 MG tablet  Commonly known as:  DEMADEX  Take 20 mg by mouth 2 (two) times daily. Take 2 tabs = 40 mg PO BID X 1 week then Demadex 20 mg BID         Allergies  Allergen Reactions  . Blackberry [Rubus Fruticosus]     Can't take because of Cancer  . Meloxicam Shortness Of Breath  . Procaine Hcl Swelling  . Alendronate Sodium Other (See Comments)    constipation  . Meloxicam      REVIEW OF  SYSTEMS:  GENERAL: no change in appetite, no fatigue, no weight changes, no fever, chills or weakness RESPIRATORY: no cough, SOB, DOE, wheezing, hemoptysis CARDIAC: no chest pain, or palpitations GI: no abdominal pain, diarrhea, heart burn, nausea or vomiting  PHYSICAL EXAMINATION  GENERAL: no acute distress, obese SKIN:  Left shin is erythematous, no drainage NECK: supple, trachea midline, no neck masses, no thyroid tenderness, no thyromegaly LYMPHATICS: no LAN in the neck, no supraclavicular LAN RESPIRATORY: breathing is even & unlabored, BS CTAB CARDIAC: RRR, no murmur,no extra heart sounds, LUE edema 3+ GI: abdomen soft, normal BS, no masses, no tenderness, no hepatomegaly, no splenomegaly EXTREMITIES:  Able to move all 4 extremities; right AKA  PSYCHIATRIC: the patient is alert & oriented to person, affect & behavior appropriate  LABS/RADIOLOGY: Labs reviewed: 01/25/15  sodium 139 potassium 4.3 glucose 60 BUN 17 creatinine 1.72 calcium 9.3 total protein 6.1 albumin 3.31 total bilirubin 0.64 alkaline phosphatase 113 SGOT 15 SGPT 9 WBC 6.3 hemoglobin 10.5 hematocrit 33.4 MCV 92.8 platelet 137 12/14/14  sodium 138 potassium 4.5 glucose 101 BUN 78 creatinine 1.69 calcium 9.2 albumin 3.49 11/04/14  WBC 6.5 hemoglobin 10.3 hematocrit 31.1 MCV 89.9 platelet count 141 09/21/14  WBC 6.2 hemoglobin 10.8 hematocrit 32.5 MCV 87.4 platelet count 150 sodium 139 potassium 4.3 glucose 60 BUN 75 creatinine 1.38 calcium 8.5 hemoglobin A1c 6.8 09/16/14  left elbow x-ray shows minor osteoarthritic changes and no acute fracture or dislocation; x-ray of left shoulder/humerus shows mild osteoarthritic changes/no acute fracture or dislocation 08/17/14  sodium 140 potassium 3.8 glucose 80 BUN 79 creatinine 1.42 calcium 9.4 07/20/14  parathyroid hormone 69 calcium 8.5 07/19/14  WBC 7.6 hemoglobin 9.9 hematocrit 31.1 MCV 88.9 TSH 3.121 platelet [id number] Basic Metabolic Panel:  Recent Labs  07/11/14 0450  07/13/14 0518 07/14/14 0353  NA 134* 137 136  K 4.8 4.2 4.0  CL 104 103 100*  CO2 21* 25 25  GLUCOSE 94 108* 116*  BUN 70* 78* 82*  CREATININE 2.32* 1.83* 1.78*  CALCIUM 8.1* 8.5* 8.7*  PHOS  --  4.3 4.2   Liver Function Tests:  Recent Labs  07/08/14 0406 07/09/14 0555 07/10/14 0413 07/13/14 0518 07/14/14 0353  AST 19 16 15   --   --  ALT 12 12 11   --   --   ALKPHOS 89 94 100  --   --   BILITOT 0.5 0.4 0.4  --   --   PROT 5.8* 6.2 6.1  --   --   ALBUMIN 2.2* 2.3* 2.2* 2.2* 2.3*   CBC:  Recent Labs  07/08/14 0406 07/09/14 0555 07/10/14 0413  WBC 8.8 7.2 5.9  HGB 9.0* 8.9* 9.0*  HCT 29.1* 28.1* 28.5*  MCV 90.9 90.1 89.3  PLT 160 171 173    CBG:  Recent Labs  07/13/14 2029 07/14/14 0623 07/14/14 1131  GLUCAP 138* 102* 151*     ASSESSMENT/PLAN:  PVD with nonhealing wound of right foot  - S/P AKA ; stable  CKD stage 3 - creatinine 1.72;  will monitor  DM with renal complications - [id number] 6.8; continue Lantus 30 units SQ Q HS  Hypothyroidism - continue Synthroid 125 mcg daily  Hypertension - well-controlled; continue Norvasc 5 mg PO Q D  Depression - mood is stable; continue Prozac 20 mg 1 capsule PO Q D  Edema -  increase Demadex to 20 mg 1 tab PO twice a day   Vitamin D deficiency - continue Vitamin D 5,000 units PO Q D  Protein-calorie malnutrition -   Albumin 3.31; continue Procel 1 scoop by mouth twice a day  Constipation -  continue Senna-s 1 tab PO BID and Miralax 17 gm PO BID  Vertigo - continue Meclizine 12.5 mg PO Q 8 hours PRN  Right shoulder pain  - apply Voltaren gel 1% 2 g to left shoulder twice a day and OxyIR 5 mg 1 tab by mouth every 12 hours; refer to orthopedics  LLE cellulitis - start doxycycline 100 mg 1 by mouth twice a day 14 days and Florastor 250 mg 1 capsule by mouth twice a day 17 days    Goals of care:  Long-term care   [facility], NP [facility] [phone]

## 2015-04-06 ENCOUNTER — Other Ambulatory Visit: Payer: Self-pay

## 2015-04-06 MED ORDER — OXYCODONE HCL 5 MG PO TABS: 5.0000 mg | ORAL_TABLET | Freq: Two times a day (BID) | ORAL | Status: DC

## 2015-04-07 ENCOUNTER — Other Ambulatory Visit: Payer: Self-pay | Admitting: *Deleted

## 2015-04-07 MED ORDER — OXYCODONE HCL 5 MG PO TABS: ORAL_TABLET | ORAL | Status: DC

## 2015-04-07 NOTE — Telephone Encounter (Signed)
[facility] 

## 2015-04-15 ENCOUNTER — Non-Acute Institutional Stay (SKILLED_NURSING_FACILITY): Payer: Medicare Other | Admitting: Internal Medicine

## 2015-04-15 ENCOUNTER — Encounter: Payer: Self-pay | Admitting: Internal Medicine

## 2015-04-15 DIAGNOSIS — I10 Essential (primary) hypertension: Secondary | ICD-10-CM | POA: Diagnosis not present

## 2015-04-15 DIAGNOSIS — I739 Peripheral vascular disease, unspecified: Secondary | ICD-10-CM

## 2015-04-15 DIAGNOSIS — E1122 Type 2 diabetes mellitus with diabetic chronic kidney disease: Secondary | ICD-10-CM | POA: Diagnosis not present

## 2015-04-15 DIAGNOSIS — M19012 Primary osteoarthritis, left shoulder: Secondary | ICD-10-CM

## 2015-04-15 DIAGNOSIS — R601 Generalized edema: Secondary | ICD-10-CM

## 2015-04-15 DIAGNOSIS — E038 Other specified hypothyroidism: Secondary | ICD-10-CM

## 2015-04-15 NOTE — Progress Notes (Signed)
Patient ID: Sandra [name], female   DOB: 1930-12-17, 82 y.o.   MRN: [id number]     [facility]   PCP:  Melinda Crutch, MD  Code Status: Full Code  Allergies  Allergen Reactions  . Blackberry [Rubus Fruticosus]     Can't take because of Cancer  . Meloxicam Shortness Of Breath  . Procaine Hcl Swelling  . Alendronate Sodium Other (See Comments)    constipation  . Meloxicam     Chief Complaint  Patient presents with  . Medical Management of Chronic Issues    Routine Visit    Extended Emergency Contact Information Primary Emergency Contact: Carolyn Stare [place] of [place] Phone: [phone] Mobile Phone: [phone] Relation: Son Secondary Emergency Contact: Cindie Crumbly, [place] Mobile Phone: [phone] Relation: Son    HPI:  82 year old patient is seen for routine visit. She has generalized edema more to lower extremities with her PVD. She has ulceration and blistering to LLE and has severe OA to left shoulder and is being followed by orthopedics. Currently on demadex and tolerating it well. She recently completed antibiotic for pneumonia. Her pain medication was further adjusted recently. She also completed antibiotic for presumed cellulitis to her LLE. She has history of PVD, CKD stage 3, HTN, DM type 2, anemia, right AKA.   Review of Systems:  Constitutional: Negative for fever HENT: Negative for headache, congestion, nasal discharge Eyes: Negative for double vision and discharge.  Respiratory: Negative for cough, shortness of breath and wheezing. On o2   Cardiovascular: Negative for chest pain, palpitations, leg swelling.  Gastrointestinal: Negative for heartburn, nausea, vomiting, abdominal pain.  Genitourinary: Negative for dysuria Musculoskeletal: Negative for back pain Skin: Negative for itching, rash.  Neurological: Negative for dizziness   Past Medical History  Diagnosis  Date  . Anemia   . CAD (coronary artery disease)   . Peripheral vascular disease ([place])   . Hypertension   . Cancer [facility])     colon; s/p colectomy 2007  . Anemia, iron deficiency 01/17/2012  . Hemorrhoids   . Nosebleed   . Stroke ([place])   . Cellulitis 03/08/12  . Diabetes mellitus     IDDM ; Type 2 for 54 years  . Hypothyroidism     since childhood  . Shortness of breath dyspnea   . Depression   . Wound infection ([place])     on right foot   . Diabetic foot ulcers ([place])   . RBBB (right bundle branch block with left anterior fascicular block)   . Chronic kidney disease   . Edema of both legs     Medications: Patient's Medications  New Prescriptions   No medications on file  Previous Medications   ACETAMINOPHEN (TYLENOL) 325 MG TABLET    Take 650 mg by mouth every 4 (four) hours as needed for mild pain.   AMLODIPINE (NORVASC) 5 MG TABLET    Take 5 mg by mouth daily.   ASPIRIN EC 81 MG TABLET    Take 81 mg by mouth every evening.   BD PEN NEEDLE NANO U/F 32G X 4 MM MISC    Reported on 04/15/2015   CHOLECALCIFEROL (VITAMIN D) 1000 UNITS TABLET    Take 5 tablets (5,000 Units total) by mouth daily.   DICLOFENAC SODIUM (VOLTAREN) 1 % GEL    Apply 2 g topically 2 (two) times daily. Apply to left shoulder  topically BID   FLUOXETINE (PROZAC) 20 MG TABLET    Take 20 mg by mouth daily.   INSULIN GLARGINE (LANTUS SOLOSTAR) 100 UNIT/ML SOLOSTAR PEN    Inject 30 Units into the skin daily at 10 pm. 40 units   LEVOTHYROXINE (SYNTHROID, LEVOTHROID) 125 MCG TABLET    Take 125 mcg by mouth. Synthroid ONlY   MECLIZINE (ANTIVERT) 12.5 MG TABLET    Take 12.5 mg by mouth every 8 (eight) hours as needed for dizziness.   NYSTATIN (MYCOSTATIN) POWDER    Apply topically 2 (two) times daily.    OXYCODONE (OXY IR/ROXICODONE) 5 MG IMMEDIATE RELEASE TABLET    Take one tablet by mouth three times daily for pain   POLYETHYLENE GLYCOL (MIRALAX / GLYCOLAX) PACKET    Take 17 g by mouth 2 (two) times daily.   PRODIGY  NO CODING BLOOD GLUC TEST STRIP    Reported on 04/15/2015   PROTEIN (PROCEL PO)    Take 1 scoop by mouth 2 (two) times daily.   SALINE NASAL SPRAY NA    Place 2 sprays into the nose 2 (two) times daily.   SENNOSIDES-DOCUSATE SODIUM (SENOKOT-S) 8.6-50 MG TABLET    Take 1 tablet by mouth 2 (two) times daily.   TORSEMIDE (DEMADEX) 20 MG TABLET    Take 20 mg by mouth 2 (two) times daily. Take 2 tabs = 40 mg PO BID X 1 week then Demadex 20 mg BID  Modified Medications   No medications on file  Discontinued Medications   No medications on file     Physical Exam: Filed Vitals:   04/15/15 1148  BP: 148/70  Pulse: 58  Temp: 97.8 F (36.6 C)  TempSrc: Oral  Resp: 18  Height: 5\' 1"  (1.549 m)  Weight: 226 lb (102.513 kg)  SpO2: 94%    General- elderly female, frail, chronically ill appearing, obese, in no acute distress Head- normocephalic, atraumatic Throat- moist mucus membrane Eyes- PERRLA, EOMI, no pallor, no icterus, no discharge, normal conjunctiva, normal sclera Neck- no cervical lymphadenopathy Cardiovascular- normal s1,s2, no murmurs, dorsalis pedis not palpable, left leg 3+ pitting edema upto her thighs Respiratory- bilateral poor air entry, no wheeze, no rhonchi, no crackles, no use of accessory muscles, on o2 Abdomen- bowel sounds present, soft, non tender Musculoskeletal- right AKA, LLE in unna boot, minimal ROM to Left shoulder with crepitation +, swelling to left arm +,  Neurological- no focal deficit Skin- warm and dry Psychiatry- alert and oriented    Labs reviewed: Basic Metabolic Panel:  Recent Labs  07/11/14 0450 07/13/14 0518 07/14/14 0353 01/25/15  NA 134* 137 136 139  K 4.8 4.2 4.0 4.3  CL 104 103 100*  --   CO2 21* 25 25  --   GLUCOSE 94 108* 116*  --   BUN 70* 78* 82* 70*  CREATININE 2.32* 1.83* 1.78* 1.7*  CALCIUM 8.1* 8.5* 8.7*  --   PHOS  --  4.3 4.2  --    Liver Function Tests:  Recent Labs  07/08/14 0406 07/09/14 0555 07/10/14 0413  07/13/14 0518 07/14/14 0353 01/25/15  AST 19 16 15   --   --  15  ALT 12 12 11   --   --  9  ALKPHOS 89 94 100  --   --  113  BILITOT 0.5 0.4 0.4  --   --   --   PROT 5.8* 6.2 6.1  --   --   --   ALBUMIN 2.2* 2.3*  2.2* 2.2* 2.3*  --    No results for input(s): LIPASE, AMYLASE in the last 8760 hours. No results for input(s): AMMONIA in the last 8760 hours. CBC:  Recent Labs  07/08/14 0406 07/09/14 0555 07/10/14 0413 01/25/15  WBC 8.8 7.2 5.9 6.3  HGB 9.0* 8.9* 9.0* 10.5*  HCT 29.1* 28.1* 28.5* 33*  MCV 90.9 90.1 89.3  --   PLT 160 171 173 137*    Lab Results  Component Value Date   HGBA1C 6.8 09/21/2014     Assessment/Plan  Uncontrolled HTN Has elevated BP. Discontinue amlodipine. Start hydralazine 25 mg bid. Cannot start hctz or ACEI/ARB with impaired renal function. Check bp bid for now  hypothyroidism Continue synthroid 125 mcg daily and check tsh  Generalized anasarca Alb 3.31. Check prealbumin level. Discontinue amlodipine which can contribute to swelling. Poor circulation to both arms and legs. Continue demadex 20 mg bid for now. Check bmp  Left shoulder OA Followed by orthopedics. Her oxycodone was increased to q6h prn and she is tolerating it well at present  Severe PVD S/p right AKA. Has Compression wrap to her LLE. Continue f/u with orthopedics. Keep legs elevated.   DM with renal disease Check a1c. Continue lantus 30 u daily and monitor cbg    Blanchie Serve, MD  [facility] [phone] (Monday-Friday 8 am - 5 pm) [phone] (afterhours)

## 2015-04-18 LAB — BASIC METABOLIC PANEL
BUN: 81 mg/dL — AB (ref 4–21)
Creatinine: 2.3 mg/dL — AB (ref 0.5–1.1)
GLUCOSE: 51 mg/dL
Potassium: 5.6 mmol/L — AB (ref 3.4–5.3)
Sodium: 132 mmol/L — AB (ref 137–147)

## 2015-04-18 LAB — CBC AND DIFFERENTIAL
HEMATOCRIT: 29 % — AB (ref 36–46)
Hemoglobin: 9 g/dL — AB (ref 12.0–16.0)
NEUTROS ABS: 5 /uL
Platelets: 139 10*3/uL — AB (ref 150–399)
WBC: 6.6 10*3/mL

## 2015-04-18 LAB — HEPATIC FUNCTION PANEL
ALT: 5 U/L — AB (ref 7–35)
AST: 15 U/L (ref 13–35)
Alkaline Phosphatase: 90 U/L (ref 25–125)
BILIRUBIN, TOTAL: 0.8 mg/dL

## 2015-04-19 ENCOUNTER — Encounter: Payer: Self-pay | Admitting: Adult Health

## 2015-04-19 ENCOUNTER — Non-Acute Institutional Stay (SKILLED_NURSING_FACILITY): Payer: Medicare Other | Admitting: Adult Health

## 2015-04-19 DIAGNOSIS — E871 Hypo-osmolality and hyponatremia: Secondary | ICD-10-CM | POA: Diagnosis not present

## 2015-04-19 DIAGNOSIS — E1122 Type 2 diabetes mellitus with diabetic chronic kidney disease: Secondary | ICD-10-CM | POA: Diagnosis not present

## 2015-04-19 DIAGNOSIS — E875 Hyperkalemia: Secondary | ICD-10-CM | POA: Diagnosis not present

## 2015-04-19 DIAGNOSIS — R601 Generalized edema: Secondary | ICD-10-CM

## 2015-04-19 DIAGNOSIS — N183 Chronic kidney disease, stage 3 unspecified: Secondary | ICD-10-CM

## 2015-04-19 NOTE — Progress Notes (Signed)
Patient ID: Sandra [name], female   DOB: 02/19/1931, 82 y.o.   MRN: [id number]   DATE:     04/19/15  Facility:  Nursing Home Location:  [place] Room Number: 1001-1 LEVEL OF CARE:  SNF (31)   Chief Complaint  Patient presents with  . Acute Visit    Diabetes mellitus, type 2 and hyperkalemia    HISTORY OF PRESENT ILLNESS:  This is an 82 year old female  who has K 5.6. She is currently taking Torsemide 20 mg PO BID for Anasarca. Swelling on LUE is 2+ and LLE is 2+, as well. Her LLE is covered with unna boot and swelling has improved. Latest hgbA1c is 5.4. She currently takes Lantus 30 units SQ Q HS.  PAST MEDICAL HISTORY:  Past Medical History  Diagnosis Date  . Anemia   . CAD (coronary artery disease)   . Peripheral vascular disease ([place])   . Hypertension   . Cancer [facility])     colon; s/p colectomy 2007  . Anemia, iron deficiency 01/17/2012  . Hemorrhoids   . Nosebleed   . Stroke ([place])   . Cellulitis 03/08/12  . Diabetes mellitus     IDDM ; Type 2 for 54 years  . Hypothyroidism     since childhood  . Shortness of breath dyspnea   . Depression   . Wound infection ([place])     on right foot   . Diabetic foot ulcers ([place])   . RBBB (right bundle branch block with left anterior fascicular block)   . Chronic kidney disease   . Edema of both legs     CURRENT MEDICATIONS: Reviewed per MAR/see medication   Medication List       This list is accurate as of: 04/19/15  3:13 PM.  Always use your most recent med list.               aspirin EC 81 MG tablet  Take 81 mg by mouth every evening.     BD PEN NEEDLE NANO U/F 32G X 4 MM Misc  Generic drug:  Insulin Pen Needle  Reported on 04/19/2015     diclofenac sodium 1 % Gel  Commonly known as:  VOLTAREN  Apply 2 g topically 2 (two) times daily. Apply to left shoulder topically BID     FLUoxetine 20 MG tablet  Commonly known as:  PROZAC  Take 20 mg by mouth daily.     hydrALAZINE 25 MG tablet    Commonly known as:  APRESOLINE  Take 25 mg by mouth 2 (two) times daily. Hold for SBP <110 or HR <60     HYDROPHOR Oint  Apply topically daily. To LLE     LANTUS SOLOSTAR 100 UNIT/ML Solostar Pen  Generic drug:  Insulin Glargine  Inject 25 Units into the skin daily at 10 pm.     levothyroxine 125 MCG tablet  Commonly known as:  SYNTHROID, LEVOTHROID  Take 125 mcg by mouth. Synthroid ONlY     meclizine 12.5 MG tablet  Commonly known as:  ANTIVERT  Take 12.5 mg by mouth every 8 (eight) hours as needed for dizziness.     nystatin powder  Commonly known as:  MYCOSTATIN  Apply topically 2 (two) times daily.     oxycodone 5 MG capsule  Commonly known as:  OXY-IR  Take 5 mg by mouth every 6 (six) hours as needed.     OXYGEN  Inhale 2 L  into the lungs. To keep sats at 88 or above     polyethylene glycol packet  Commonly known as:  MIRALAX / GLYCOLAX  Take 17 g by mouth 2 (two) times daily.     PROCEL PO  Take 1 scoop by mouth 2 (two) times daily.     PRODIGY NO CODING BLOOD GLUC test strip  Generic drug:  glucose blood  Reported on 04/19/2015     SALINE NASAL SPRAY NA  Place 2 sprays into the nose 2 (two) times daily.     sennosides-docusate sodium 8.6-50 MG tablet  Commonly known as:  SENOKOT-S  Take 1 tablet by mouth 2 (two) times daily.     sodium polystyrene 15 GM/60ML suspension  Commonly known as:  KAYEXALATE  Take 15 g by mouth once.     torsemide 20 MG tablet  Commonly known as:  DEMADEX  Take 20 mg by mouth daily.     VITAMIN D-3 PO  Take 5,000 Units by mouth daily. Take 5 tablets = 5000 MG            Allergies  Allergen Reactions  . Blackberry [Rubus Fruticosus]     Can't take because of Cancer  . Meloxicam Shortness Of Breath  . Procaine Hcl Swelling  . Alendronate Sodium Other (See Comments)    constipation  . Meloxicam      REVIEW OF SYSTEMS:  GENERAL: no change in appetite, no fatigue, no weight changes, no fever, chills or  weakness RESPIRATORY: no cough, SOB, DOE, wheezing, hemoptysis CARDIAC: no chest pain, or palpitations GI: no abdominal pain, diarrhea, heart burn, nausea or vomiting  PHYSICAL EXAMINATION  GENERAL: no acute distress, obese NECK: supple, trachea midline, no neck masses, no thyroid tenderness, no thyromegaly LYMPHATICS: no LAN in the neck, no supraclavicular LAN RESPIRATORY: breathing is even & unlabored, BS CTAB CARDIAC: RRR, no murmur,no extra heart sounds, LUE/LLE edema 2+ GI: abdomen soft, normal BS, no masses, no tenderness, no hepatomegaly, no splenomegaly EXTREMITIES:  Able to move all 4 extremities; right AKA; LLE covered with unna boot PSYCHIATRIC: the patient is alert & oriented to person, affect & behavior appropriate  LABS/RADIOLOGY: Labs reviewed: 04/18/15  TSH 5.464  hgbA1c 5.4 sodium 132 potassium 5.6 glucose 51 BUN 81 creatinine 2.30 calcium 8.8 total protein 6.0 albumin 3.0 for globulin 3.0 total bilirubin 0.78 alkaline phosphatase 90 SGOT 15 SGPT 5 pre-albumin 7.9 WBC 6.6 hemoglobin 9.0 hematocrit 29.4 MCV 94.5 platelet 139 01/25/15  sodium 139 potassium 4.3 glucose 60 BUN 17 creatinine 1.72 calcium 9.3 total protein 6.1 albumin 3.31 total bilirubin 0.64 alkaline phosphatase 113 SGOT 15 SGPT 9 WBC 6.3 hemoglobin 10.5 hematocrit 33.4 MCV 92.8 platelet 137 12/14/14  sodium 138 potassium 4.5 glucose 101 BUN 78 creatinine 1.69 calcium 9.2 albumin 3.49 11/04/14  WBC 6.5 hemoglobin 10.3 hematocrit 31.1 MCV 89.9 platelet count 141 09/21/14  WBC 6.2 hemoglobin 10.8 hematocrit 32.5 MCV 87.4 platelet count 150 sodium 139 potassium 4.3 glucose 60 BUN 75 creatinine 1.38 calcium 8.5 hemoglobin A1c 6.8 09/16/14  left elbow x-ray shows minor osteoarthritic changes and no acute fracture or dislocation; x-ray of left shoulder/humerus shows mild osteoarthritic changes/no acute fracture or dislocation 08/17/14  sodium 140 potassium 3.8 glucose 80 BUN 79 creatinine 1.42 calcium 9.4 07/20/14   parathyroid hormone 69 calcium 8.5 07/19/14  WBC 7.6 hemoglobin 9.9 hematocrit 31.1 MCV 88.9 TSH 3.121 platelet [id number] Basic Metabolic Panel:  Recent Labs  07/11/14 0450 07/13/14 0518 07/14/14 0353 01/25/15  NA 134* 137  136 139  K 4.8 4.2 4.0 4.3  CL 104 103 100*  --   CO2 21* 25 25  --   GLUCOSE 94 108* 116*  --   BUN 70* 78* 82* 70*  CREATININE 2.32* 1.83* 1.78* 1.7*  CALCIUM 8.1* 8.5* 8.7*  --   PHOS  --  4.3 4.2  --    Liver Function Tests:  Recent Labs  07/08/14 0406 07/09/14 0555 07/10/14 0413 07/13/14 0518 07/14/14 0353 01/25/15  AST 19 16 15   --   --  15  ALT 12 12 11   --   --  9  ALKPHOS 89 94 100  --   --  113  BILITOT 0.5 0.4 0.4  --   --   --   PROT 5.8* 6.2 6.1  --   --   --   ALBUMIN 2.2* 2.3* 2.2* 2.2* 2.3*  --    CBC:  Recent Labs  07/08/14 0406 07/09/14 0555 07/10/14 0413 01/25/15  WBC 8.8 7.2 5.9 6.3  HGB 9.0* 8.9* 9.0* 10.5*  HCT 29.1* 28.1* 28.5* 33*  MCV 90.9 90.1 89.3  --   PLT 160 171 173 137*    CBG:  Recent Labs  07/13/14 2029 07/14/14 0623 07/14/14 1131  GLUCAP 138* 102* 151*     ASSESSMENT/PLAN:  Diabetes mellitus, type II - hemoglobin A1c 5.4; decrease Lantus to 25 units subcutaneous daily at bedtime  Hyperkalemia - K5.6; give Kayexalate 15 g/60 mL by mouth 1; BMP in a.m.  Anasarca - decrease torsemide to 20 mg 1 tab by mouth daily; BMP in 1 week  Chronic kidney disease, stage III - creatinine 2.30; will monitor  Hyponatremia - sodium 132; will monitor   [employer], NP [place]

## 2015-04-20 ENCOUNTER — Other Ambulatory Visit: Payer: Self-pay | Admitting: *Deleted

## 2015-04-20 MED ORDER — OXYCODONE HCL 5 MG PO TABS: ORAL_TABLET | ORAL | Status: AC

## 2015-04-20 NOTE — Telephone Encounter (Signed)
[facility] 

## 2015-04-21 ENCOUNTER — Emergency Department (HOSPITAL_COMMUNITY): Payer: Medicare Other

## 2015-04-21 ENCOUNTER — Inpatient Hospital Stay (HOSPITAL_COMMUNITY): Payer: Medicare Other

## 2015-04-21 ENCOUNTER — Encounter (HOSPITAL_COMMUNITY): Payer: Self-pay | Admitting: Emergency Medicine

## 2015-04-21 ENCOUNTER — Inpatient Hospital Stay (HOSPITAL_COMMUNITY)
Admission: EM | Admit: 2015-04-21 | Discharge: 2015-05-11 | DRG: 291 | Disposition: E | Payer: Medicare Other | Attending: Internal Medicine | Admitting: Internal Medicine

## 2015-04-21 DIAGNOSIS — E1122 Type 2 diabetes mellitus with diabetic chronic kidney disease: Secondary | ICD-10-CM | POA: Diagnosis present

## 2015-04-21 DIAGNOSIS — Z515 Encounter for palliative care: Secondary | ICD-10-CM | POA: Diagnosis not present

## 2015-04-21 DIAGNOSIS — E11622 Type 2 diabetes mellitus with other skin ulcer: Secondary | ICD-10-CM | POA: Diagnosis present

## 2015-04-21 DIAGNOSIS — F329 Major depressive disorder, single episode, unspecified: Secondary | ICD-10-CM | POA: Diagnosis present

## 2015-04-21 DIAGNOSIS — J9621 Acute and chronic respiratory failure with hypoxia: Secondary | ICD-10-CM | POA: Diagnosis present

## 2015-04-21 DIAGNOSIS — N390 Urinary tract infection, site not specified: Secondary | ICD-10-CM

## 2015-04-21 DIAGNOSIS — Z79899 Other long term (current) drug therapy: Secondary | ICD-10-CM

## 2015-04-21 DIAGNOSIS — Z452 Encounter for adjustment and management of vascular access device: Secondary | ICD-10-CM

## 2015-04-21 DIAGNOSIS — Z79891 Long term (current) use of opiate analgesic: Secondary | ICD-10-CM

## 2015-04-21 DIAGNOSIS — N183 Chronic kidney disease, stage 3 unspecified: Secondary | ICD-10-CM | POA: Diagnosis present

## 2015-04-21 DIAGNOSIS — D509 Iron deficiency anemia, unspecified: Secondary | ICD-10-CM | POA: Diagnosis present

## 2015-04-21 DIAGNOSIS — I10 Essential (primary) hypertension: Secondary | ICD-10-CM | POA: Diagnosis present

## 2015-04-21 DIAGNOSIS — Z7982 Long term (current) use of aspirin: Secondary | ICD-10-CM | POA: Diagnosis not present

## 2015-04-21 DIAGNOSIS — R319 Hematuria, unspecified: Secondary | ICD-10-CM | POA: Diagnosis present

## 2015-04-21 DIAGNOSIS — Z89611 Acquired absence of right leg above knee: Secondary | ICD-10-CM | POA: Diagnosis not present

## 2015-04-21 DIAGNOSIS — I272 Other secondary pulmonary hypertension: Secondary | ICD-10-CM | POA: Diagnosis present

## 2015-04-21 DIAGNOSIS — I4891 Unspecified atrial fibrillation: Secondary | ICD-10-CM | POA: Diagnosis present

## 2015-04-21 DIAGNOSIS — R0600 Dyspnea, unspecified: Secondary | ICD-10-CM | POA: Diagnosis not present

## 2015-04-21 DIAGNOSIS — B965 Pseudomonas (aeruginosa) (mallei) (pseudomallei) as the cause of diseases classified elsewhere: Secondary | ICD-10-CM | POA: Diagnosis present

## 2015-04-21 DIAGNOSIS — I130 Hypertensive heart and chronic kidney disease with heart failure and stage 1 through stage 4 chronic kidney disease, or unspecified chronic kidney disease: Secondary | ICD-10-CM | POA: Diagnosis present

## 2015-04-21 DIAGNOSIS — Z794 Long term (current) use of insulin: Secondary | ICD-10-CM

## 2015-04-21 DIAGNOSIS — F0390 Unspecified dementia without behavioral disturbance: Secondary | ICD-10-CM | POA: Diagnosis present

## 2015-04-21 DIAGNOSIS — N179 Acute kidney failure, unspecified: Secondary | ICD-10-CM | POA: Diagnosis present

## 2015-04-21 DIAGNOSIS — L97929 Non-pressure chronic ulcer of unspecified part of left lower leg with unspecified severity: Secondary | ICD-10-CM | POA: Diagnosis present

## 2015-04-21 DIAGNOSIS — Z66 Do not resuscitate: Secondary | ICD-10-CM | POA: Diagnosis present

## 2015-04-21 DIAGNOSIS — R4182 Altered mental status, unspecified: Secondary | ICD-10-CM | POA: Diagnosis present

## 2015-04-21 DIAGNOSIS — E875 Hyperkalemia: Secondary | ICD-10-CM | POA: Diagnosis present

## 2015-04-21 DIAGNOSIS — J9622 Acute and chronic respiratory failure with hypercapnia: Secondary | ICD-10-CM | POA: Diagnosis present

## 2015-04-21 DIAGNOSIS — G9340 Encephalopathy, unspecified: Secondary | ICD-10-CM | POA: Diagnosis not present

## 2015-04-21 DIAGNOSIS — Q279 Congenital malformation of peripheral vascular system, unspecified: Secondary | ICD-10-CM

## 2015-04-21 DIAGNOSIS — S81809A Unspecified open wound, unspecified lower leg, initial encounter: Secondary | ICD-10-CM | POA: Diagnosis present

## 2015-04-21 DIAGNOSIS — Q8989 Other specified congenital malformations: Secondary | ICD-10-CM

## 2015-04-21 DIAGNOSIS — J189 Pneumonia, unspecified organism: Secondary | ICD-10-CM

## 2015-04-21 DIAGNOSIS — I5033 Acute on chronic diastolic (congestive) heart failure: Principal | ICD-10-CM | POA: Diagnosis present

## 2015-04-21 DIAGNOSIS — R680 Hypothermia, not associated with low environmental temperature: Secondary | ICD-10-CM | POA: Diagnosis present

## 2015-04-21 DIAGNOSIS — E039 Hypothyroidism, unspecified: Secondary | ICD-10-CM | POA: Diagnosis present

## 2015-04-21 DIAGNOSIS — R001 Bradycardia, unspecified: Secondary | ICD-10-CM | POA: Diagnosis not present

## 2015-04-21 DIAGNOSIS — E1151 Type 2 diabetes mellitus with diabetic peripheral angiopathy without gangrene: Secondary | ICD-10-CM | POA: Diagnosis present

## 2015-04-21 DIAGNOSIS — I4510 Unspecified right bundle-branch block: Secondary | ICD-10-CM | POA: Diagnosis present

## 2015-04-21 DIAGNOSIS — E1129 Type 2 diabetes mellitus with other diabetic kidney complication: Secondary | ICD-10-CM | POA: Diagnosis present

## 2015-04-21 DIAGNOSIS — Z7401 Bed confinement status: Secondary | ICD-10-CM

## 2015-04-21 DIAGNOSIS — Z85038 Personal history of other malignant neoplasm of large intestine: Secondary | ICD-10-CM

## 2015-04-21 DIAGNOSIS — Z6841 Body Mass Index (BMI) 40.0 and over, adult: Secondary | ICD-10-CM | POA: Diagnosis not present

## 2015-04-21 DIAGNOSIS — E785 Hyperlipidemia, unspecified: Secondary | ICD-10-CM | POA: Diagnosis present

## 2015-04-21 DIAGNOSIS — I2510 Atherosclerotic heart disease of native coronary artery without angina pectoris: Secondary | ICD-10-CM | POA: Diagnosis present

## 2015-04-21 DIAGNOSIS — J9601 Acute respiratory failure with hypoxia: Secondary | ICD-10-CM | POA: Diagnosis not present

## 2015-04-21 DIAGNOSIS — L03116 Cellulitis of left lower limb: Secondary | ICD-10-CM | POA: Diagnosis present

## 2015-04-21 DIAGNOSIS — Z8673 Personal history of transient ischemic attack (TIA), and cerebral infarction without residual deficits: Secondary | ICD-10-CM

## 2015-04-21 DIAGNOSIS — Z9981 Dependence on supplemental oxygen: Secondary | ICD-10-CM | POA: Diagnosis not present

## 2015-04-21 DIAGNOSIS — E872 Acidosis: Secondary | ICD-10-CM | POA: Diagnosis present

## 2015-04-21 DIAGNOSIS — I480 Paroxysmal atrial fibrillation: Secondary | ICD-10-CM | POA: Diagnosis not present

## 2015-04-21 DIAGNOSIS — Z01818 Encounter for other preprocedural examination: Secondary | ICD-10-CM

## 2015-04-21 DIAGNOSIS — F29 Unspecified psychosis not due to a substance or known physiological condition: Secondary | ICD-10-CM | POA: Diagnosis not present

## 2015-04-21 DIAGNOSIS — Z87891 Personal history of nicotine dependence: Secondary | ICD-10-CM

## 2015-04-21 DIAGNOSIS — G9349 Other encephalopathy: Secondary | ICD-10-CM | POA: Diagnosis present

## 2015-04-21 DIAGNOSIS — R601 Generalized edema: Secondary | ICD-10-CM | POA: Diagnosis present

## 2015-04-21 LAB — ALBUMIN: Albumin: 2.8 g/dL — ABNORMAL LOW (ref 3.5–5.0)

## 2015-04-21 LAB — BASIC METABOLIC PANEL
Anion gap: 14 (ref 5–15)
BUN: 96 mg/dL — AB (ref 6–20)
CALCIUM: 8.6 mg/dL — AB (ref 8.9–10.3)
CHLORIDE: 91 mmol/L — AB (ref 101–111)
CO2: 27 mmol/L (ref 22–32)
CREATININE: 2.83 mg/dL — AB (ref 0.44–1.00)
GFR calc non Af Amer: 15 mL/min — ABNORMAL LOW (ref >60)
GFR, EST AFRICAN AMERICAN: 17 mL/min — AB (ref >60)
GLUCOSE: 87 mg/dL (ref 65–99)
Potassium: 4.9 mmol/L (ref 3.5–5.1)
Sodium: 132 mmol/L — ABNORMAL LOW (ref 135–145)

## 2015-04-21 LAB — TSH: TSH: 4.399 u[IU]/mL (ref 0.350–4.500)

## 2015-04-21 LAB — I-STAT ARTERIAL BLOOD GAS, ED
Acid-Base Excess: 4 mmol/L — ABNORMAL HIGH (ref 0.0–2.0)
Acid-Base Excess: 7 mmol/L — ABNORMAL HIGH (ref 0.0–2.0)
BICARBONATE: 34.8 meq/L — AB (ref 20.0–24.0)
Bicarbonate: 33.8 mEq/L — ABNORMAL HIGH (ref 20.0–24.0)
O2 SAT: 97 %
O2 Saturation: 95 %
PCO2 ART: 83.7 mmHg — AB (ref 35.0–45.0)
PO2 ART: 96 mmHg (ref 80.0–100.0)
TCO2: 36 mmol/L (ref 0–100)
TCO2: 37 mmol/L (ref 0–100)
pCO2 arterial: 72.9 mmHg (ref 35.0–45.0)
pH, Arterial: 7.215 — ABNORMAL LOW (ref 7.350–7.450)
pH, Arterial: 7.287 — ABNORMAL LOW (ref 7.350–7.450)
pO2, Arterial: 105 mmHg — ABNORMAL HIGH (ref 80.0–100.0)

## 2015-04-21 LAB — URINE MICROSCOPIC-ADD ON

## 2015-04-21 LAB — URINALYSIS, ROUTINE W REFLEX MICROSCOPIC
GLUCOSE, UA: 100 mg/dL — AB
Ketones, ur: NEGATIVE mg/dL
Nitrite: POSITIVE — AB
PH: 6.5 (ref 5.0–8.0)
Protein, ur: >300 mg/dL — AB
SPECIFIC GRAVITY, URINE: 1.025 (ref 1.005–1.030)

## 2015-04-21 LAB — CBC WITH DIFFERENTIAL/PLATELET
BASOS ABS: 0 10*3/uL (ref 0.0–0.1)
Basophils Relative: 0 %
Eosinophils Absolute: 0.1 10*3/uL (ref 0.0–0.7)
Eosinophils Relative: 1 %
HEMATOCRIT: 27.4 % — AB (ref 36.0–46.0)
Hemoglobin: 8.7 g/dL — ABNORMAL LOW (ref 12.0–15.0)
LYMPHS PCT: 7 %
Lymphs Abs: 0.4 10*3/uL — ABNORMAL LOW (ref 0.7–4.0)
MCH: 29.3 pg (ref 26.0–34.0)
MCHC: 31.8 g/dL (ref 30.0–36.0)
MCV: 92.3 fL (ref 78.0–100.0)
Monocytes Absolute: 0.6 10*3/uL (ref 0.1–1.0)
Monocytes Relative: 10 %
NEUTROS ABS: 5.2 10*3/uL (ref 1.7–7.7)
Neutrophils Relative %: 82 %
PLATELETS: 121 10*3/uL — AB (ref 150–400)
RBC: 2.97 MIL/uL — AB (ref 3.87–5.11)
RDW: 15.5 % (ref 11.5–15.5)
WBC: 6.3 10*3/uL (ref 4.0–10.5)

## 2015-04-21 LAB — I-STAT TROPONIN, ED: Troponin i, poc: 0.08 ng/mL (ref 0.00–0.08)

## 2015-04-21 LAB — TROPONIN I: TROPONIN I: 0.1 ng/mL — AB (ref <0.031)

## 2015-04-21 LAB — POCT I-STAT 3, ART BLOOD GAS (G3+)
Acid-Base Excess: 7 mmol/L — ABNORMAL HIGH (ref 0.0–2.0)
Bicarbonate: 31.3 mEq/L — ABNORMAL HIGH (ref 20.0–24.0)
O2 SAT: 98 %
TCO2: 33 mmol/L (ref 0–100)
pCO2 arterial: 39.9 mmHg (ref 35.0–45.0)
pH, Arterial: 7.497 — ABNORMAL HIGH (ref 7.350–7.450)
pO2, Arterial: 98 mmHg (ref 80.0–100.0)

## 2015-04-21 LAB — I-STAT VENOUS BLOOD GAS, ED
ACID-BASE EXCESS: 5 mmol/L — AB (ref 0.0–2.0)
Bicarbonate: 34.4 mEq/L — ABNORMAL HIGH (ref 20.0–24.0)
O2 SAT: 69 %
PCO2 VEN: 83.4 mmHg — AB (ref 45.0–50.0)
TCO2: 37 mmol/L (ref 0–100)
pH, Ven: 7.223 — ABNORMAL LOW (ref 7.250–7.300)
pO2, Ven: 45 mmHg (ref 30.0–45.0)

## 2015-04-21 LAB — COMPREHENSIVE METABOLIC PANEL
ALT: 10 U/L — AB (ref 14–54)
AST: 16 U/L (ref 15–41)
Albumin: 3.1 g/dL — ABNORMAL LOW (ref 3.5–5.0)
Alkaline Phosphatase: 81 U/L (ref 38–126)
Anion gap: 12 (ref 5–15)
BILIRUBIN TOTAL: 1.1 mg/dL (ref 0.3–1.2)
BUN: 95 mg/dL — ABNORMAL HIGH (ref 6–20)
CALCIUM: 8.7 mg/dL — AB (ref 8.9–10.3)
CHLORIDE: 91 mmol/L — AB (ref 101–111)
CO2: 30 mmol/L (ref 22–32)
Creatinine, Ser: 3.02 mg/dL — ABNORMAL HIGH (ref 0.44–1.00)
GFR calc Af Amer: 16 mL/min — ABNORMAL LOW (ref >60)
GFR, EST NON AFRICAN AMERICAN: 13 mL/min — AB (ref >60)
Glucose, Bld: 132 mg/dL — ABNORMAL HIGH (ref 65–99)
Potassium: 5.3 mmol/L — ABNORMAL HIGH (ref 3.5–5.1)
Sodium: 133 mmol/L — ABNORMAL LOW (ref 135–145)
Total Protein: 6.7 g/dL (ref 6.5–8.1)

## 2015-04-21 LAB — TYPE AND SCREEN
ABO/RH(D): O POS
Antibody Screen: NEGATIVE

## 2015-04-21 LAB — ABO/RH: ABO/RH(D): O POS

## 2015-04-21 LAB — GLUCOSE, CAPILLARY: Glucose-Capillary: 85 mg/dL (ref 65–99)

## 2015-04-21 LAB — PROCALCITONIN: Procalcitonin: 0.34 ng/mL

## 2015-04-21 LAB — PROTIME-INR
INR: 1.22 (ref 0.00–1.49)
Prothrombin Time: 15.6 seconds — ABNORMAL HIGH (ref 11.6–15.2)

## 2015-04-21 LAB — LIPASE, BLOOD: LIPASE: 23 U/L (ref 11–51)

## 2015-04-21 LAB — I-STAT CG4 LACTIC ACID, ED: LACTIC ACID, VENOUS: 0.77 mmol/L (ref 0.5–2.0)

## 2015-04-21 LAB — MRSA PCR SCREENING: MRSA by PCR: POSITIVE — AB

## 2015-04-21 LAB — AMMONIA: Ammonia: 86 umol/L — ABNORMAL HIGH (ref 9–35)

## 2015-04-21 MED ORDER — FENTANYL BOLUS VIA INFUSION
25.0000 ug | INTRAVENOUS | Status: DC | PRN
  Filled 2015-04-21: qty 25

## 2015-04-21 MED ORDER — FUROSEMIDE 10 MG/ML IJ SOLN: 60.0000 mg | Freq: Two times a day (BID) | INTRAMUSCULAR | Status: DC

## 2015-04-21 MED ORDER — SODIUM CHLORIDE 0.9 % IV SOLN: 250.0000 mL | INTRAVENOUS | Status: DC | PRN

## 2015-04-21 MED ORDER — SODIUM CHLORIDE 0.9 % IV SOLN
0.0000 mg/h | INTRAVENOUS | Status: DC
  Administered 2015-04-21: 0.5 mg/h via INTRAVENOUS
  Administered 2015-04-22: 2 mg/h via INTRAVENOUS
  Filled 2015-04-21 (×2): qty 10

## 2015-04-21 MED ORDER — ONDANSETRON HCL 4 MG/2ML IJ SOLN: 4.0000 mg | Freq: Four times a day (QID) | INTRAMUSCULAR | Status: DC | PRN

## 2015-04-21 MED ORDER — LORAZEPAM 2 MG/ML IJ SOLN
0.5000 mg | Freq: Once | INTRAMUSCULAR | Status: AC
  Administered 2015-04-21: 0.5 mg via INTRAVENOUS

## 2015-04-21 MED ORDER — LORAZEPAM 2 MG/ML IJ SOLN
INTRAMUSCULAR | Status: AC
  Filled 2015-04-21: qty 1

## 2015-04-21 MED ORDER — SODIUM CHLORIDE 0.9 % IV SOLN
25.0000 ug/h | INTRAVENOUS | Status: DC
  Administered 2015-04-21: 50 ug/h via INTRAVENOUS
  Administered 2015-04-22 – 2015-04-23 (×3): 300 ug/h via INTRAVENOUS
  Filled 2015-04-21 (×5): qty 50

## 2015-04-21 MED ORDER — FENTANYL CITRATE (PF) 100 MCG/2ML IJ SOLN: 50.0000 ug | Freq: Once | INTRAMUSCULAR | Status: AC

## 2015-04-21 MED ORDER — SODIUM CHLORIDE 0.9 % IV BOLUS (SEPSIS)
500.0000 mL | Freq: Once | INTRAVENOUS | Status: AC
Start: 2015-04-21 — End: 2015-04-21
  Administered 2015-04-21: 500 mL via INTRAVENOUS

## 2015-04-21 MED ORDER — LACTULOSE ENEMA
300.0000 mL | Freq: Every day | ORAL | Status: DC
  Filled 2015-04-21: qty 300

## 2015-04-21 MED ORDER — INSULIN ASPART 100 UNIT/ML ~~LOC~~ SOLN: 0.0000 [IU] | Freq: Three times a day (TID) | SUBCUTANEOUS | Status: DC

## 2015-04-21 MED ORDER — SALINE SPRAY 0.65 % NA SOLN: 1.0000 | NASAL | Status: DC | PRN

## 2015-04-21 MED ORDER — SODIUM CHLORIDE 0.9% FLUSH
3.0000 mL | Freq: Two times a day (BID) | INTRAVENOUS | Status: DC
  Administered 2015-04-22 (×3): 3 mL via INTRAVENOUS

## 2015-04-21 MED ORDER — DICLOFENAC SODIUM 1 % TD GEL
2.0000 g | Freq: Two times a day (BID) | TRANSDERMAL | Status: DC
  Administered 2015-04-22 – 2015-04-23 (×3): 2 g via TOPICAL
  Filled 2015-04-21: qty 100

## 2015-04-21 MED ORDER — LACTULOSE ENEMA
300.0000 mL | Freq: Once | RECTAL | Status: DC
Start: 2015-04-21 — End: 2015-04-21
  Filled 2015-04-21: qty 300

## 2015-04-21 MED ORDER — HEPARIN SODIUM (PORCINE) 5000 UNIT/ML IJ SOLN
5000.0000 [IU] | Freq: Three times a day (TID) | INTRAMUSCULAR | Status: DC
  Administered 2015-04-22 – 2015-04-23 (×5): 5000 [IU] via SUBCUTANEOUS
  Filled 2015-04-21 (×8): qty 1

## 2015-04-21 MED ORDER — SODIUM CHLORIDE 0.9 % IV BOLUS (SEPSIS)
1000.0000 mL | Freq: Once | INTRAVENOUS | Status: AC
  Administered 2015-04-21: 1000 mL via INTRAVENOUS

## 2015-04-21 MED ORDER — PIPERACILLIN-TAZOBACTAM IN DEX 2-0.25 GM/50ML IV SOLN
2.2500 g | Freq: Four times a day (QID) | INTRAVENOUS | Status: DC
  Administered 2015-04-22 – 2015-04-23 (×7): 2.25 g via INTRAVENOUS
  Filled 2015-04-21 (×10): qty 50

## 2015-04-21 MED ORDER — PIPERACILLIN-TAZOBACTAM 3.375 G IVPB
3.3750 g | Freq: Three times a day (TID) | INTRAVENOUS | Status: DC
  Filled 2015-04-21: qty 50

## 2015-04-21 MED ORDER — NYSTATIN 100000 UNIT/GM EX POWD: Freq: Two times a day (BID) | CUTANEOUS | Status: DC

## 2015-04-21 MED ORDER — VANCOMYCIN HCL 10 G IV SOLR
1500.0000 mg | INTRAVENOUS | Status: DC
  Administered 2015-04-21: 1500 mg via INTRAVENOUS
  Filled 2015-04-21 (×2): qty 1500

## 2015-04-21 MED ORDER — INSULIN GLARGINE 100 UNIT/ML ~~LOC~~ SOLN: 12.0000 [IU] | Freq: Every day | SUBCUTANEOUS | Status: DC

## 2015-04-21 MED ORDER — DEXTROSE 5 % IV SOLN: 1.0000 g | INTRAVENOUS | Status: DC

## 2015-04-21 MED ORDER — FUROSEMIDE 10 MG/ML IJ SOLN
80.0000 mg | Freq: Once | INTRAMUSCULAR | Status: AC
  Administered 2015-04-21: 80 mg via INTRAVENOUS
  Filled 2015-04-21: qty 8

## 2015-04-21 MED ORDER — SODIUM CHLORIDE 0.9% FLUSH: 3.0000 mL | INTRAVENOUS | Status: DC | PRN

## 2015-04-21 MED ORDER — MIDAZOLAM HCL 2 MG/2ML IJ SOLN
4.0000 mg | Freq: Once | INTRAMUSCULAR | Status: AC
  Administered 2015-04-21: 4 mg via INTRAVENOUS

## 2015-04-21 MED ORDER — PIPERACILLIN-TAZOBACTAM 3.375 G IVPB 30 MIN
3.3750 g | Freq: Once | INTRAVENOUS | Status: DC
  Filled 2015-04-21: qty 50

## 2015-04-21 MED ORDER — LEVOTHYROXINE SODIUM 100 MCG IV SOLR
62.5000 ug | Freq: Every day | INTRAVENOUS | Status: DC
Start: 2015-04-21 — End: 2015-04-23
  Administered 2015-04-22: 62.5 ug via INTRAVENOUS
  Filled 2015-04-21 (×3): qty 5

## 2015-04-21 MED ORDER — ACETAMINOPHEN 325 MG PO TABS: 650.0000 mg | ORAL_TABLET | ORAL | Status: DC | PRN

## 2015-04-21 MED ORDER — INSULIN ASPART 100 UNIT/ML ~~LOC~~ SOLN: 0.0000 [IU] | Freq: Every day | SUBCUTANEOUS | Status: DC

## 2015-04-21 MED ORDER — HEPARIN SODIUM (PORCINE) 5000 UNIT/ML IJ SOLN: 5000.0000 [IU] | Freq: Three times a day (TID) | INTRAMUSCULAR | Status: DC

## 2015-04-21 MED ORDER — PANTOPRAZOLE SODIUM 40 MG IV SOLR
40.0000 mg | INTRAVENOUS | Status: DC
  Administered 2015-04-22: 40 mg via INTRAVENOUS
  Filled 2015-04-21 (×2): qty 40

## 2015-04-21 MED ORDER — FUROSEMIDE 10 MG/ML IJ SOLN: 60.0000 mg | Freq: Three times a day (TID) | INTRAMUSCULAR | Status: DC

## 2015-04-21 MED ORDER — ETOMIDATE 2 MG/ML IV SOLN
16.0000 mg | Freq: Once | INTRAVENOUS | Status: AC
  Administered 2015-04-21: 16 mg via INTRAVENOUS

## 2015-04-21 MED ORDER — FENTANYL CITRATE (PF) 100 MCG/2ML IJ SOLN
100.0000 ug | Freq: Once | INTRAMUSCULAR | Status: AC
  Administered 2015-04-21: 100 ug via INTRAVENOUS

## 2015-04-21 MED ORDER — INSULIN ASPART 100 UNIT/ML ~~LOC~~ SOLN: 2.0000 [IU] | SUBCUTANEOUS | Status: DC

## 2015-04-21 NOTE — ED Notes (Signed)
Attempted to give report 

## 2015-04-21 NOTE — ED Provider Notes (Addendum)
CSN: [id number]     Arrival date & time 05/10/2015  1001 History   First MD Initiated Contact with Patient 05/05/2015 1011     Chief Complaint  Patient presents with  . Weakness     (Consider location/radiation/quality/duration/timing/severity/associated sxs/prior Treatment) HPI   Patient is an 82 year old female presenting with increasing altered mental status. Patient has history of CAD, peripheral vascular disease, stroke, cellulitis, diabetes, chronic wound infection and CKD. Patient is unable to give any history. She is arousable and is able to state her name. She is protecting her airway. She was sent with very little information from her nursing home.\  Reviewing her last note it  appears that she has anasarca at baseline as well as a nonhealing wound on the left lower extremity. Patient on Demadex at home.   Level V caveat dementia and confusion.  Past Medical History  Diagnosis Date  . Anemia   . CAD (coronary artery disease)   . Peripheral vascular disease ([place])   . Hypertension   . Cancer [facility])     colon; s/p colectomy 2007  . Anemia, iron deficiency 01/17/2012  . Hemorrhoids   . Nosebleed   . Stroke ([place])   . Cellulitis 03/08/12  . Diabetes mellitus     IDDM ; Type 2 for 54 years  . Hypothyroidism     since childhood  . Shortness of breath dyspnea   . Depression   . Wound infection ([place])     on right foot   . Diabetic foot ulcers ([place])   . RBBB (right bundle branch block with left anterior fascicular block)   . Chronic kidney disease   . Edema of both legs    Past Surgical History  Procedure Laterality Date  . Appendectomy    . Cholecystectomy    . Tonsilectomy, adenoidectomy, bilateral myringotomy and tubes    . Colon surgery      for colon CA  . Lower extremity angiogram  September 01, 2012  . Aortogram  September 01, 2012  . Abdominal aortagram N/A 09/01/2012    Procedure: ABDOMINAL Maxcine Ham;  Surgeon: Angelia Mould, MD;  Location[facility] C[facility] LAB;  Service:  Cardiovascular;  Laterality: N/A;  . Lower extremity angiogram Bilateral 09/01/2012    Procedure: LOWER EXTREMITY ANGIOGRAM;  Surgeon: Angelia Mould, MD;  Location: [facility] CATH LAB;  Service: Cardiovascular;  Laterality: Bilateral;  . Amputation Right 07/06/2014    Procedure: RIGHT  ABOVE THE KNEE AMPUTATION;  Surgeon: Angelia Mould, MD;  Location: [facility] OR;  Service: Vascular;  Laterality: Right;   Family History  Problem Relation Age of Onset  . Cancer Mother   . Diabetes Mother   . Other Father     bleeding problems  . Diabetes Sister   . Heart disease Daughter     before age 20  . Cancer Son    Social History  Substance Use Topics  . Smoking status: Former Smoker -- 1.00 packs/day for 13 years    Types: Cigarettes    Start date: 01/09/1955    Quit date: 03/13/1967  . Smokeless tobacco: Never Used     Comment: quit  45 years ago  . Alcohol Use: No   OB History    No data available     Review of Systems    Allergies  Blackberry; Meloxicam; Procaine hcl; Alendronate sodium; and Meloxicam  Home Medications   Prior to Admission medications   Medication Sig Start Date End Date Taking? Authorizing Provider  aspirin EC 81 MG tablet Take 81 mg by mouth every evening.   Yes Historical Provider, MD  Cholecalciferol (VITAMIN D-3 PO) Take 5,000 Units by mouth daily. Take 5 tablets = 5000 MG   Yes Historical Provider, MD  diclofenac sodium (VOLTAREN) 1 % GEL Apply 2 g topically 2 (two) times daily. Apply to left shoulder topically BID   Yes Historical Provider, MD  FLUoxetine (PROZAC) 20 MG tablet Take 20 mg by mouth daily.   Yes Historical Provider, MD  hydrALAZINE (APRESOLINE) 25 MG tablet Take 25 mg by mouth 2 (two) times daily. Hold for SBP <110 or HR <60   Yes Historical Provider, MD  Insulin Glargine (LANTUS SOLOSTAR) 100 UNIT/ML Solostar Pen Inject 25 Units into the skin daily at 10 pm.   Yes Historical Provider, MD  levothyroxine (SYNTHROID, LEVOTHROID) 125 MCG  tablet Take 125 mcg by mouth. Synthroid ONlY   Yes Historical Provider, MD  oxyCODONE (ROXICODONE) 5 MG immediate release tablet Take one tablet by mouth twice daily for pain Patient taking differently: Take 5 mg by mouth 2 (two) times daily as needed for severe pain. Take one tablet by mouth twice daily for pain 04/20/15  Yes Estill Dooms, MD  polyethylene glycol [facility] / GLYCOLAX) packet Take 17 g by mouth 2 (two) times daily.   Yes Historical Provider, MD  Protein (PROCEL PO) Take 1 scoop by mouth 2 (two) times daily.   Yes Historical Provider, MD  SALINE NASAL SPRAY NA Place 2 sprays into the nose 2 (two) times daily.   Yes Historical Provider, MD  sennosides-docusate sodium (SENOKOT-S) 8.6-50 MG tablet Take 1 tablet by mouth 2 (two) times daily.   Yes Historical Provider, MD  torsemide (DEMADEX) 20 MG tablet Take 20 mg by mouth daily.    Yes Historical Provider, MD  BD PEN NEEDLE NANO U/F 32G X 4 MM MISC Reported on 04/19/2015 05/13/13   Historical Provider, MD  Emollient (HYDROPHOR) OINT Apply topically daily. To LLE    Historical Provider, MD  meclizine (ANTIVERT) 12.5 MG tablet Take 12.5 mg by mouth every 8 (eight) hours as needed for dizziness.    Historical Provider, MD  nystatin (MYCOSTATIN) powder Apply topically 2 (two) times daily.     Historical Provider, MD  oxycodone (OXY-IR) 5 MG capsule Take 5 mg by mouth every 6 (six) hours as needed.    Historical Provider, MD  OXYGEN Inhale 2 L into the lungs. To keep sats at 88 or above    Historical Provider, MD  PRODIGY NO CODING BLOOD GLUC test strip Reported on 04/19/2015 08/25/12   Historical Provider, MD  sodium polystyrene (KAYEXALATE) 15 GM/60ML suspension Take 15 g by mouth once.    Historical Provider, MD   BP 126/74 mmHg  Pulse 62  Temp(Src) 97.7 F (36.5 C) (Temporal)  Resp 14  SpO2 100% Physical Exam  Constitutional: She appears well-developed.  Morbidly obese 82 year old female with BKA on the right  HENT:  Head:  Normocephalic.  Right Ear: External ear normal.  Left Ear: External ear normal.  Mouth/Throat: Oropharynx is clear and moist.  Eyes: Conjunctivae are normal.  Scant crusting bilateral eyes.  Neck: Neck supple.  Cardiovascular: Normal rate.   Pulmonary/Chest: Effort normal. She has no wheezes. She has no rales.  Abdominal: There is no tenderness.  Morbidly obese abdomen with +2 pitting edema to umbilicus.  Musculoskeletal: She exhibits edema.  On patient's right BKA she has +3 pitting edema and using of fluid.  On patient's left lower extremity it is wrapped and roughly 1/4 at the diameter of her upper left lower extremity which is unwrapped. It is also oozing with +3 pitting edema.  Neurological:  Oriented to name only.  Skin:  Patient has taut skin with oozing fluid diffusely.    ED Course  Procedures (including critical care time) Labs Review Labs Reviewed  CBC WITH DIFFERENTIAL/PLATELET - Abnormal; Notable for the following:    RBC 2.97 (*)    Hemoglobin 8.7 (*)    HCT 27.4 (*)    Platelets 121 (*)    Lymphs Abs 0.4 (*)    All other components within normal limits  COMPREHENSIVE METABOLIC PANEL - Abnormal; Notable for the following:    Sodium 133 (*)    Potassium 5.3 (*)    Chloride 91 (*)    Glucose, Bld 132 (*)    BUN 95 (*)    Creatinine, Ser 3.02 (*)    Calcium 8.7 (*)    Albumin 3.1 (*)    ALT 10 (*)    GFR calc non Af Amer 13 (*)    GFR calc Af Amer 16 (*)    All other components within normal limits  AMMONIA - Abnormal; Notable for the following:    Ammonia 86 (*)    All other components within normal limits  PROTIME-INR - Abnormal; Notable for the following:    Prothrombin Time 15.6 (*)    All other components within normal limits  I-STAT VENOUS BLOOD GAS, ED - Abnormal; Notable for the following:    pH, Ven 7.223 (*)    pCO2, Ven 83.4 (*)    Bicarbonate 34.4 (*)    Acid-Base Excess 5.0 (*)    All other components within normal limits  URINE CULTURE   LIPASE, BLOOD  URINALYSIS, ROUTINE W REFLEX MICROSCOPIC (NOT AT [facility])  I-STAT TROPOININ, ED  I-STAT CG4 LACTIC ACID, ED    Imaging Review Dg Chest Portable 1 View  05/02/2015  CLINICAL DATA:  SOB,HX HTN,STROKE,DM,CKD.''''COULD NOT GET PT FLAT FOR X-RAY''' EXAM: PORTABLE CHEST 1 VIEW COMPARISON:  07/07/2014 FINDINGS: Mildly degraded exam due to AP portable technique and patient body habitus. Patient rotated left. Cardiomegaly accentuated by AP portable technique. Probable layering bilateral pleural effusions, similar. No pneumothorax. Interstitial edema is moderate and progressive. Lower lobe predominant airspace opacities are similar on the left and worse on the right. IMPRESSION: Overall worsened aeration with new or increased congestive heart failure and similar layering bilateral pleural effusions. Similar left and worsened right base airspace disease which could represent atelectasis concurrent infection. Electronically Signed   By: Abigail Miyamoto M.D.   On: 05/08/2015 11:14   I have personally reviewed and evaluated these images and lab results as part of my medical decision-making.   EKG Interpretation   Date/Time:  Thursday April 21 2015 10:12:07 EST Ventricular Rate:  63 PR Interval:    QRS Duration: 185 QT Interval:  471 QTC Calculation: 482 R Axis:   -114 Text Interpretation:  Atrial fibrillation RBBB and LAFB Right bundle  branch block No significant change since last tracing Confirmed by  Gerald Leitz ([id number]) on 05/10/2015 10:58:59 AM      MDM   Final diagnoses:  None   patient is a very ill-appearing 82 year old female presenting with altered mental status. Patient has anasarca. She has taut skin diffusely oozing fluid from fluid overload. Patient is alert only towards person.  Right IJ placed by me.  We will start workup broadly.  Concerned about patient's mental status in the setting of such fluid overload. Patient on 3 L home O2. We will get gas.  11:49  AM Gas shows acidosis with elevated PCO2 of 83. Given patient's body habitus should probably runs high but with mild confusion we will place her on BiPAP to see if it improves.  Patient will require admission given the level of anasarca present.  CRITICAL CARE Performed by: Gardiner Sleeper Total critical care time: 30 minutes Critical care time was exclusive of separately billable procedures and treating other patients. Critical care was necessary to treat or prevent imminent or life-threatening deterioration. Critical care was time spent personally by me on the following activities: development of treatment plan with patient and/or surrogate as well as nursing, discussions with consultants, evaluation of patient's response to treatment, examination of patient, obtaining history from patient or surrogate, ordering and performing treatments and interventions, ordering and review of laboratory studies, ordering and review of radiographic studies, pulse oximetry and re-evaluation of patient's condition.    11:49 AM Discussed with hosptilist. Will admit to stepdown. On  Bipap. Will give lasix 80 to start diuresis. AMS likely multifactorial, hypercapnia, elevated ammonia.   Taquita Demby Julio Alm, MD 04/15/2015 1149  Attempted to call sons multiple times. No answer.  Crit care now seeing patient.    Pt improved on bipap, Improved clinically very well, alert and talking  but then tried to remove it.  Now confused again.  Will try bipap a second time.   Jasara Corrigan Julio Alm, MD 05/04/2015 1431

## 2015-04-21 NOTE — ED Notes (Signed)
Pt tolerating Bipap well  

## 2015-04-21 NOTE — Progress Notes (Signed)
eLink Physician-Brief Progress Note Patient Name: Sandra [name] DOB: September 02, 1930 MRN: [id number]   Date of Service  04/15/2015  HPI/Events of Note  Agitation/Delirium.   eICU Interventions  Will add Versed IV infusion Titrate to RASS = -2.     Intervention Category Major Interventions: Delirium, psychosis, severe agitation - evaluation and management  Azim Gillingham [place] 04/22/2015, 11:05 PM

## 2015-04-21 NOTE — Consult Note (Signed)
CARDIOLOGY CONSULT NOTE   Patient ID: Sandra [name] MRN: [id number] DOB/AGE: 1930/04/18 82 y.o.  Admit date: 04/13/2015  Primary Physician    Melinda Crutch, MD Primary Cardiologist   Dr. Marlou Porch Reason for Consultation   CHF Requesting Physician Dr. Waldron Labs  HPI: Sandra [name] is a 82 y.o. female with a history of CAD, PVD followed by Dr. Scot Dock, s/p R BKA, colon cancer s/p colectomy, HTN, stroke, hypothyroidism, moderate mitral annular calcification, CKD, chronic respiratory failure on 3 L oxygen, DM with foot ulcer and IDA brought to ER from facility for acute encephalopathy.   Last seen by Dr. Marlou Porch 06/15/2014 for pre-op clearance of AKA of limb. Echocardiogram 06/15/2014 showed left ventricular function 55-60%, moderate LVH, elevated end-diastolic pressure, mild to moderate mitral regurgitation with severely calcification, moderately dilated left atrium, mildly dilated right atrium, PA peak pressure of 75 mmHg.  History obtained from records. The patient is from [facility] brought via [place] for 1 week of weakness and increasing lethargy. Also labored breathing and acute encephalopathy. I&O cath 2 days ago with removal of 500 mL of bloody urine. Given 15mg  of Kayexalate for potassium of 5.7.  Workup revealted acute on chronic respiratory failure with hypoxia and hypercapnia/elevated ammonia. Patient has anasarca. The patient placed on BiPAP and given 80 mg of IV Lasix. Blood pressure of 126/74 with a pulse of 64. Urinalysis shows signs of infection. Lactic acid normal. Chest x-ray shows AVMs of CHF with bilateral pulmonary effusion and could be the infection as well.  After removal of BiPAP patient become agitated and pulling out lines. Given Ativan and now somnolent. Not answering any questions. EKG read as atrial fibrillation with right bundle branch block, however seen intermittent P waves? Vs junction rhythm.  Prior to admission patient was on Demadex 20 mg daily.    Past  Medical History  Diagnosis Date  . Anemia   . CAD (coronary artery disease)   . Peripheral vascular disease ([place])   . Hypertension   . Cancer [facility])     colon; s/p colectomy 2007  . Anemia, iron deficiency 01/17/2012  . Hemorrhoids   . Nosebleed   . Stroke ([place])   . Cellulitis 03/08/12  . Diabetes mellitus     IDDM ; Type 2 for 54 years  . Hypothyroidism     since childhood  . Shortness of breath dyspnea   . Depression   . Wound infection ([place])     on right foot   . Diabetic foot ulcers ([place])   . RBBB (right bundle branch block with left anterior fascicular block)   . Chronic kidney disease   . Edema of both legs      Past Surgical History  Procedure Laterality Date  . Appendectomy    . Cholecystectomy    . Tonsilectomy, adenoidectomy, bilateral myringotomy and tubes    . Colon surgery      for colon CA  . Lower extremity angiogram  September 01, 2012  . Aortogram  September 01, 2012  . Abdominal aortagram N/A 09/01/2012    Procedure: ABDOMINAL Maxcine Ham;  Surgeon: Angelia Mould, MD;  Location[facility] C[facility] LAB;  Service: Cardiovascular;  Laterality: N/A;  . Lower extremity angiogram Bilateral 09/01/2012    Procedure: LOWER EXTREMITY ANGIOGRAM;  Surgeon: Angelia Mould, MD;  Location: [facility] CATH LAB;  Service: Cardiovascular;  Laterality: Bilateral;  . Amputation Right 07/06/2014    Procedure: RIGHT  ABOVE THE KNEE AMPUTATION;  Surgeon: Judeth Cornfield  Scot Dock, MD;  Location: [place];  Service: Vascular;  Laterality: Right;    Allergies  Allergen Reactions  . Blackberry [Rubus Fruticosus]     Can't take because of Cancer  . Meloxicam Shortness Of Breath  . Procaine Hcl Swelling  . Alendronate Sodium Other (See Comments)    constipation  . Meloxicam     I have reviewed the patient's current medications . diclofenac sodium  2 g Topical BID  . furosemide  60 mg Intravenous TID  . insulin aspart  0-15 Units Subcutaneous TID WC  . insulin aspart  0-5 Units Subcutaneous QHS    . insulin glargine  12 Units Subcutaneous QHS  . lactulose  300 mL Rectal Once  . nystatin   Topical BID  . sodium chloride flush  3 mL Intravenous Q12H   . sodium chloride     sodium chloride, acetaminophen, ondansetron (ZOFRAN) IV, sodium chloride, sodium chloride flush  Prior to Admission medications   Medication Sig Start Date End Date Taking? Authorizing Provider  aspirin EC 81 MG tablet Take 81 mg by mouth every evening.   Yes Historical Provider, MD  Cholecalciferol (VITAMIN D-3 PO) Take 5,000 Units by mouth daily. Take 5 tablets = 5000 MG   Yes Historical Provider, MD  diclofenac sodium (VOLTAREN) 1 % GEL Apply 2 g topically 2 (two) times daily. Apply to left shoulder topically BID   Yes Historical Provider, MD  FLUoxetine (PROZAC) 20 MG tablet Take 20 mg by mouth daily.   Yes Historical Provider, MD  hydrALAZINE (APRESOLINE) 25 MG tablet Take 25 mg by mouth 2 (two) times daily. Hold for SBP <110 or HR <60   Yes Historical Provider, MD  Insulin Glargine (LANTUS SOLOSTAR) 100 UNIT/ML Solostar Pen Inject 25 Units into the skin daily at 10 pm.   Yes Historical Provider, MD  levothyroxine (SYNTHROID, LEVOTHROID) 125 MCG tablet Take 125 mcg by mouth. Synthroid ONlY   Yes Historical Provider, MD  oxyCODONE (ROXICODONE) 5 MG immediate release tablet Take one tablet by mouth twice daily for pain Patient taking differently: Take 5 mg by mouth 2 (two) times daily as needed for severe pain. Take one tablet by mouth twice daily for pain 04/20/15  Yes Estill Dooms, MD  polyethylene glycol [facility] / GLYCOLAX) packet Take 17 g by mouth 2 (two) times daily.   Yes Historical Provider, MD  Protein (PROCEL PO) Take 1 scoop by mouth 2 (two) times daily.   Yes Historical Provider, MD  SALINE NASAL SPRAY NA Place 2 sprays into the nose 2 (two) times daily.   Yes Historical Provider, MD  sennosides-docusate sodium (SENOKOT-S) 8.6-50 MG tablet Take 1 tablet by mouth 2 (two) times daily.   Yes Historical  Provider, MD  torsemide (DEMADEX) 20 MG tablet Take 20 mg by mouth daily.    Yes Historical Provider, MD  BD PEN NEEDLE NANO U/F 32G X 4 MM MISC Reported on 04/19/2015 05/13/13   Historical Provider, MD  Emollient (HYDROPHOR) OINT Apply topically daily. To LLE    Historical Provider, MD  meclizine (ANTIVERT) 12.5 MG tablet Take 12.5 mg by mouth every 8 (eight) hours as needed for dizziness.    Historical Provider, MD  nystatin (MYCOSTATIN) powder Apply topically 2 (two) times daily.     Historical Provider, MD  oxycodone (OXY-IR) 5 MG capsule Take 5 mg by mouth every 6 (six) hours as needed.    Historical Provider, MD  OXYGEN Inhale 2 L into the lungs. To keep  sats at 88 or above    Historical Provider, MD  PRODIGY NO CODING BLOOD GLUC test strip Reported on 04/19/2015 08/25/12   Historical Provider, MD  sodium polystyrene (KAYEXALATE) 15 GM/60ML suspension Take 15 g by mouth once.    Historical Provider, MD     Social History   Social History  . Marital Status: Divorced    Spouse Name: N/A  . Number of Children: N/A  . Years of Education: N/A   Occupational History  . Not on file.   Social History Main Topics  . Smoking status: Former Smoker -- 1.00 packs/day for 13 years    Types: Cigarettes    Start date: 01/09/1955    Quit date: 03/13/1967  . Smokeless tobacco: Never Used     Comment: quit  45 years ago  . Alcohol Use: No  . Drug Use: No  . Sexual Activity: Not on file   Other Topics Concern  . Not on file   Social History Narrative    Family Status  Relation Status Death Age  . Mother Deceased   . Father Deceased    Family History  Problem Relation Age of Onset  . Cancer Mother   . Diabetes Mother   . Other Father     bleeding problems  . Diabetes Sister   . Heart disease Daughter     before age 73  . Cancer Son       ROS:  Full 14 point review of systems complete and found to be negative unless listed above.  Physical Exam: Blood pressure 106/87, pulse 64,  temperature 97 F (36.1 C), temperature source Rectal, resp. rate 16, SpO2 100 %.  General: Ill appearing moderately obese woman laying on bed with open mouth. Off BiPAP.  Head: Eyes PERRLA, No xanthomas. Normocephalic and atraumatic, oropharynx without edema or exudate.  Lungs: Resp regular and unlabored. Off BiPAP. Anterior auscultation clear. Heart: RRR no s3, s4. Systolic murmurs. Neck: No carotid bruits. No lymphadenopathy.  Unable to assess JVD due to quit and Abdomen: Bowel sounds present, abdomen soft and non-tender without masses  Msk:  No spine or cva tenderness. No weakness, no joint deformities or effusions. Extremities: No clubbing, cyanosis. Right BKA. Left lower extremity with rapid. Oozing pitting edema up to mid abdomen. Neuro:  Somnolent and lethargic. Responsive to painful stimuli. No focal deficits noted. Psych:  Unable to assess Skin: No rashes or lesions noted.  Labs:   Lab Results  Component Value Date   WBC 6.3 04/18/2015   HGB 8.7* 04/22/2015   HCT 27.4* 04/26/2015   MCV 92.3 04/16/2015   PLT 121* 04/29/2015    Recent Labs  04/28/2015 1023  INR 1.22    Recent Labs Lab 04/20/2015 1023  NA 133*  K 5.3*  CL 91*  CO2 30  BUN 95*  CREATININE 3.02*  CALCIUM 8.7*  PROT 6.7  BILITOT 1.1  ALKPHOS 81  ALT 10*  AST 16  GLUCOSE 132*  ALBUMIN 3.1*   No results found for: MG No results for input(s): CKTOTAL, CKMB, TROPONINI in the last 72 hours.  Recent Labs  05/01/2015 1037  TROPIPOC 0.08   Echo: 06/15/14 LV EF: 55% -  60%  ------------------------------------------------------------------- Indications:   (I05.9).  ------------------------------------------------------------------- History:  PMH: Acquired from the patient and from the patient&'s chart. Coronary artery disease. Mitral valve disease. Risk factors: Former tobacco use. Hypertension. Diabetes mellitus. Morbidly obese.  Dyslipidemia.  ------------------------------------------------------------------- Study Conclusions  - Left ventricle: Wall thickness  was increased in a pattern of moderate LVH. Systolic function was normal. The estimated ejection fraction was in the range of 55% to 60%. Doppler parameters are consistent with elevated ventricular end-diastolic filling pressure. - Mitral valve: Severely calcified annulus. There was mild to moderate regurgitation. - Left atrium: The atrium was moderately dilated. - Right atrium: The atrium was mildly dilated. - Tricuspid valve: There was moderate regurgitation. - Pulmonary arteries: PA peak pressure: 75 mm Hg (S). - Pericardium, extracardiac: A trivial pericardial effusion was identified.  ECG:  Vent. rate 63 BPM PR interval * ms QRS duration 185 ms QT/QTc 471/482 ms P-R-T axes -1 246  Radiology:  Dg Chest Portable 1 View  05/05/2015  CLINICAL DATA:  SOB,HX HTN,STROKE,DM,CKD.''''COULD NOT GET PT FLAT FOR X-RAY''' EXAM: PORTABLE CHEST 1 VIEW COMPARISON:  07/07/2014 FINDINGS: Mildly degraded exam due to AP portable technique and patient body habitus. Patient rotated left. Cardiomegaly accentuated by AP portable technique. Probable layering bilateral pleural effusions, similar. No pneumothorax. Interstitial edema is moderate and progressive. Lower lobe predominant airspace opacities are similar on the left and worse on the right. IMPRESSION: Overall worsened aeration with new or increased congestive heart failure and similar layering bilateral pleural effusions. Similar left and worsened right base airspace disease which could represent atelectasis concurrent infection. Electronically Signed   By: Abigail Miyamoto M.D.   On: 05/07/2015 11:14    ASSESSMENT AND PLAN:     Sandra [name] is a 82 y.o. female with a history of CAD, PVD followed by Dr. Scot Dock,  s/p R AKA, colon cancer s/p colectomy, HTN, stroke, hypothyroidism, moderate mitral annular  calcification, CKD, chronic respiratory failure on 3 L oxygen, DM with foot ulcer and IDA brought to ER from facility for acute encephalopathy.   Principal Problem:   Acute respiratory failure with hypoxia (HCC) Active Problems:   DM type 2 causing renal disease (HCC)   Anemia, iron deficiency   Hyperkalemia   Morbid obesity ([place])   Essential hypertension   Hypothyroidism   Non-healing wound of lower extremity   Acute renal failure superimposed on stage 3 chronic kidney disease (HCC)   Acute on chronic diastolic HF (heart failure) (HCC)   A-fib (HCC)   Altered mental status   Acute encephalopathy   Anasarca   Hematuria  Workup revealed acute on chronic respiratory failure with hypoxia and hypercapnia/elevated ammonia. Patient has anasarca and UTI. The patient placed on BiPAP and given 80 mg of IV Lasix.  The patient become agitated after removing BiPAP and given Ativan. Now somnolent and legthergic. Restarted BiPAP. Questionable intubation required.  Echocardiogram 06/15/2014 showed left ventricular function 55-60%, moderate LVH, elevated end-diastolic pressure, mild to moderate mitral regurgitation with severely calcification, moderately dilated left atrium, mildly dilated right atrium, PA peak pressure of 75 mmHg. EKG read as a A. Fib, however seems junctional rhythm. Regular rate rhythm on exam. Wide complex QRS likely from hyperkalemia. Repeat EKG in the morning. Continue IV diuresis. M.D. to see. Medical therapy likely.   SignedLeanor Kail, PA 04/26/2015, 1:46 PM  Co-Sign MD  Patient seen, examined. Available data reviewed. Agree with findings, assessment, and plan as outlined by Robbie Lis, PA-C. On exam, the patient is an obese woman, very somnolent, with BiPAP in place. She does not respond to verbal stimuli. Lung fields are clear. Heart is regular rate and rhythm. Abdomen is obese. There is diffuse anasarca and the right BKA site has some clear is oozing.  This patient  has acute on chronic  respiratory failure, acute on chronic kidney injury with hyperkalemia, and acute on chronic diastolic heart failure. She has received IV Ativan and is now heavily sedated but tolerating BiPAP much better. Hopefully this will turn her respiratory acidosis around. From a cardiac perspective, I think all we had to offer his IV diuresis as tolerated. Her echo from last year showed changes of severe pulmonary hypertension. We will update this. Her heart rhythm appears to be junctional and it may be related to hyperkalemia, but I do not think she is currently in atrial fibrillation. We will follow with you. I think this patient has an extremely poor prognosis.  Sherren Mocha, M.D. 05/01/2015 2:42 PM

## 2015-04-21 NOTE — Procedures (Signed)
Intubation Procedure Note SEMIA KAPPEL [id number] 1930-07-12  Procedure: Intubation Indications: Respiratory insufficiency  Procedure Details Consent: Unable to obtain consent because of emergent medical necessity. Time Out: Verified patient identification, verified procedure, site/side was marked, verified correct patient position, special equipment/implants available, medications/allergies/relevent history reviewed, required imaging and test results available.  Performed  Maximum sterile technique was used including gloves, gown, hand hygiene and mask.  MAC and 3    Evaluation Hemodynamic Status: BP stable throughout; O2 sats: stable throughout Patient's Current Condition: stable Complications: No apparent complications Patient did tolerate procedure well. Chest X-ray ordered to verify placement.  CXR: pending.   Raylene Miyamoto 04/22/2015  Unresponsive on bipap, failing  [name]. [name], MD, [place] [facility]: [facility]

## 2015-04-21 NOTE — ED Notes (Signed)
Pt less responsive, RT called to restart Bi-pap

## 2015-04-21 NOTE — ED Notes (Signed)
Pt increasingly drowsy, Bi-pap to be restarted

## 2015-04-21 NOTE — H&P (Signed)
Sandra [name] History and Physical  Sandra TE [id number] DOB: 1930/11/17 DOA: 05/06/2015  Referring physician: Real Cons PCP:  Melinda Crutch, MD   Chief Complaint: acute encephalopathy/acute on chronic respiratory failure/anasarce  HPI: Sandra [name] is [name] 82 y.o. female with [name] past medical history that includes insulin-dependent diabetes, PVD, CVA, chronic diabetic ulcers, colon cancer status post colectomy hyperkalemia diastolic dysfunction chronic respiratory failure on 3 L at facility since emergency department with the chief complaint of acute encephalopathy related to acute on chronic respiratory failure with hypoxia and hypercapnia/elevated ammonia secondary to acute diastolic heart failure.  Information is obtained from the chart and the nursing staff at the facility. Reportedly she has not felt well for the last 3-5 days. Respirations described as "more laborious" and "using abdominal muscles". Associated symptoms include acute encephalopathy this morning. No report of cough fever chills. No complaints of abdominal pain nausea vomiting diarrhea. RN reported inserting [name] Foley 2 days ago and got back 500 mL of bloody urine. In addition she had [name] potassium of 5.7 and was given 15 g of Kayexalate. RN reports that one was obvious she was not getting any better and became encephalopathic they decided to call EMS.  In the emergency department she is provided with BiPAP and 80 mg of Lasix. She is hypothermic with [name] rectal temp of 97 blood pressure and 124/55 heart rate of 63.  Review of Systems:  Able to complete [name] 10 point review of systems with the patient but did interview staff at her nursing facility all are negative except as indicated in the history of present illness Past Medical History  Diagnosis Date  . Anemia   . CAD (coronary artery disease)   . Peripheral vascular disease ([place])   . Hypertension   . Cancer [facility])     colon; s/p colectomy 2007  . Anemia, iron deficiency  01/17/2012  . Hemorrhoids   . Nosebleed   . Stroke ([place])   . Cellulitis 03/08/12  . Diabetes mellitus     IDDM ; Type 2 for 54 years  . Hypothyroidism     since childhood  . Shortness of breath dyspnea   . Depression   . Wound infection ([place])     on right foot   . Diabetic foot ulcers ([place])   . RBBB (right bundle branch block with left anterior fascicular block)   . Chronic kidney disease   . Edema of both legs    Past Surgical History  Procedure Laterality Date  . Appendectomy    . Cholecystectomy    . Tonsilectomy, adenoidectomy, bilateral myringotomy and tubes    . Colon surgery      for colon CA  . Lower extremity angiogram  September 01, 2012  . Aortogram  September 01, 2012  . Abdominal aortagram N/[name] 09/01/2012    Procedure: ABDOMINAL Maxcine Ham;  Surgeon: Angelia Mould, MD;  Location: [facility] CATH LAB;  Service: Cardiovascular;  Laterality: N/[name];  . Lower extremity angiogram Bilateral 09/01/2012    Procedure: LOWER EXTREMITY ANGIOGRAM;  Surgeon: Angelia Mould, MD;  Location: [facility] CATH LAB;  Service: Cardiovascular;  Laterality: Bilateral;  . Amputation Right 07/06/2014    Procedure: RIGHT  ABOVE THE KNEE AMPUTATION;  Surgeon: Angelia Mould, MD;  Location: [facility];  Service: Vascular;  Laterality: Right;   Social History:  reports that she quit smoking about 48 years ago. Her smoking use included Cigarettes. She started smoking about 60 years ago. She has [name] 13 pack-year  smoking history. She has never used smokeless tobacco. She reports that she does not drink alcohol or use illicit drugs. She is [name] resident of [facility] facility Allergies  Allergen Reactions  . Blackberry [Rubus Fruticosus]     Can't take because of Cancer  . Meloxicam Shortness Of Breath  . Procaine Hcl Swelling  . Alendronate Sodium Other (See Comments)    constipation  . Meloxicam     Family History  Problem Relation Age of Onset  . Cancer Mother   . Diabetes Mother   . Other Father      bleeding problems  . Diabetes Sister   . Heart disease Daughter     before age 24  . Cancer Son      Prior to Admission medications   Medication Sig Start Date End Date Taking? Authorizing Provider  aspirin EC 81 MG tablet Take 81 mg by mouth every evening.   Yes Historical Provider, MD  Cholecalciferol (VITAMIN D-3 PO) Take 5,000 Units by mouth daily. Take 5 tablets = 5000 MG   Yes Historical Provider, MD  diclofenac sodium (VOLTAREN) 1 % GEL Apply 2 g topically 2 (two) times daily. Apply to left shoulder topically BID   Yes Historical Provider, MD  FLUoxetine (PROZAC) 20 MG tablet Take 20 mg by mouth daily.   Yes Historical Provider, MD  hydrALAZINE (APRESOLINE) 25 MG tablet Take 25 mg by mouth 2 (two) times daily. Hold for SBP <110 or HR <60   Yes Historical Provider, MD  Insulin Glargine (LANTUS SOLOSTAR) 100 UNIT/ML Solostar Pen Inject 25 Units into the skin daily at 10 pm.   Yes Historical Provider, MD  levothyroxine (SYNTHROID, LEVOTHROID) 125 MCG tablet Take 125 mcg by mouth. Synthroid ONlY   Yes Historical Provider, MD  oxyCODONE (ROXICODONE) 5 MG immediate release tablet Take one tablet by mouth twice daily for pain Patient taking differently: Take 5 mg by mouth 2 (two) times daily as needed for severe pain. Take one tablet by mouth twice daily for pain 04/20/15  Yes Estill Dooms, MD  polyethylene glycol [facility] / GLYCOLAX) packet Take 17 g by mouth 2 (two) times daily.   Yes Historical Provider, MD  Protein (PROCEL PO) Take 1 scoop by mouth 2 (two) times daily.   Yes Historical Provider, MD  SALINE NASAL SPRAY NA Place 2 sprays into the nose 2 (two) times daily.   Yes Historical Provider, MD  sennosides-docusate sodium (SENOKOT-S) 8.6-50 MG tablet Take 1 tablet by mouth 2 (two) times daily.   Yes Historical Provider, MD  torsemide (DEMADEX) 20 MG tablet Take 20 mg by mouth daily.    Yes Historical Provider, MD  BD PEN NEEDLE NANO U/F 32G X 4 MM MISC Reported on 04/19/2015 05/13/13    Historical Provider, MD  Emollient (HYDROPHOR) OINT Apply topically daily. To LLE    Historical Provider, MD  meclizine (ANTIVERT) 12.5 MG tablet Take 12.5 mg by mouth every 8 (eight) hours as needed for dizziness.    Historical Provider, MD  nystatin (MYCOSTATIN) powder Apply topically 2 (two) times daily.     Historical Provider, MD  oxycodone (OXY-IR) 5 MG capsule Take 5 mg by mouth every 6 (six) hours as needed.    Historical Provider, MD  OXYGEN Inhale 2 L into the lungs. To keep sats at 88 or above    Historical Provider, MD  PRODIGY NO CODING BLOOD GLUC test strip Reported on 04/19/2015 08/25/12   Historical Provider, MD  sodium polystyrene (  KAYEXALATE) 15 GM/60ML suspension Take 15 g by mouth once.    Historical Provider, MD   Physical Exam: Filed Vitals:   04/16/2015 1209 04/27/2015 1230 04/30/2015 1330 05/07/2015 1340  BP:  112/56 106/87 106/87  Pulse:  64 64 64  Temp: 97 F (36.1 C)     TempSrc: Rectal     Resp:  15  16  SpO2:  100% 100% 100%    Wt Readings from Last 3 Encounters:  04/19/15 101.152 kg (223 lb)  04/15/15 102.513 kg (226 lb)  03/25/15 103.511 kg (228 lb 3.2 oz)    General:  Appears quite lethargic obese on BiPAP Eyes: PERRL, normal lids, irises & conjunctiva ENT: grossly normal hearing, lips & tongue Neck: no LAD, masses or thyromegaly Cardiovascular: Irregularly irregular no m/r/g. 3+ lower extremity edema on the left. The mild delay up to abdomen incision on left above-the-knee amputation nursing Telemetry: SR, no arrhythmias  Respiratory: Mild increased work of breathing on BiPAP rest sounds are quite distant faint rhonchi Abdomen: Obese abdomen with edema extending to umbilical Skin: no rash or induration seen on limited exam Musculoskeletal: grossly normal tone BUE/BLE Psychiatric: grossly normal mood and affect, speech fluent and appropriate Neurologic: grossly non-focal.          Labs on Admission:  Basic Metabolic Panel:  Recent Labs Lab 04/18/15  05/03/2015 1023  NA 132* 133*  K 5.6* 5.3*  CL  --  91*  CO2  --  30  GLUCOSE  --  132*  BUN 81* 95*  CREATININE 2.3* 3.02*  CALCIUM  --  8.7*   Liver Function Tests:  Recent Labs Lab 04/18/15 05/02/2015 1023  AST 15 16  ALT 5* 10*  ALKPHOS 90 81  BILITOT  --  1.1  PROT  --  6.7  ALBUMIN  --  3.1*    Recent Labs Lab 04/30/2015 1023  LIPASE 23    Recent Labs Lab 04/22/2015 1027  AMMONIA 86*   CBC:  Recent Labs Lab 04/18/15 04/29/2015 1023  WBC 6.6 6.3  NEUTROABS 5 5.2  HGB 9.0* 8.7*  HCT 29* 27.4*  MCV  --  92.3  PLT 139* 121*   Cardiac Enzymes: No results for input(s): CKTOTAL, CKMB, CKMBINDEX, TROPONINI in the last 168 hours.  BNP (last 3 results)  Recent Labs  07/06/14 1425  BNP 254.3*    ProBNP (last 3 results) No results for input(s): PROBNP in the last 8760 hours.  CBG: No results for input(s): GLUCAP in the last 168 hours.  Radiological Exams on Admission: Dg Chest Portable 1 View  04/13/2015  CLINICAL DATA:  SOB,HX HTN,STROKE,DM,CKD.''''COULD NOT GET PT FLAT FOR X-RAY''' EXAM: PORTABLE CHEST 1 VIEW COMPARISON:  07/07/2014 FINDINGS: Mildly degraded exam due to AP portable technique and patient body habitus. Patient rotated left. Cardiomegaly accentuated by AP portable technique. Probable layering bilateral pleural effusions, similar. No pneumothorax. Interstitial edema is moderate and progressive. Lower lobe predominant airspace opacities are similar on the left and worse on the right. IMPRESSION: Overall worsened aeration with new or increased congestive heart failure and similar layering bilateral pleural effusions. Similar left and worsened right base airspace disease which could represent atelectasis concurrent infection. Electronically Signed   By: Abigail Miyamoto M.D.   On: 04/19/2015 11:14    EKG: Independently reviewed. Atrial fibrillation RBBB and LAFB  Assessment/Plan Principal Problem:   Acute respiratory failure with hypoxia (HCC) Active  Problems:   DM type 2 causing renal disease (HCC)   Anemia,  iron deficiency   Hyperkalemia   Morbid obesity (HCC)   Essential hypertension   Hypothyroidism   Non-healing wound of lower extremity   Acute renal failure superimposed on stage 3 chronic kidney disease (HCC)   Acute on chronic diastolic HF (heart failure) (HCC)   [name]-fib (HCC)   Altered mental status   Acute encephalopathy   Anasarca   Hematuria   UTI (lower urinary tract infection)  #1. Acute respiratory failure with hypoxia and hypercapnia related to acute diastolic heart failure. Asian placed on BiPAP in the emergency department. Venous blood gas pH is 7.22, PCO2 83.4, PO2 45.0 by car 34.4 total CO2 37. Chest x-ray with new or increased congestive heart failure and bilateral pleural effusions. Echo done in April yielded an EF of 55-60% with pulmonary hypertension and mitral regurg. On Demadex at home Lactic acid within the limits of normal initial troponin negative. -Admit to step down -Continue BiPAP -Lasix 60 mg 3 times [name] day -Monitor intake and output -Obtain daily weights -Cycle troponins -Repeat EKG in the [name].m. -Follow renal function -Cardiology consult  #2. Acute encephalopathy likely related to #1 and elevated ammonia -Continue BiPAP -Aggressive diuresis -Lactulose enema -If no improvement consider imaging  #3. acute on chronic renal failure stage III. And on admission 3.0 baseline appears to be 2.0. Home medications include Demadex -Monitor urine output -Monitor creatinine closely given need for Lasix  #4. Hypertension. Stable in the emergency department. Home medications include Apresoline Demadex. Blood pressure 124/55 -Hold home medications for now -Monitor vitals on step down  #5. Anasarca. Likely related to above -See therapy see #1 -Obtain an albumin level -Daily weights  6. Hematuria. Foley inserted at facility and immediately got [name] return of blood-tinged urine. Foley continues to drain bloody  urine -Urinalysis -Monitor output -Consider renal ultrasound  #7. Hyperkalemia. Mild. Likely related to above. -Lasix as noted above -Recheck in the morning  #8. UTI -Rocephin -Monitor urine output -Urine culture    -Cardiology consult   Code Status: full DVT Prophylaxis: Family Communication: none present Disposition Plan: back to facility  Time spent: 65 minutes  [place] [name]

## 2015-04-21 NOTE — H&P (Signed)
PULMONARY / CRITICAL CARE MEDICINE   Name: Sandra [name] MRN: [id number] DOB: [birth year]    ADMISSION DATE:  04/26/2015 CONSULTATION DATE:  2/9  REFERRING MD:  Dr. Waldron Labs TRH  CHIEF COMPLAINT:  AMS  HISTORY OF PRESENT ILLNESS:   82 year old female who resides in [facility] SNF, with PMH as below, which includes Chronic respiratory failure on 3L home O2, CAD, HTN, DM, Colon cancer s/p colectomy, CVA, and R BKA. She presumably has a very limited functional capacity at baseline. Sons have been contacted, but Legrand Como [facility]) has not returned call. She presented to the ED via EMS after having progressive lethargy for one week. She was catheterized in SNF 2 days PTA for 520mL of bloody urine. In the ED she was noted to be somnolent, but arousable with profound anasarca. CXR concerning for pulmonary edema. ABG was obtained and showed acute on chronic hypercarbia. She was started on BiPAP, which initially she responded well to, however, eventually she became very agitated and was attempting to pull off BiPAP and remove IVs. She was administered ativan, and shortly became somnolent again requiring the continuance of BiPAP. PCCM was asked to see for further eval.   PAST MEDICAL HISTORY :  She  has a past medical history of Anemia; CAD (coronary artery disease); Peripheral vascular disease ([place]); Hypertension; Cancer ([place]); Anemia, iron deficiency (01/17/2012); Hemorrhoids; Nosebleed; Stroke [facility]); Cellulitis (03/08/12); Diabetes mellitus; Hypothyroidism; Shortness of breath dyspnea; Depression; Wound infection ([place]); Diabetic foot ulcers ([place]); RBBB (right bundle branch block with left anterior fascicular block); Chronic kidney disease; and Edema of both legs.  PAST SURGICAL HISTORY: She  has past surgical history that includes Appendectomy; Cholecystectomy; Tonsilectomy, adenoidectomy, bilateral myringotomy and tubes; Colon surgery; Lower extremity angiogram (September 01, 2012); Aortogram (September 01, 2012);  abdominal aortagram (N/A, 09/01/2012); lower extremity angiogram (Bilateral, 09/01/2012); and Amputation (Right, 07/06/2014).  Allergies  Allergen Reactions  . Blackberry [Rubus Fruticosus]     Can't take because of Cancer  . Meloxicam Shortness Of Breath  . Procaine Hcl Swelling  . Alendronate Sodium Other (See Comments)    constipation  . Meloxicam     No current facility-administered medications on file prior to encounter.   Current Outpatient Prescriptions on File Prior to Encounter  Medication Sig  . aspirin EC 81 MG tablet Take 81 mg by mouth every evening.  . Cholecalciferol (VITAMIN D-3 PO) Take 5,000 Units by mouth daily. Take 5 tablets = 5000 MG  . diclofenac sodium (VOLTAREN) 1 % GEL Apply 2 g topically 2 (two) times daily. Apply to left shoulder topically BID  . FLUoxetine (PROZAC) 20 MG tablet Take 20 mg by mouth daily.  . hydrALAZINE (APRESOLINE) 25 MG tablet Take 25 mg by mouth 2 (two) times daily. Hold for SBP <110 or HR <60  . Insulin Glargine (LANTUS SOLOSTAR) 100 UNIT/ML Solostar Pen Inject 25 Units into the skin daily at 10 pm.  . levothyroxine (SYNTHROID, LEVOTHROID) 125 MCG tablet Take 125 mcg by mouth. Synthroid ONlY  . oxyCODONE (ROXICODONE) 5 MG immediate release tablet Take one tablet by mouth twice daily for pain (Patient taking differently: Take 5 mg by mouth 2 (two) times daily as needed for severe pain. Take one tablet by mouth twice daily for pain)  . polyethylene glycol (MIRALAX / GLYCOLAX) packet Take 17 g by mouth 2 (two) times daily.  . Protein (PROCEL PO) Take 1 scoop by mouth 2 (two) times daily.  Marland Kitchen SALINE NASAL SPRAY NA Place 2 sprays into  the nose 2 (two) times daily.  . sennosides-docusate sodium (SENOKOT-S) 8.6-50 MG tablet Take 1 tablet by mouth 2 (two) times daily.  Marland Kitchen torsemide (DEMADEX) 20 MG tablet Take 20 mg by mouth daily.   . BD PEN NEEDLE NANO U/F 32G X 4 MM MISC Reported on 04/19/2015  . Emollient (HYDROPHOR) OINT Apply topically daily. To  LLE  . meclizine (ANTIVERT) 12.5 MG tablet Take 12.5 mg by mouth every 8 (eight) hours as needed for dizziness.  . nystatin (MYCOSTATIN) powder Apply topically 2 (two) times daily.   Marland Kitchen oxycodone (OXY-IR) 5 MG capsule Take 5 mg by mouth every 6 (six) hours as needed.  . OXYGEN Inhale 2 L into the lungs. To keep sats at 88 or above  . PRODIGY NO CODING BLOOD GLUC test strip Reported on 04/19/2015  . sodium polystyrene (KAYEXALATE) 15 GM/60ML suspension Take 15 g by mouth once.    FAMILY HISTORY:  Her indicated that her mother is deceased. She indicated that her father is deceased.   SOCIAL HISTORY: She  reports that she quit smoking about 48 years ago. Her smoking use included Cigarettes. She started smoking about 60 years ago. She has a 13 pack-year smoking history. She has never used smokeless tobacco. She reports that she does not drink alcohol or use illicit drugs.  REVIEW OF SYSTEMS:   Unable due to altered mental status  SUBJECTIVE:    VITAL SIGNS: BP 114/51 mmHg  Pulse 65  Temp(Src) 97 F (36.1 C) (Rectal)  Resp 18  SpO2 100%  HEMODYNAMICS:    VENTILATOR SETTINGS: Vent Mode:  [-]  FiO2 (%):  [40 %] 40 %  INTAKE / OUTPUT:    PHYSICAL EXAMINATION: General:  Morbidly obese, chronically ill appearing female Neuro:  Obtunded, minimally arouses to painful stimuli, does not open eyes to pain.  HEENT:  [place]/AT, pupils pinpoint, JVD difficult do discern due to neck girth.  Cardiovascular:  RRR, no MRG Lungs:  Distant, diminished breath sounds.  Abdomen:  Obese, tight skin but not firm, non-distended Musculoskeletal:  R AKA with fluid filled blister on incision line Skin:  LLE redness and wounds.          LABS:  BMET  Recent Labs Lab 04/18/15 04/30/2015 1023  NA 132* 133*  K 5.6* 5.3*  CL  --  91*  CO2  --  30  BUN 81* 95*  CREATININE 2.3* 3.02*  GLUCOSE  --  132*    Electrolytes  Recent Labs Lab 05/07/2015 1023  CALCIUM 8.7*    CBC  Recent  Labs Lab 04/18/15 05/06/2015 1023  WBC 6.6 6.3  HGB 9.0* 8.7*  HCT 29* 27.4*  PLT 139* 121*    Coag's  Recent Labs Lab 05/07/2015 1023  INR 1.22    Sepsis Markers  Recent Labs Lab 05/06/2015 1039  LATICACIDVEN 0.77    ABG  Recent Labs Lab 04/22/2015 1332  PHART 7.215*  PCO2ART 83.7*  PO2ART 96.0    Liver Enzymes  Recent Labs Lab 04/18/15 04/17/2015 1023  AST 15 16  ALT 5* 10*  ALKPHOS 90 81  BILITOT  --  1.1  ALBUMIN  --  3.1*    Cardiac Enzymes No results for input(s): TROPONINI, PROBNP in the last 168 hours.  Glucose No results for input(s): GLUCAP in the last 168 hours.  Imaging Dg Chest Portable 1 View  04/22/2015  CLINICAL DATA:  SOB,HX HTN,STROKE,DM,CKD.''''COULD NOT GET PT FLAT FOR X-RAY''' EXAM: PORTABLE CHEST 1 VIEW COMPARISON:  07/07/2014 FINDINGS: Mildly degraded exam due to AP portable technique and patient body habitus. Patient rotated left. Cardiomegaly accentuated by AP portable technique. Probable layering bilateral pleural effusions, similar. No pneumothorax. Interstitial edema is moderate and progressive. Lower lobe predominant airspace opacities are similar on the left and worse on the right. IMPRESSION: Overall worsened aeration with new or increased congestive heart failure and similar layering bilateral pleural effusions. Similar left and worsened right base airspace disease which could represent atelectasis concurrent infection. Electronically Signed   By: Abigail Miyamoto M.D.   On: 04/22/2015 11:14    STUDIES:    CULTURES: Blood 2/9 >>> Urine 2/9 >>>  ANTIBIOTICS: Zosyn 2/9 >>> Vancomycin 2/9 >>>  SIGNIFICANT EVENTS: 2/9 admit  LINES/TUBES:   DISCUSSION: 82 year old female with multiple medical co-morbidities presented to [facility] ED 2/9 with 1 week of progressive dyspnea.  ASSESSMENT / PLAN:  PULMONARY A:  Acute on chronic  Hypoxic, hypercarbic respiratory failure r/t  ? Pulmonary edema  P:   Initiated BiPAP. Low threshold  for intubation Trend ABG CXR repeat Lasix given in ED Transfer to ICU AS THE PATIENT requires close observation at this time.  CARDIOVASCULAR A:  CAD, HTN P:  Continue home meds once the n/g tube in place Hold torsemide, ASA, hydralazine,   RENAL A:   AKI potentially due to infection  Hyperkalemia  P:   Trend BUN and creatinine Correct K as indicated  GASTROINTESTINAL A:   H/o Colon cancer s/p colectomy P:   Hold feedings at this time. NPO for now Protonix for SUP  HEMATOLOGIC A:   Anaemia of chronic disease, hemorrhoids   P:  Trend CBC Transfuse per ICU guidelines Heparin for VTE prophylaxis  INFECTIOUS A:   Urinary tract infection  Open, unhealing wounds on left leg  P:   ABX as above Trend PCT Trend WBC and fever curve  ENDOCRINE A:   DM,  Hypothyroidism P:   POC testing Q4 hours SSI coverage Synthroid   NEUROLOGIC A:   Acute encephlopathy r/t ? Metabolic encephalopathy r/ t hypercarbia,? Infection,? Hepatic encephalopathy, Depression H/o CVA  P:   Lactulose 351mL rectal daily and repeat ammonia level RASS goal: 0 Hold Prozac Hold Oxycodone and other analgesics ointment at this time     FAMILY  - Updates: Son Legrand Como POA spoken to via telephone by [facility]. Wishes for short term intubation. Cautioned that the would likely be difficult to wean from vent. He says that if there is a chance she could return to SNF she would want that. In that same vein, she would not wasn't CPR, defib in the event of cardiac arrest.   - Inter-disciplinary family meet or Palliative Care meeting due by:  2/16   Georgann Housekeeper, AGACNP-BC [facility] Pager [phone] or [phone]  04/22/2015 3:58 PM

## 2015-04-21 NOTE — ED Notes (Signed)
From [place] via [place] for 1 week of weakness and increasing lethergy, is grossly edematous, ALOC, on 3L O2, CBG 136, VSS,

## 2015-04-21 NOTE — ED Notes (Signed)
Respiratory not available to assist RN with pt upstairs.

## 2015-04-21 NOTE — ED Notes (Signed)
Vital signs stable. 

## 2015-04-21 NOTE — Consult Note (Addendum)
Pharmacy Antibiotic Note  Sandra [name] is a 82 y.o. female admitted on 04/14/2015 with UTI.  Pharmacy has been consulted for ceftriaxone dosing.  Pt has foley in place. UA w/ turbid appearance, nitrite positive, moderate leukocytes, many bacteria, too numerous to count WBC and w/ some squamous cells present.   Plan: Ceftriaxone 1g IV q24h x 7 days recommended Pharmacy will sign off as no renal adjustments are necessary.    Temp (24hrs), Avg:97.4 F (36.3 C), Min:97 F (36.1 C), Max:97.7 F (36.5 C)   Recent Labs Lab 04/18/15 04/17/2015 1023 04/19/2015 1039  WBC 6.6 6.3  --   CREATININE 2.3* 3.02*  --   LATICACIDVEN  --   --  0.77    Estimated Creatinine Clearance: 15.7 mL/min (by C-G formula based on Cr of 3.02).    Antimicrobials this admission: Ceftriaxone 2/9 >>   Dose adjustments this admission: n/a  Microbiology results: 2/9 UCx: in process    Thank you for allowing pharmacy to be a part of this patient's care.  Joya San, PharmD Clinical Pharmacy Resident Pager # [phone] 05/03/2015 2:10 PM  Addendum  Ceftriaxone discontinued, never given. Vanc and zosyn ordered for multiple possible sources of infection (UTI, wounds). Lactic acid and WBC wnl. Pt is afebrile.  SCr elevated to 3.02, baseline ~1.7-2. CrCl ~16 ml/min.  Plan: Vanc 1500 mg IV q48h Zosyn 3.375g IV q8h Monitor renal function closely for dose adjustments F/u cultures, LOT, VT prn, clinical progression  Joya San, PharmD Clinical Pharmacy Resident Pager # [phone] 05/08/2015 4:38 PM  Addendum > Update for renal function, CrCl < 20, start Zosyn 2.25g IV q 6 hrs.  Sandra [name], BCPS  Clinical Pharmacist Pager [phone]  04/20/2015 10:45 PM

## 2015-04-21 NOTE — Progress Notes (Signed)
[facility] Progress Note Patient Name: RAELEI BOUSMAN DOB: 1930/04/17 MRN: [id number]   Date of Service  04/29/2015  HPI/Events of Note  RN notified that EJ IV is not functioning properly & positional.   eICU Interventions  Team to place CVL if IV team unable to establish alternative access before.      Intervention Category Intermediate Interventions: Other:  Tera Partridge 05/08/2015, 11:35 PM

## 2015-04-21 NOTE — ED Notes (Signed)
Patient undressed, in gown, on monitor, continuous pulse oximetry, blood pressure cuff and oxygen [place] (3L) 

## 2015-04-21 NOTE — Progress Notes (Signed)
Patient arrived on unit.  Transferred to ICU bed.  Linen changed and patient wiped with CHG wipes.  No immediate distress, VSS.  Pt. Currently on BiPAP.

## 2015-04-21 NOTE — Progress Notes (Signed)
Pt placed in Contact Isolation for MRSA.

## 2015-04-21 NOTE — ED Notes (Signed)
Pt more awake, removed Bi-pap, crying out, pulling at lines, VSS, EDP updated

## 2015-04-21 NOTE — Progress Notes (Signed)
Contacted pt's son Lewis Shock to give update on his mother's health status, informed of intubation, and informed of need for CVL, and request permission for placement.  Pt's son agrees to CVL placement.

## 2015-04-22 ENCOUNTER — Other Ambulatory Visit (HOSPITAL_COMMUNITY): Payer: Medicare Other

## 2015-04-22 ENCOUNTER — Inpatient Hospital Stay (HOSPITAL_COMMUNITY): Payer: Medicare Other

## 2015-04-22 DIAGNOSIS — R0600 Dyspnea, unspecified: Secondary | ICD-10-CM

## 2015-04-22 LAB — TROPONIN I
TROPONIN I: 0.08 ng/mL — AB (ref <0.031)
TROPONIN I: 0.08 ng/mL — AB (ref <0.031)
TROPONIN I: 0.08 ng/mL — AB (ref <0.031)

## 2015-04-22 LAB — BASIC METABOLIC PANEL
ANION GAP: 11 (ref 5–15)
BUN: 95 mg/dL — AB (ref 6–20)
CALCIUM: 8.4 mg/dL — AB (ref 8.9–10.3)
CO2: 30 mmol/L (ref 22–32)
Chloride: 93 mmol/L — ABNORMAL LOW (ref 101–111)
Creatinine, Ser: 2.88 mg/dL — ABNORMAL HIGH (ref 0.44–1.00)
GFR calc Af Amer: 16 mL/min — ABNORMAL LOW (ref >60)
GFR, EST NON AFRICAN AMERICAN: 14 mL/min — AB (ref >60)
GLUCOSE: 63 mg/dL — AB (ref 65–99)
Potassium: 3.8 mmol/L (ref 3.5–5.1)
Sodium: 134 mmol/L — ABNORMAL LOW (ref 135–145)

## 2015-04-22 LAB — GLUCOSE, CAPILLARY
GLUCOSE-CAPILLARY: 52 mg/dL — AB (ref 65–99)
GLUCOSE-CAPILLARY: 101 mg/dL — AB (ref 65–99)
GLUCOSE-CAPILLARY: 78 mg/dL (ref 65–99)
GLUCOSE-CAPILLARY: 84 mg/dL (ref 65–99)
GLUCOSE-CAPILLARY: 79 mg/dL (ref 65–99)
Glucose-Capillary: 73 mg/dL (ref 65–99)

## 2015-04-22 LAB — HEMOGLOBIN A1C
HEMOGLOBIN A1C: 5.6 % (ref 4.8–5.6)
Mean Plasma Glucose: 114 mg/dL

## 2015-04-22 LAB — PROCALCITONIN: Procalcitonin: 0.41 ng/mL

## 2015-04-22 LAB — AMMONIA: Ammonia: 38 umol/L — ABNORMAL HIGH (ref 9–35)

## 2015-04-22 LAB — LACTIC ACID, PLASMA: LACTIC ACID, VENOUS: 1.1 mmol/L (ref 0.5–2.0)

## 2015-04-22 MED ORDER — FUROSEMIDE 10 MG/ML IJ SOLN
40.0000 mg | Freq: Once | INTRAMUSCULAR | Status: AC
  Administered 2015-04-22: 40 mg via INTRAVENOUS
  Filled 2015-04-22: qty 4

## 2015-04-22 MED ORDER — ANTISEPTIC ORAL RINSE SOLUTION (CORINZ)
7.0000 mL | Freq: Four times a day (QID) | OROMUCOSAL | Status: DC
  Administered 2015-04-22 – 2015-04-23 (×5): 7 mL via OROMUCOSAL

## 2015-04-22 MED ORDER — CHLORHEXIDINE GLUCONATE 0.12% ORAL RINSE (MEDLINE KIT)
15.0000 mL | Freq: Two times a day (BID) | OROMUCOSAL | Status: DC
  Administered 2015-04-22 – 2015-04-23 (×3): 15 mL via OROMUCOSAL

## 2015-04-22 MED ORDER — LACTULOSE 10 GM/15ML PO SOLN
30.0000 g | Freq: Every day | ORAL | Status: DC
  Administered 2015-04-22 – 2015-04-23 (×2): 30 g
  Filled 2015-04-22 (×2): qty 45

## 2015-04-22 MED ORDER — DEXTROSE 50 % IV SOLN
INTRAVENOUS | Status: AC
Start: 2015-04-22 — End: 2015-04-22
  Administered 2015-04-22: 50 mL
  Filled 2015-04-22: qty 50

## 2015-04-22 MED ORDER — PRO-STAT SUGAR FREE PO LIQD
30.0000 mL | Freq: Two times a day (BID) | ORAL | Status: DC
  Administered 2015-04-22 – 2015-04-23 (×3): 30 mL
  Filled 2015-04-22 (×4): qty 30

## 2015-04-22 MED ORDER — VITAL AF 1.2 CAL PO LIQD
1000.0000 mL | ORAL | Status: DC
  Administered 2015-04-22: 1000 mL

## 2015-04-22 NOTE — Progress Notes (Signed)
Initial Nutrition Assessment  DOCUMENTATION CODES:   Morbid obesity  INTERVENTION:    Initiate TF via OGT with Vital AF 1.2 at 25 ml/h and Prostat 30 ml BID on day 1; on day 2, increase to goal rate of 40 ml/h (960 ml per day) to provide 1352 kcals, 102 gm protein, 779 ml free water daily.  NUTRITION DIAGNOSIS:   Inadequate oral intake related to inability to eat as evidenced by NPO status.  GOAL:   Provide needs based on ASPEN/SCCM guidelines  MONITOR:   Vent status, Labs, Weight trends, TF tolerance, I & O's  REASON FOR ASSESSMENT:   Consult Enteral/tube feeding initiation and management  ASSESSMENT:   82 year old female who resides in [facility], with history of chronic respiratory failure on 3L home O2, CAD, HTN, DM, colon cancer s/p colectomy, CVA, and R BKA. She presumably has a very limited functional capacity at baseline. She presented to the ED via EMS after having progressive lethargy for one week.  In the ED she was noted to be somnolent, but arousable with profound anasarca.   Discussed patient in ICU rounds and with RN today. Per discussion with Dr. Vaughan Browner, okay for RD to order TF. Nutrition-Focused physical exam completed. Findings are no fat depletion, mild muscle depletion, and moderate edema.   Patient is currently intubated on ventilator support MV: 9.8 L/min Temp (24hrs), Avg:97.2 F (36.2 C), Min:95.8 F (35.4 C), Max:98 F (36.7 C)  Propofol: none   Diet Order:  Diet NPO time specified  Skin:  Wound (see comment) (venous stasis ulcers to LLE)  Last BM:  unknown  Height:   Ht Readings from Last 1 Encounters:  04/19/15 5\' 1"  (1.549 m)    Weight:   Wt Readings from Last 1 Encounters:  04/22/15 227 lb 8.2 oz (103.2 kg)    Ideal Body Weight:  43.9 kg  BMI:  46.8 (adjusted for AKA)  Estimated Nutritional Needs:   Kcal:  [id number]  Protein:  100-110 gm  Fluid:  1.8 L  EDUCATION NEEDS:   No education needs identified at  this time  Molli Barrows, [place], [place], [place] Pager [phone] After Hours Pager [phone]

## 2015-04-22 NOTE — Progress Notes (Signed)
Contacted Dr. Ashok Cordia concerning pt's bradycardia, RBB on EKG this am.  No new orders at this time.

## 2015-04-22 NOTE — Progress Notes (Signed)
Pt transported to CT on vent, no complications  

## 2015-04-22 NOTE — Progress Notes (Signed)
    Subjective:  Intubated sedated  Objective:  Vital Signs in the last 24 hours: Temp:  [95.8 F (35.4 C)-98 F (36.7 C)] 97.4 F (36.3 C) (02/10 0813) Pulse Rate:  [40-64] 51 (02/10 1400) Resp:  [14-24] 20 (02/10 1400) BP: (87-156)/(32-114) 140/82 mmHg (02/10 1400) SpO2:  [99 %-100 %] 100 % (02/10 1400) FiO2 (%):  [30 %-40 %] 40 % (02/10 1134) Weight:  [103.2 kg (227 lb 8.2 oz)] 103.2 kg (227 lb 8.2 oz) (02/10 0500)  Intake/Output from previous day: 02/09 0701 - 02/10 0700 In: 1828.8 [I.V.:1198.8; IV Piggyback:600] Out: 500 [Urine:100; Emesis/NG output:400]  Physical Exam: Obese woman, intubated, sedated on vent in NAD HEENT: normal Neck: JVP - normal Lungs: CTA bilaterally CV: RRR without murmur or gallop Abd: soft, obese Ext: diffuse edema, right AKA  Lab Results:  Recent Labs  04/18/2015 1023  WBC 6.3  HGB 8.7*  PLT 121*    Recent Labs  05/10/2015 1912 04/22/15 0450  NA 132* 134*  K 4.9 3.8  CL 91* 93*  CO2 27 30  GLUCOSE 87 63*  BUN 96* 95*  CREATININE 2.83* 2.88*    Recent Labs  04/22/15 0450 04/22/15 1230  TROPONINI 0.08* 0.08*    Cardiac Studies: 2D Echo pending  Tele: Sinus rhythm, sinus brady  Assessment/Plan:  1. Acute respiratory failure - VDRF 2. Acute diastolic CHF 3. AKI on CKD III 4. Severe pulmonary HTN  Await 2D Echo, continue diuresis as tolerated. Not much to offer with regard to further recommendations. Will review echo when available to see if medical therapies can be adjusted. Overall poor prognosis considering severe baseline disability.    Sherren Mocha, M.D. 04/22/2015, 4:48 PM

## 2015-04-22 NOTE — Progress Notes (Signed)
Hypoglycemic Event  CBG: 52  Treatment: D50 IV 50 mL  Symptoms: Sweaty  Follow-up CBG: [id number] CBG Result:101  Possible Reasons for Event: Inadequate meal intake  Comments/MD notified:    Beabrout,Tatiyanna Lashley L

## 2015-04-22 NOTE — Procedures (Signed)
Central Venous Catheter Insertion Procedure Note Sandra [name] [id number] 04-Sep-1930  Procedure: Insertion of Central Venous Catheter Indications: Assessment of intravascular volume, Drug and/or fluid administration and Frequent blood sampling  Procedure Details Consent: Unable to obtain consent because of altered level of consciousness. Time Out: Verified patient identification, verified procedure, site/side was marked, verified correct patient position, special equipment/implants available, medications/allergies/relevent history reviewed, required imaging and test results available.  Performed  Maximum sterile technique was used including antiseptics, cap, gloves, gown, hand hygiene, mask and sheet. Skin prep: Chlorhexidine; local anesthetic administered A antimicrobial bonded/coated triple lumen catheter was placed in the left internal jugular vein using the Seldinger technique.  Evaluation Blood flow good Complications: No apparent complications Patient did tolerate procedure well. Chest X-ray ordered to verify placement.  CXR: pending.  Procedure performed under direct ultrasound guidance for real time vessel cannulation.      [name], [place] [facility] Pager: [phone]  or [phone] 04/22/2015, 1:01 AM   fullly supervised Required  [name]. [name], MD, [place] Pgr: [name] [facility]

## 2015-04-22 NOTE — Progress Notes (Signed)
Zosyn infusion started as CVL placement confirmed.

## 2015-04-22 NOTE — Progress Notes (Signed)
PULMONARY / CRITICAL CARE MEDICINE   Name: Sandra [name] MRN: [id number] DOB: 1930-06-29    ADMISSION DATE:  04/27/2015 CONSULTATION DATE:  2/9  REFERRING MD:  Dr. Waldron Labs TRH  CHIEF COMPLAINT:  AMS  HISTORY OF PRESENT ILLNESS:   82 year old female who resides in [facility], with PMH as below, which includes Chronic respiratory failure on 3L home O2, CAD, HTN, DM, Colon cancer s/p colectomy, CVA, and R BKA. She presumably has a very limited functional capacity at baseline. Sons have been contacted, but [facility]) has not returned call. She presented to the ED via EMS after having progressive lethargy for one week. She was catheterized in SNF 2 days PTA for 524mL of bloody urine. In the ED she was noted to be somnolent, but arousable with profound anasarca. CXR concerning for pulmonary edema. ABG was obtained and showed acute on chronic hypercarbia. She was started on BiPAP, which initially she responded well to, however, eventually she became very agitated and was attempting to pull off BiPAP and remove IVs. She was administered ativan, and shortly became somnolent again requiring the continuance of BiPAP. PCCM was asked to see for further eval.   PAST MEDICAL HISTORY :  She  has a past medical history of Anemia; CAD (coronary artery disease); Peripheral vascular disease ([place]); Hypertension; Cancer ([place]); Anemia, iron deficiency (01/17/2012); Hemorrhoids; Nosebleed; Stroke [facility]); Cellulitis (03/08/12); Diabetes mellitus; Hypothyroidism; Shortness of breath dyspnea; Depression; Wound infection ([place]); Diabetic foot ulcers ([place]); RBBB (right bundle branch block with left anterior fascicular block); Chronic kidney disease; and Edema of both legs.  PAST SURGICAL HISTORY: She  has past surgical history that includes Appendectomy; Cholecystectomy; Tonsilectomy, adenoidectomy, bilateral myringotomy and tubes; Colon surgery; Lower extremity angiogram (September 01, 2012); Aortogram (September 01, 2012);  abdominal aortagram (N/A, 09/01/2012); lower extremity angiogram (Bilateral, 09/01/2012); and Amputation (Right, 07/06/2014).  Allergies  Allergen Reactions  . Blackberry [Rubus Fruticosus]     Can't take because of Cancer  . Meloxicam Shortness Of Breath  . Procaine Hcl Swelling  . Alendronate Sodium Other (See Comments)    constipation  . Meloxicam     No current facility-administered medications on file prior to encounter.   Current Outpatient Prescriptions on File Prior to Encounter  Medication Sig  . aspirin EC 81 MG tablet Take 81 mg by mouth every evening.  . Cholecalciferol (VITAMIN D-3 PO) Take 5,000 Units by mouth daily. Take 5 tablets = 5000 MG  . diclofenac sodium (VOLTAREN) 1 % GEL Apply 2 g topically 2 (two) times daily. Apply to left shoulder topically BID  . FLUoxetine (PROZAC) 20 MG tablet Take 20 mg by mouth daily.  . hydrALAZINE (APRESOLINE) 25 MG tablet Take 25 mg by mouth 2 (two) times daily. Hold for SBP <110 or HR <60  . Insulin Glargine (LANTUS SOLOSTAR) 100 UNIT/ML Solostar Pen Inject 25 Units into the skin daily at 10 pm.  . levothyroxine (SYNTHROID, LEVOTHROID) 125 MCG tablet Take 125 mcg by mouth. Synthroid ONlY  . oxyCODONE (ROXICODONE) 5 MG immediate release tablet Take one tablet by mouth twice daily for pain (Patient taking differently: Take 5 mg by mouth 2 (two) times daily as needed for severe pain. Take one tablet by mouth twice daily for pain)  . polyethylene glycol (MIRALAX / GLYCOLAX) packet Take 17 g by mouth 2 (two) times daily.  . Protein (PROCEL PO) Take 1 scoop by mouth 2 (two) times daily.  Marland Kitchen SALINE NASAL SPRAY NA Place 2 sprays into  the nose 2 (two) times daily.  . sennosides-docusate sodium (SENOKOT-S) 8.6-50 MG tablet Take 1 tablet by mouth 2 (two) times daily.  Marland Kitchen torsemide (DEMADEX) 20 MG tablet Take 20 mg by mouth daily.   . BD PEN NEEDLE NANO U/F 32G X 4 MM MISC Reported on 04/19/2015  . Emollient (HYDROPHOR) OINT Apply topically daily. To  LLE  . meclizine (ANTIVERT) 12.5 MG tablet Take 12.5 mg by mouth every 8 (eight) hours as needed for dizziness.  . nystatin (MYCOSTATIN) powder Apply topically 2 (two) times daily.   Marland Kitchen oxycodone (OXY-IR) 5 MG capsule Take 5 mg by mouth every 6 (six) hours as needed.  . OXYGEN Inhale 2 L into the lungs. To keep sats at 88 or above  . PRODIGY NO CODING BLOOD GLUC test strip Reported on 04/19/2015  . sodium polystyrene (KAYEXALATE) 15 GM/60ML suspension Take 15 g by mouth once.    FAMILY HISTORY:  Her indicated that her mother is deceased. She indicated that her father is deceased.   SOCIAL HISTORY: She  reports that she quit smoking about 48 years ago. Her smoking use included Cigarettes. She started smoking about 60 years ago. She has a 13 pack-year smoking history. She has never used smokeless tobacco. She reports that she does not drink alcohol or use illicit drugs.  REVIEW OF SYSTEMS:   Unable due to altered mental status  SUBJECTIVE:    VITAL SIGNS: BP 114/39 mmHg  Pulse 49  Temp(Src) 97.4 F (36.3 C) (Oral)  Resp 22  Wt 227 lb 8.2 oz (103.2 kg)  SpO2 100%  HEMODYNAMICS:    VENTILATOR SETTINGS: Vent Mode:  [-] PRVC FiO2 (%):  [30 %-40 %] 40 % Set Rate:  [22 bmp] 22 bmp Vt Set:  [430 mL] 430 mL PEEP:  [5 cmH20] 5 cmH20 Plateau Pressure:  [24 cmH20-27 cmH20] 27 cmH20  INTAKE / OUTPUT: I/O last 3 completed shifts: In: 1828.8 [I.V.:1198.8; Other:30; IV [id number] Out: 500 [Urine:100; Emesis/NG output:400]  PHYSICAL EXAMINATION: General:  Morbidly obese, chronically ill appearing female Neuro:  Obtunded, minimally arouses to painful stimuli, does not open eyes to pain.  HEENT:  [place]/AT, pupils pinpoint, JVD difficult do discern due to neck girth.  Cardiovascular:  RRR, no MRG Lungs:  Distant, diminished breath sounds.  Abdomen:  Obese, tight skin but not firm, non-distended Musculoskeletal:  R AKA with fluid filled blister on incision line Skin:  LLE redness and  wounds.          LABS:  BMET  Recent Labs Lab 04/27/2015 1023 04/14/2015 1912 04/22/15 0450  NA 133* 132* 134*  K 5.3* 4.9 3.8  CL 91* 91* 93*  CO2 30 27 30   BUN 95* 96* 95*  CREATININE 3.02* 2.83* 2.88*  GLUCOSE 132* 87 63*    Electrolytes  Recent Labs Lab 04/14/2015 1023 04/28/2015 1912 04/22/15 0450  CALCIUM 8.7* 8.6* 8.4*    CBC  Recent Labs Lab 04/18/15 04/28/2015 1023  WBC 6.6 6.3  HGB 9.0* 8.7*  HCT 29* 27.4*  PLT 139* 121*    Coag's  Recent Labs Lab 04/18/2015 1023  INR 1.22    Sepsis Markers  Recent Labs Lab 05/07/2015 1039 04/20/2015 1911 04/22/15 0143 04/22/15 0450  LATICACIDVEN 0.77  --  1.1  --   PROCALCITON  --  0.34  --  0.41    ABG  Recent Labs Lab 04/16/2015 1332 04/20/2015 1656 04/22/2015 2253  PHART 7.215* 7.287* 7.497*  PCO2ART 83.7* 72.9* 39.9  PO2ART 96.0  105.0* 98.0    Liver Enzymes  Recent Labs Lab 04/18/15 04/26/2015 1023 04/27/2015 1912  AST 15 16  --   ALT 5* 10*  --   ALKPHOS 90 81  --   BILITOT  --  1.1  --   ALBUMIN  --  3.1* 2.8*    Cardiac Enzymes  Recent Labs Lab 05/09/2015 1911 04/22/15 0450  TROPONINI 0.10* 0.08*    Glucose  Recent Labs Lab 05/10/2015 1833 05/01/2015 2351  GLUCAP 85 73    Imaging Dg Chest Port 1 View  04/22/2015  CLINICAL DATA:  Central line placement.  Initial encounter. EXAM: PORTABLE CHEST 1 VIEW COMPARISON:  Chest radiograph performed 05/03/2015 FINDINGS: The patient's endotracheal tube is seen ending 2 cm above the carina. A left IJ line appears to extend across the midline overlying the right brachiocephalic vein, ending near its junction with the SVC. Small bilateral pleural effusions are again noted. Bilateral central and bibasilar airspace opacities raise concern for pulmonary edema. No pneumothorax is seen. The cardiomediastinal silhouette is enlarged. No acute osseous abnormalities are identified. IMPRESSION: 1. Left IJ line appears to extend across the midline overlying the  right brachiocephalic vein, ending near its junction with the SVC. 2. Endotracheal tube ends 2 cm above the carina. 3. Small bilateral pleural effusions noted. Bilateral central and bibasilar airspace opacities raise concern for pulmonary edema. Cardiomegaly. Electronically Signed   By: Garald Balding M.D.   On: 04/22/2015 01:06   Dg Chest Port 1 View  05/02/2015  CLINICAL DATA:  Endotracheal tube placement.  Initial encounter. EXAM: PORTABLE CHEST 1 VIEW COMPARISON:  Chest radiograph performed earlier today at 10:50 a.m. FINDINGS: The patient's endotracheal tube ends just below the carina, directed towards the right mainstem bronchus. This should be retracted 4 cm. Small bilateral pleural effusions are noted. Vascular congestion is seen. Bibasilar airspace opacities raise concern for mild pulmonary edema. No pneumothorax is seen. The cardiomediastinal silhouette is borderline enlarged. No acute osseous abnormalities are seen. An enteric tube is noted extending below the diaphragm. IMPRESSION: 1. Endotracheal tube ends just below the carina, directed toward the right mainstem bronchus. This should be retracted 4 cm. 2. Small bilateral pleural effusions noted. Vascular congestion and borderline cardiomegaly. Bibasilar airspace opacities raise concern for mild pulmonary edema. These results were called by telephone at the time of interpretation on 04/29/2015 at 11:56 pm to [facility] on [facility], who verbally acknowledged these results. Electronically Signed   By: Garald Balding M.D.   On: 05/10/2015 23:57   Dg Chest Portable 1 View  05/06/2015  CLINICAL DATA:  SOB,HX HTN,STROKE,DM,CKD.''''COULD NOT GET PT FLAT FOR X-RAY''' EXAM: PORTABLE CHEST 1 VIEW COMPARISON:  07/07/2014 FINDINGS: Mildly degraded exam due to AP portable technique and patient body habitus. Patient rotated left. Cardiomegaly accentuated by AP portable technique. Probable layering bilateral pleural effusions, similar. No pneumothorax. Interstitial  edema is moderate and progressive. Lower lobe predominant airspace opacities are similar on the left and worse on the right. IMPRESSION: Overall worsened aeration with new or increased congestive heart failure and similar layering bilateral pleural effusions. Similar left and worsened right base airspace disease which could represent atelectasis concurrent infection. Electronically Signed   By: Abigail Miyamoto M.D.   On: 05/07/2015 11:14    STUDIES:    CULTURES: Blood 2/10 > unable to draw.  Urine 2/9 >>> pending  ANTIBIOTICS: Zosyn 2/9 >>> Vancomycin 2/9 >>>  SIGNIFICANT EVENTS: 2/9 admit  LINES/TUBES: ETT 2/9 >> CVL 2/9 >>  DISCUSSION:  82 year old female with multiple medical co-morbidities presented to [facility] ED 2/9 with 1 week of progressive dyspnea.  ASSESSMENT / PLAN:  PULMONARY A:  Acute on chronic  Hypoxic, hypercarbic respiratory failure r/t  ? Pulmonary edema  P:   Ventilated overnight Full vent support VAP prevention Trend ABG CXR daily.  Lasix given in ED Pulmonary Edema goal of negative fluid balance. No clear etiology of chronic respiratory failure.    CARDIOVASCULAR A:  CAD, HTN P:  Continue home meds once the n/g tube in place Lasix as needed for volume removal, ASA, hydralazine,   RENAL A:   AKI potentially due to infection  Hyperkalemia - resolved Azotemia / Uremia P:   Trend BUN and creatinine Correct K as indicated Volume removal with diuretics as able. Careful with AKI.  Renal ultrasound to look for abscess.    GASTROINTESTINAL A:   H/o Colon cancer s/p colectomy P:   Hold feedings at this time. NPO for now Protonix for SUP  HEMATOLOGIC A:   Anaemia of chronic disease, hemorrhoids   P:  Trend CBC Transfuse per ICU guidelines Heparin for VTE prophylaxis  INFECTIOUS A:   Urinary tract infection  Open, unhealing wounds on left leg  P:   ABX as above Trend PCT 0.41 Trend WBC and fever curve Lactate 1.1 Following urine  Cx Unable to get Blood Cx in the ED, or on the Floor due to inability to draw. Pt. On antibiotics empirically.   ENDOCRINE A:   DM,  Hypothyroidism P:   POC testing Q4 hours SSI coverage Synthroid  NEUROLOGIC A:   Acute encephlopathy r/t ? Metabolic encephalopathy r/ t hypercarbia,? Infection,? Hepatic encephalopathy, Depression Uremic Encephalopathy.  H/o CVA  P:   Lactulose per tube.   RASS goal: 0 Hold Prozac Hold Oxycodone and other analgesics at this time   CT head.    FAMILY  - Updates: Son Sherrye Payor spoken to via telephone by [facility]. Wishes for short term intubation. Cautioned that the would likely be difficult to wean from vent. He says that if there is a chance she could return to SNF she would want that. In that same vein, she would not wasn't CPR, defib in the event of cardiac arrest.   - Inter-disciplinary family meet or Palliative Care meeting due by:  2/16   Paula Compton, MD Family Medicine - PGY 2  04/22/2015 8:40 AM

## 2015-04-22 NOTE — Care Management Note (Addendum)
Case Management Note  Patient Details  Name: ASHLEY HOBER MRN: [id number] Date of Birth: 1930/10/17  Subjective/Objective:  Pt admitted with acute/chronic resp distress  -  Placed on BIPAP in ED and then transferred to ICU and subsequently intubated            Action/Plan:  Pt is from [facility], son Legrand Como in [place].  CM consulted CSW for discharge planning.   Expected Discharge Date:                  Expected Discharge Plan:  Skilled Nursing Facility  In-House Referral:  Clinical Social Work  Discharge planning Services  CM Consult  Post Acute Care Choice:    Choice offered to:     DME Arranged:    DME Agency:     HH Arranged:    [place] Agency:     Status of Service:  In process, will continue to follow  Medicare Important Message Given:    Date Medicare IM Given:    Medicare IM give by:    Date Additional Medicare IM Given:    Additional Medicare Important Message give by:     If discussed at [place] of Stay Meetings, dates discussed:    Additional Comments:  Maryclare Labrador, RN 04/22/2015, 9:15 AM

## 2015-04-22 NOTE — Progress Notes (Signed)
eLink Physician-Brief Progress Note Patient Name: Sandra [name] DOB: [birth year] MRN: [id number]   Date of Service  04/22/2015  HPI/Events of Note  RN reports bradycardia on tele. EKG shows bifasicular block. BP stable.  eICU Interventions  Continue to monitor on telemetry.     Intervention Category Intermediate Interventions: Arrhythmia - evaluation and management  Tera Partridge 04/22/2015, 6:59 AM

## 2015-04-22 NOTE — Care Management (Signed)
CM contacted by pts daughter Sandra [name] :  Daughter is trying to gain information regarding mom however nursing staff has informed daughter multiple times that pt has password and it is required before information can be shared.  CM reiterated this to daughter and acknowledged her concerns, CM informed daughter that she would pass contact information [phone] to bedside nurse.   Pt's son Sandra [name] is listed as POA; bedside nurse attempting to locate POA form and will follow up with son/daughter when able.

## 2015-04-22 NOTE — Progress Notes (Signed)
Wasted 169mcg Fentanyl with Donald Siva RN.

## 2015-04-22 NOTE — Consult Note (Signed)
WOC wound consult note Reason for Consult: Consult requested for left leg wound.   Wound type: Left calf with several patchy areas of full thickness stasis ulcers spread across anterior calf; affected area 8X6X.1cm.  Another location to upper anterior calf with full thickness wound; removed old clotted blood and wound is 1.5X1.5X.1cm.  All locations are 100% red, with small amt bloody drainage after cleaning and removing old dried blood. Dressing procedure/placement/frequency: Aquacel to absorb drainage, decrease bleeding, and provide antimicrobial benefits.  Cover with ABD pad and kerlex. No family present at bedside to discuss plan of care and pt is not very responsive. Please re-consult if further assistance is needed.  Thank-you,  Julien Girt MSN, [place], [place], [place], [place]

## 2015-04-22 NOTE — Progress Notes (Signed)
UR Completed for ICU. Elenor Quinones, RN, BSN.  [phone]

## 2015-04-22 NOTE — Progress Notes (Signed)
Echocardiogram 2D Echocardiogram has been performed.  Sandra [name] 04/22/2015, 4:54 PM

## 2015-04-23 DIAGNOSIS — J9622 Acute and chronic respiratory failure with hypercapnia: Secondary | ICD-10-CM

## 2015-04-23 DIAGNOSIS — N179 Acute kidney failure, unspecified: Secondary | ICD-10-CM

## 2015-04-23 DIAGNOSIS — I480 Paroxysmal atrial fibrillation: Secondary | ICD-10-CM

## 2015-04-23 DIAGNOSIS — N183 Chronic kidney disease, stage 3 (moderate): Secondary | ICD-10-CM

## 2015-04-23 LAB — CBC
HCT: 28.5 % — ABNORMAL LOW (ref 36.0–46.0)
HEMOGLOBIN: 9.5 g/dL — AB (ref 12.0–15.0)
MCH: 29 pg (ref 26.0–34.0)
MCHC: 33.3 g/dL (ref 30.0–36.0)
MCV: 86.9 fL (ref 78.0–100.0)
PLATELETS: 102 10*3/uL — AB (ref 150–400)
RBC: 3.28 MIL/uL — AB (ref 3.87–5.11)
RDW: 15.9 % — ABNORMAL HIGH (ref 11.5–15.5)
WBC: 6.3 10*3/uL (ref 4.0–10.5)

## 2015-04-23 LAB — BASIC METABOLIC PANEL
Anion gap: 16 — ABNORMAL HIGH (ref 5–15)
BUN: 90 mg/dL — ABNORMAL HIGH (ref 6–20)
CHLORIDE: 92 mmol/L — AB (ref 101–111)
CO2: 29 mmol/L (ref 22–32)
Calcium: 8.4 mg/dL — ABNORMAL LOW (ref 8.9–10.3)
Creatinine, Ser: 2.83 mg/dL — ABNORMAL HIGH (ref 0.44–1.00)
GFR, EST AFRICAN AMERICAN: 17 mL/min — AB (ref >60)
GFR, EST NON AFRICAN AMERICAN: 15 mL/min — AB (ref >60)
Glucose, Bld: 101 mg/dL — ABNORMAL HIGH (ref 65–99)
Potassium: 3.5 mmol/L (ref 3.5–5.1)
SODIUM: 137 mmol/L (ref 135–145)

## 2015-04-23 LAB — GLUCOSE, CAPILLARY
GLUCOSE-CAPILLARY: 74 mg/dL (ref 65–99)
GLUCOSE-CAPILLARY: 86 mg/dL (ref 65–99)
Glucose-Capillary: 102 mg/dL — ABNORMAL HIGH (ref 65–99)
Glucose-Capillary: 108 mg/dL — ABNORMAL HIGH (ref 65–99)

## 2015-04-23 LAB — PROCALCITONIN: PROCALCITONIN: 0.31 ng/mL

## 2015-04-23 LAB — TROPONIN I: TROPONIN I: 0.08 ng/mL — AB (ref <0.031)

## 2015-04-23 MED ORDER — POTASSIUM CHLORIDE 20 MEQ/15ML (10%) PO SOLN
40.0000 meq | Freq: Once | ORAL | Status: AC
  Administered 2015-04-23: 40 meq
  Filled 2015-04-23: qty 30

## 2015-04-23 MED ORDER — POTASSIUM CHLORIDE 20 MEQ/15ML (10%) PO SOLN
40.0000 meq | Freq: Once | ORAL | Status: DC
  Filled 2015-04-23 (×2): qty 30

## 2015-04-23 MED ORDER — FUROSEMIDE 10 MG/ML IJ SOLN
40.0000 mg | Freq: Four times a day (QID) | INTRAMUSCULAR | Status: DC
  Administered 2015-04-23: 40 mg via INTRAVENOUS
  Filled 2015-04-23: qty 4

## 2015-04-23 MED ORDER — MORPHINE SULFATE 25 MG/ML IV SOLN
10.0000 mg/h | INTRAVENOUS | Status: DC
  Administered 2015-04-23: 10 mg/h via INTRAVENOUS
  Filled 2015-04-23: qty 10

## 2015-04-23 MED ORDER — MORPHINE BOLUS VIA INFUSION
5.0000 mg | INTRAVENOUS | Status: DC | PRN
  Filled 2015-04-23: qty 20

## 2015-04-23 MED ORDER — LORAZEPAM BOLUS VIA INFUSION
2.0000 mg | INTRAVENOUS | Status: DC | PRN
  Filled 2015-04-23: qty 5

## 2015-04-23 MED ORDER — POLYETHYLENE GLYCOL 3350 17 G PO PACK
17.0000 g | PACK | Freq: Every day | ORAL | Status: DC
  Administered 2015-04-23: 17 g via ORAL
  Filled 2015-04-23: qty 1

## 2015-04-23 MED ORDER — DEXTROSE 5 % IV SOLN
5.0000 mg/h | INTRAVENOUS | Status: DC
  Administered 2015-04-23: 5 mg/h via INTRAVENOUS
  Filled 2015-04-23 (×2): qty 25

## 2015-04-24 LAB — URINE CULTURE

## 2015-04-27 ENCOUNTER — Telehealth: Payer: Self-pay

## 2015-04-27 NOTE — Telephone Encounter (Signed)
On 04/27/2015 I received a death certificate from [employer] (Original). The death certificate is for burial. The patient is a patient of Doctor [name]. The death certificate will be taken to [facility] (2100) for signature. On 05/16/2015 I receive the death certificate back from Doctor [name]. I got the death certificate ready and called the funeral home to let them know the death certificate is ready for pickup.

## 2015-05-11 NOTE — Progress Notes (Signed)
Spoke with Charline Bills (pts son and POA) who decided to switch the pt to comfort care. Attempted to notify the pts other son Sharee Pimple of this decision, I was not able to contact him. I attempted his home and cell phone multiple times. Will attempt to notify him throughout the day.

## 2015-05-11 NOTE — Progress Notes (Signed)
Extensive discussion with family son med poA . We discussed the poor prognosis and likely poor quality of life. Family has decided to offer full comfort care. They are aware that the patient may be transferred to palliative care floor for continued comfort care needs. They have been fully updated on the process and expectations. He wants comfort now, he will attempt to fly in if able. Will cal lother local brother. And provide comfort today  [name]. [name], MD, [place] Pgr: [place] [facility]

## 2015-05-11 NOTE — Procedures (Signed)
Extubation Procedure Note  Patient Details:   Name: Sandra [name] DOB: 02-26-1931 MRN: [id number]   Airway Documentation:     Evaluation  O2 sats: transiently fell during during procedure Complications: No apparent complications Patient did tolerate procedure well. Bilateral Breath Sounds: Clear, Diminished, Rhonchi Suctioning: Airway No   Patient extubated per "withdrawal of life" protocol, per POA son request.  No complications noted during extubation.   [name] 05-04-15, 12:58 PM

## 2015-05-11 NOTE — Discharge Summary (Signed)
NAMEMarland Kitchen  STORMY, MEADOWS                  ACCOUNT NO.:  [id number]  MEDICAL RECORD NO.:  [id number]  LOCATION:  2M09C                        FACILITY:  [facility]  PHYSICIAN:  Raylene Miyamoto, MD DATE OF BIRTH:  [birth year]  DATE OF ADMISSION:  05/05/2015 DATE OF DISCHARGE:  2015-05-16                              DISCHARGE SUMMARY   DEATH SUMMARY:  This is a very unfortunate 82 year old female, who resides at the [facility], with a past medical history of multiple medical problems, including CAD, peripheral vascular disease, hypertension, she has had cancer in the past, iron deficient anemia, hemorrhoids, nosebleeds, depression, hypothyroidism, right bundle branch block, chronic kidney disease, including also diabetes, hypertension, coronary artery disease, home oxygen dependence 3 L, chronic respiratory failure, presumed OSA of chest with a stroke in the past, and a right BKA, with limited functional activity, who presented to the hospital with acute respiratory failure required intubation after failing BiPAP, was overloaded with fluids was treated aggressively with diuretics, had a horrible left lower leg ulcerative area, nonhealing, unfortunately as well as some concerns of cellulitis.  This patient was not surviving what so ever.  She had a head CT, which was negative.  Renal ultrasound, which was negative for hydro.  Echo which showed diastolic dysfunction with EF of 65%.  Urine grew Pseudomonas, came with a urinary tract infection on admission.  This patient was in pain, suffering on a machine was not going to benefit one moment from continued to be on mechanical support was not improve her functional quality of life.  I called up the son in [place] and who communicated with e son here at [facility], who just clearly did not want to see this patient is suffering longer with her with her bedridden status and now on mechanical ventilation.  They opted for comfort care  appropriately and the patient was provided comfort care and expired.  FINAL DIAGNOSIS:  Upon death; 1. Urinary tract infection leading to acute respiratory failure. 2. Acute respiratory failure, acute on chronic, likely pulmonary edema     related to effusions and overload. 3. Acute kidney injury. 4. Cellulitis and ulcerative lesions on the left lower extremity. 5. Status post amputation of the past and bedridden status. 6. Gross volume overload. 7. Encephalopathy multifactor.     Raylene Miyamoto, MD     DJF/MEDQ  D:  05/09/2015  T:  05/09/2015  Job:  [id number]

## 2015-05-11 NOTE — Progress Notes (Signed)
Pt noted to be asystole, no palpable pulses, and no respiratory rate. Expiration verified for 2 minutes by Ellard Artis, RN and Desmond Dike, Therapist, sports. Dr. [name] notified of pts death. Pts son Lewis Shock notified of death. Attempted to notify pts other son Sharee Pimple without success.   The pt had belongings at the bedside (4 rings). I have sent them to security to be placed in the safe. I have informed Lewis Shock that he will need to retrieve these from security.  200 ml of morphine wasted down sink and 40 ml of ativan wasted down sink. Witnessed by [employer], RN and Desmond Dike, Therapist, sports.

## 2015-05-11 NOTE — Progress Notes (Signed)
PULMONARY / CRITICAL CARE MEDICINE   Name: RENATE KLIMEK MRN: [id number] DOB: November 11, 1930    ADMISSION DATE:  04/14/2015 CONSULTATION DATE:  2/9  REFERRING MD:  Dr. Waldron Labs TRH  CHIEF COMPLAINT:  AMS  HISTORY OF PRESENT ILLNESS:   82 year old female who resides in [facility] SN[facility], with PMH as below, which includes Chronic respiratory failure on 3L home O2, CAD, HTN, DM, Colon cancer s/p colectomy, CVA, and R BKA. She presumably has a very limited functional capacity at baseline. resp failure, ett, failure to thrive.   PAST MEDICAL HISTORY :  She  has a past medical history of Anemia; CAD (coronary artery disease); Peripheral vascular disease ([place]); Hypertension; Cancer ([place]); Anemia, iron deficiency (01/17/2012); Hemorrhoids; Nosebleed; Stroke [facility]); Cellulitis (03/08/12); Diabetes mellitus; Hypothyroidism; Shortness of breath dyspnea; Depression; Wound infection ([place]); Diabetic foot ulcers ([place]); RBBB (right bundle branch block with left anterior fascicular block); Chronic kidney disease; and Edema of both legs.  SUBJECTIVE: brady   VITAL SIGNS: BP 145/131 mmHg  Pulse 51  Temp(Src) 97.7 F (36.5 C) (Oral)  Resp 18  Wt 102.1 kg (225 lb 1.4 oz)  SpO2 100%  HEMODYNAMICS: CVP:  [13 mmHg-20 mmHg] 13 mmHg  VENTILATOR SETTINGS: Vent Mode:  [-] PRVC FiO2 (%):  [40 %] 40 % Set Rate:  [22 bmp] 22 bmp Vt Set:  [430 mL] 430 mL PEEP:  [5 cmH20] 5 cmH20 Plateau Pressure:  [24 [id number] cmH20] 28 cmH20  INTAKE / OUTPUT: I/O last 3 completed shifts: In: 2783.2 [I.V.:2083.2; Other:30; NG/GT:470; IV [id number] Out: [id number] [Urine:515; Emesis/NG output:1290]  PHYSICAL EXAMINATION: General:  Morbidly obese, chronically ill appearing female Neuro:  Obtunded, sedation fent, rass -2, does open eyes HEENT:  perr sluggish, ett  Cardiovascular:  RRR, no MRG Lungs:  Distant  Abdomen:  Obese, tight skin but not firm, non-distended Musculoskeletal:  R AKA with fluid filled blister on incision  line Skin:  LLE redness and wounds.          LABS:  BMET  Recent Labs Lab 04/26/2015 1912 04/22/15 0450 04/27/2015 0423  NA 132* 134* 137  K 4.9 3.8 3.5  CL 91* 93* 92*  CO2 27 30 29   BUN 96* 95* 90*  CREATININE 2.83* 2.88* 2.83*  GLUCOSE 87 63* 101*    Electrolytes  Recent Labs Lab 04/29/2015 1912 04/22/15 0450 2015-04-27 0423  CALCIUM 8.6* 8.4* 8.4*    CBC  Recent Labs Lab 04/18/15 04/24/2015 1023 27-Apr-2015 0423  WBC 6.6 6.3 6.3  HGB 9.0* 8.7* 9.5*  HCT 29* 27.4* 28.5*  PLT 139* 121* 102*    Coag's  Recent Labs Lab 05/04/2015 1023  INR 1.22    Sepsis Markers  Recent Labs Lab 05/03/2015 1039 05/05/2015 1911 04/22/15 0143 04/22/15 0450 04-27-2015 0423  LATICACIDVEN 0.77  --  1.1  --   --   PROCALCITON  --  0.34  --  0.41 0.31    ABG  Recent Labs Lab 05/01/2015 1332 04/17/2015 1656 04/13/2015 2253  PHART 7.215* 7.287* 7.497*  PCO2ART 83.7* 72.9* 39.9  PO2ART 96.0 105.0* 98.0    Liver Enzymes  Recent Labs Lab 04/18/15 04/25/2015 1023 05/02/2015 1912  AST 15 16  --   ALT 5* 10*  --   ALKPHOS 90 81  --   BILITOT  --  1.1  --   ALBUMIN  --  3.1* 2.8*    Cardiac Enzymes  Recent Labs Lab 04/22/15 1230 04/22/15 1758 04/22/15 2340  TROPONINI 0.08* 0.08* 0.08*  Glucose  Recent Labs Lab 04/22/15 0857 04/22/15 1147 04/22/15 1538 04/22/15 1817 04/22/15 2342 05-12-2015 0348  GLUCAP 101* 78 84 79 74 86    Imaging Ct Head Wo Contrast  04/22/2015  CLINICAL DATA:  82 year old female with altered mental status and encephalopathy. EXAM: CT HEAD WITHOUT CONTRAST TECHNIQUE: Contiguous axial images were obtained from the base of the skull through the vertex without intravenous contrast. COMPARISON:  None. FINDINGS: Moderate atrophy and probable chronic small-vessel white matter ischemic changes identified. No acute intracranial abnormalities are identified, including mass lesion or mass effect, hydrocephalus, extra-axial fluid collection, midline  shift, hemorrhage, or acute infarction. The visualized bony calvarium is unremarkable. Nasal/ oral intubation identified. IMPRESSION: No evidence of intra discretion no evidence of acute intracranial abnormality. Atrophy and probable chronic small-vessel white matter ischemic changes. Electronically Signed   By: Margarette Canada M.D.   On: 04/22/2015 15:00   US Renal  04/22/2015  CLINICAL DATA:  UTI EXAM: RENAL / URINARY TRACT ULTRASOUND COMPLETE COMPARISON:  07/09/2014 FINDINGS: Right Kidney: Length: 9.3 cm. Increased echogenicity is noted consistent with medical renal disease. A few small cysts are noted. The largest of these is a 1 cm cyst in the upper pole. Left Kidney: Length: 11.1 cm. Increased echogenicity consistent with medical renal disease. Bladder: Decompressed by Foley catheter. IMPRESSION: Right renal cysts. Changes consistent with medical renal disease. Electronically Signed   By: Inez Catalina M.D.   On: 04/22/2015 16:26    STUDIES:  Echo 2/10>>>65%, dd 2, pa 52 2/10 ct head>>>neg acute 2/10 renal US>>cysts, neg hydro  CULTURES: Blood 2/10 > unable to draw.  Urine 2/9 >>> pseudomonas a  ANTIBIOTICS: Zosyn 2/9 >>> Vancomycin 2/9 >>>2/11  SIGNIFICANT EVENTS: 2/9 admit  LINES/TUBES: ETT 2/9 >> CVL 2/9 >>  DISCUSSION: 82 year old female with multiple medical co-morbidities presented to [facility] ED 2/9 with 1 week of progressive dyspnea.  ASSESSMENT / PLAN:  PULMONARY A:  Acute on chronic  Hypoxic, hypercarbic respiratory failure r/t  ? Pulmonary edema effusions  P:   ABG last reviewed, reduce rate 16 Neg balance is goal Wean SBT , cpap 5 ps 5, goal 2 hr Continued vent is futile, will d/w family, consider comfort May need [place] rt base  CARDIOVASCULAR A:  CAD, HTN Inaccurate BP cuff P:  Continue home meds once the n/g tube in place Lasix (increase)  as needed for volume removal, ASA, hydralazine,   RENAL A:   AKI potentially due to infection  Hyperkalemia -  resolved Azotemia / Uremia Gross overload P:   [place] reviewed Lasix increase Chem in am  kvo Goal neg 2 liters Not a candidate HD.    GASTROINTESTINAL A:   H/o Colon cancer s/p colectomy P:   TF at goal Protonix for SUP Need BM  HEMATOLOGIC A:   Anaemia of chronic disease, hemorrhoids , dilution  P:  Trend CBC Transfuse per ICU guidelines Heparin for VTE prophylaxis - follow plat lasix  INFECTIOUS A:   Urinary tract infection  Open, unhealing wounds on left leg  P:   Dc vanc Maintain zosyn, follow sens pseudomonas  ENDOCRINE A:   DM,  Hypothyroidism P:   POC testing Q4 hours SSI coverage Synthroid  NEUROLOGIC A:   Acute encephlopathy r/t ? Metabolic encephalopathy r/ t hypercarbia,? Infection,? Hepatic encephalopathy, Depression Uremic Encephalopathy.  H/o CVA  P:   Lactulose per tube.   RASS goal: 0 Dc versed, high risk delirium    CT head neg  FAMILY  - Updates: Son Sherrye Payor spoken to via telephone by [facility] Wishes for short term intubation. Cautioned that the would likely be difficult to wean from vent. He says that if there is a chance she could return to SNF she would want that. In that same vein, she would not wasn't CPR, defib in the event of cardiac arrest.   - Inter-disciplinary family meet or Palliative Care meeting due by:  2/16  Calling son today, to consider comfort  Ccm time 30 min  [name]. [name], MD, [place] Pgr: [facility]

## 2015-05-11 DEATH — deceased

## 2015-08-20 ENCOUNTER — Other Ambulatory Visit: Payer: Self-pay | Admitting: Nurse Practitioner

## 2016-04-09 IMAGING — DX DG CHEST 1V PORT
1 series · 1 of 1 positions shown · non-contrast
Comparison: 07/07/2014

CLINICAL DATA: SOB,HX HTN,STROKE,DM,CKD.''''COULD NOT GET PT FLAT
FOR X-RAY'''

EXAM:
PORTABLE CHEST 1 VIEW

[chest ap]
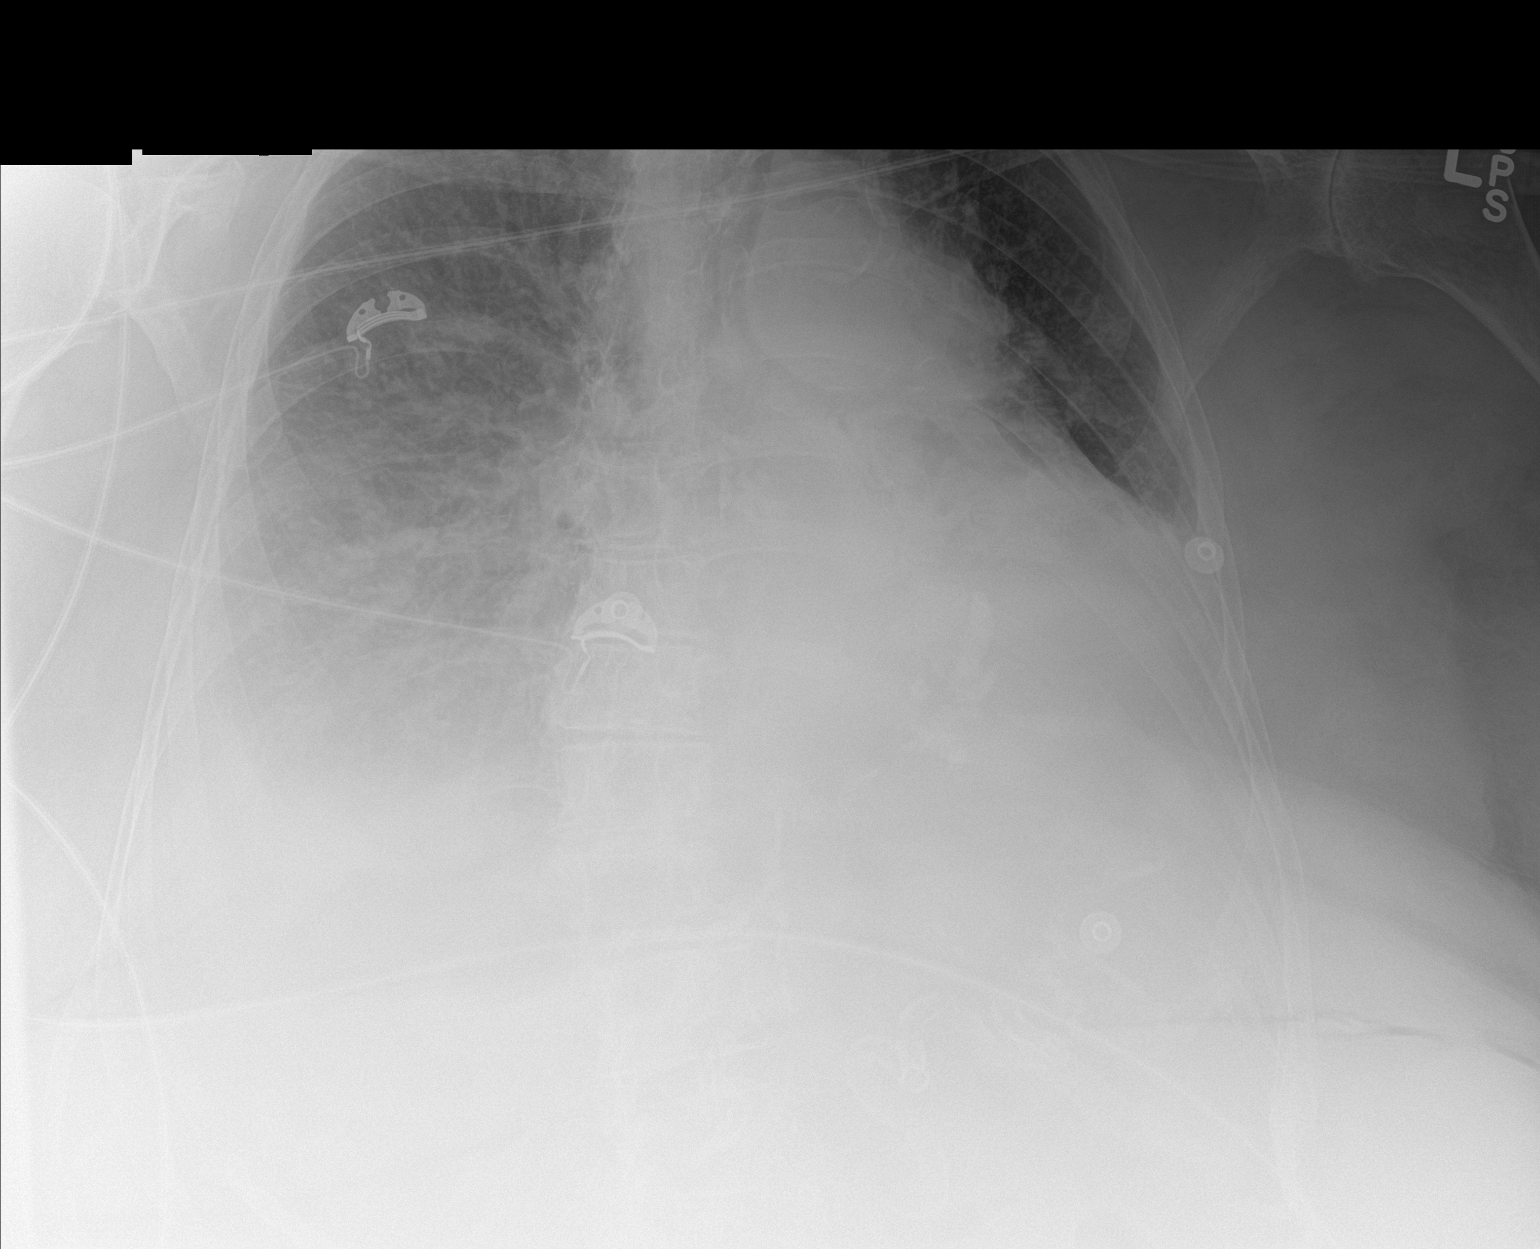

[1 of 1 positions shown; findings below may reference images not displayed]

FINDINGS: Mildly degraded exam due to AP portable technique and patient body
habitus. Patient rotated left. Cardiomegaly accentuated by AP
portable technique. Probable layering bilateral pleural effusions,
similar. No pneumothorax. Interstitial edema is moderate and
progressive. Lower lobe predominant airspace opacities are similar
on the left and worse on the right.
IMPRESSION: Overall worsened aeration with new or increased congestive heart
failure and similar layering bilateral pleural effusions.

Similar left and worsened right base airspace disease which could
represent atelectasis concurrent infection.

## 2016-04-10 IMAGING — CT CT HEAD W/O CM
3 series · 16 of 30 positions shown, 18 images · non-contrast
Comparison: None.

CLINICAL DATA: 84-year-old female with altered mental status and
encephalopathy.

EXAM:
CT HEAD WITHOUT CONTRAST
TECHNIQUE: Contiguous axial images were obtained from the base of the skull
through the vertex without intravenous contrast.

[Series 2: head without · axial · non-contrast · 0.43mm/px · z∈[-132,-27]mm · 4 of 35 slices shown]
[im 7/35  brain]
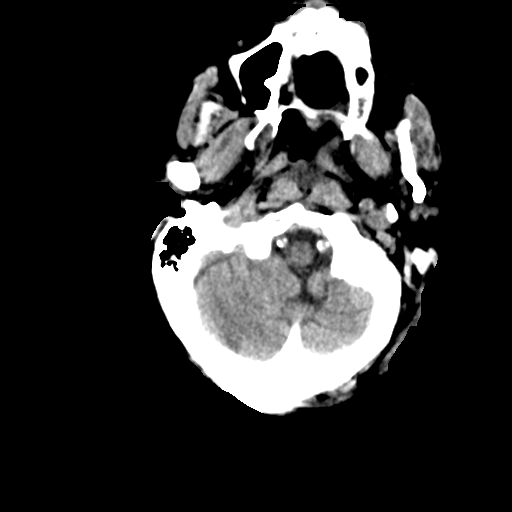
[im 14/35  brain]
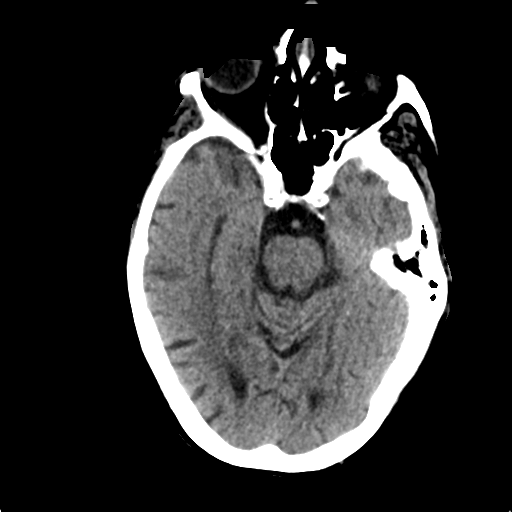
[im 21/35  brain]
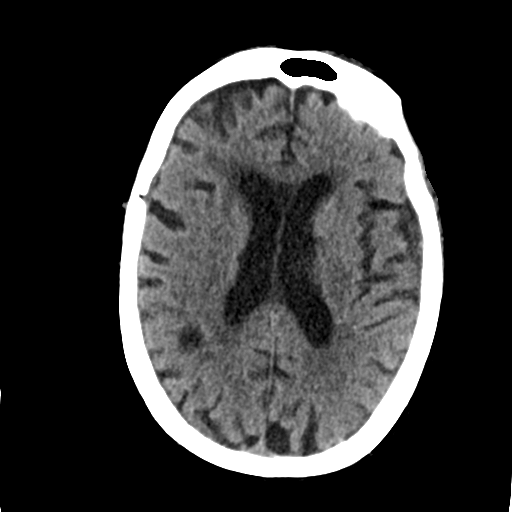
[im 28/35  brain]
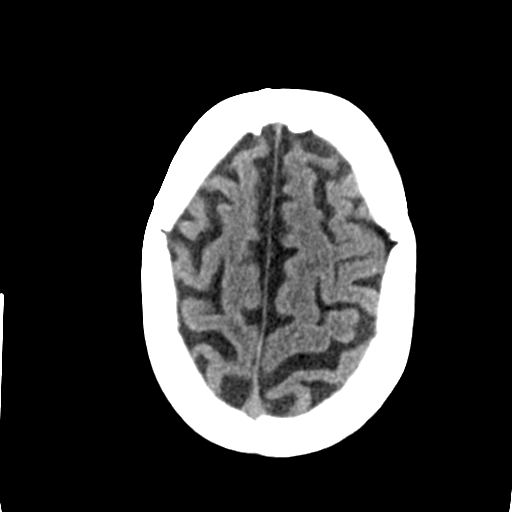

[Series 3: head bone · axial · 0.43mm/px · z∈[-152,-22]mm · 7 of 88 slices shown]
[im 6/88  bone]
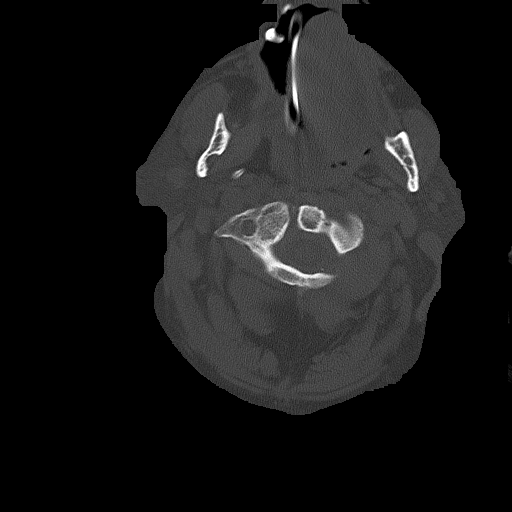
[im 17/88  bone]
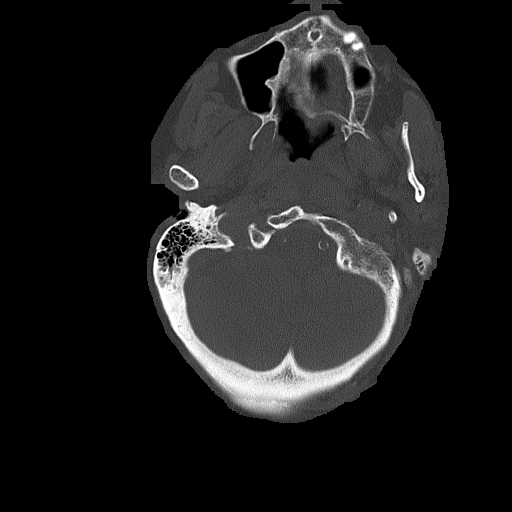
[im 28/88  bone]
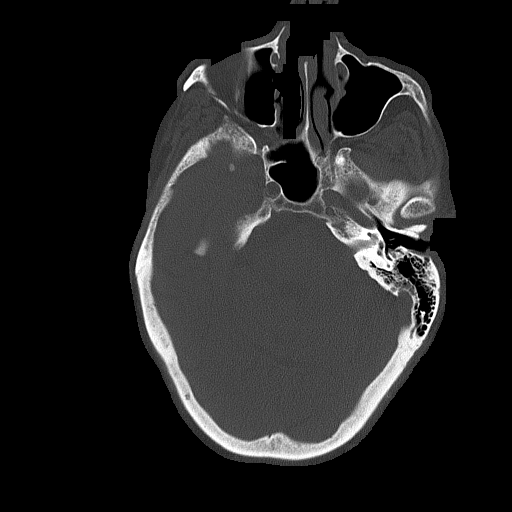
[im 39/88  bone]
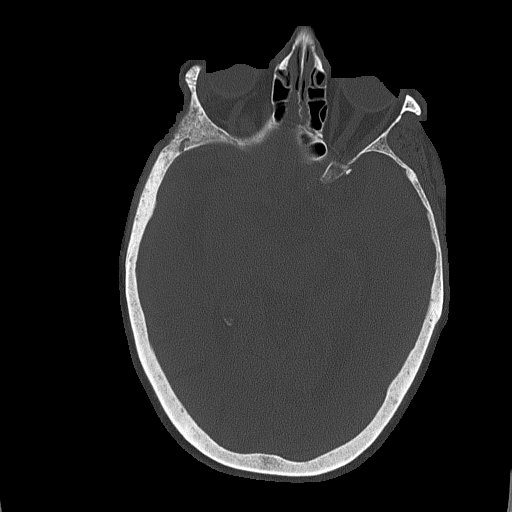
[im 49/88  bone]
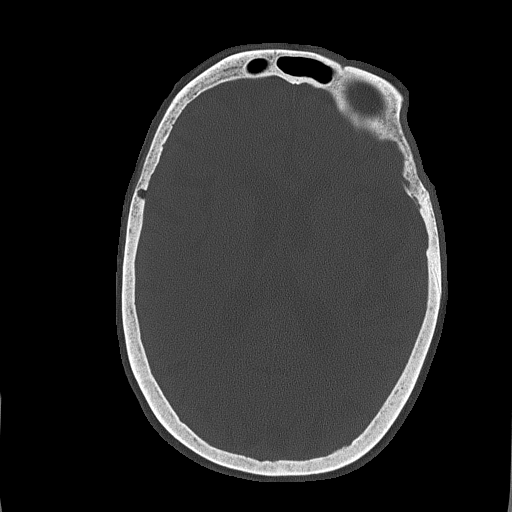
[im 60/88  bone]
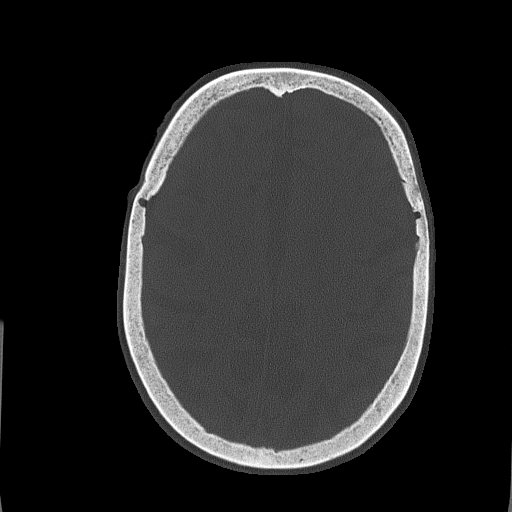
[im 71/88  bone]
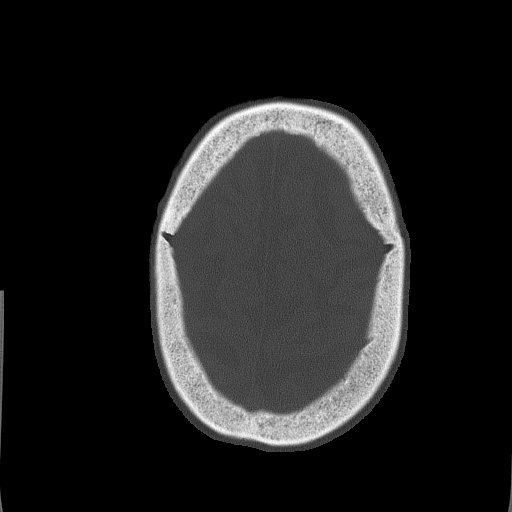

[Series 4: head without ax · axial · non-contrast · 0.32mm/px · z∈[-145,-33]mm · 5 of 35 slices shown, 7 images]
[im 6/35  brain]
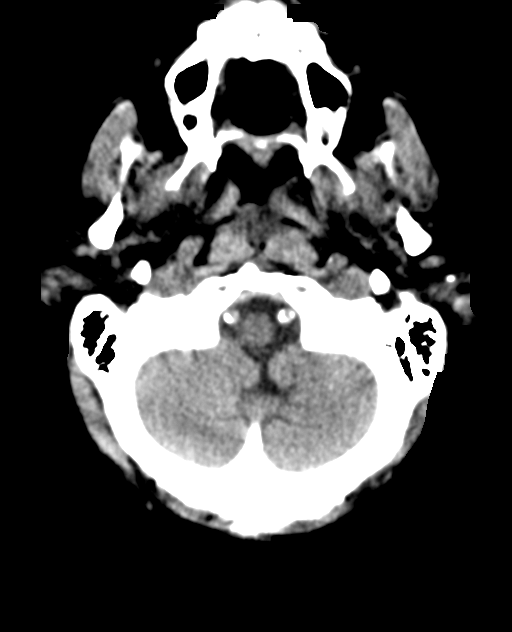
[im 6/35  bone]
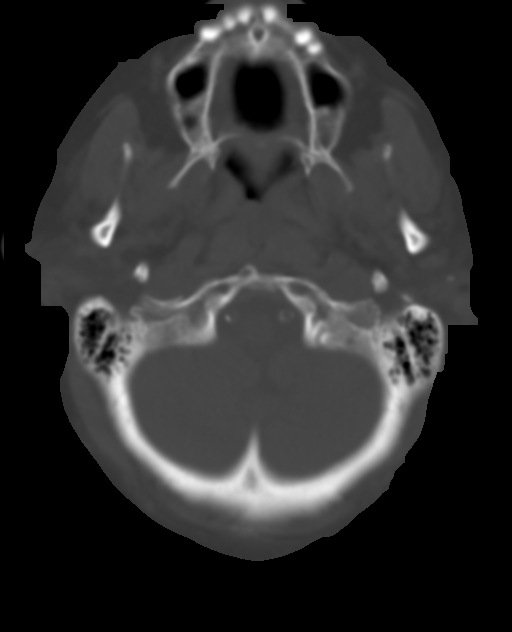
[im 12/35  brain]
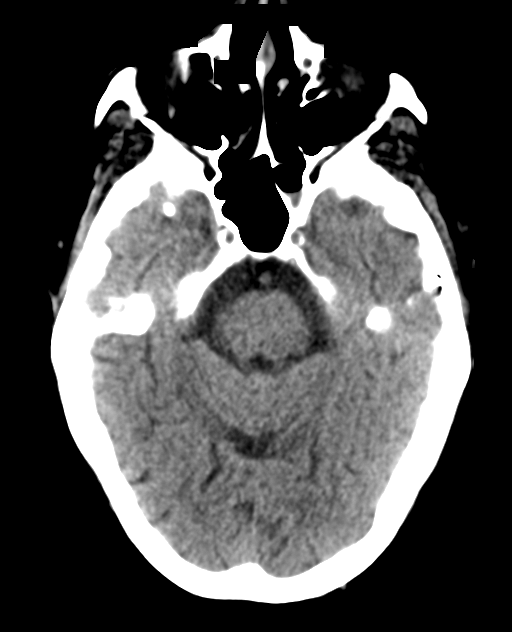
[im 18/35  brain]
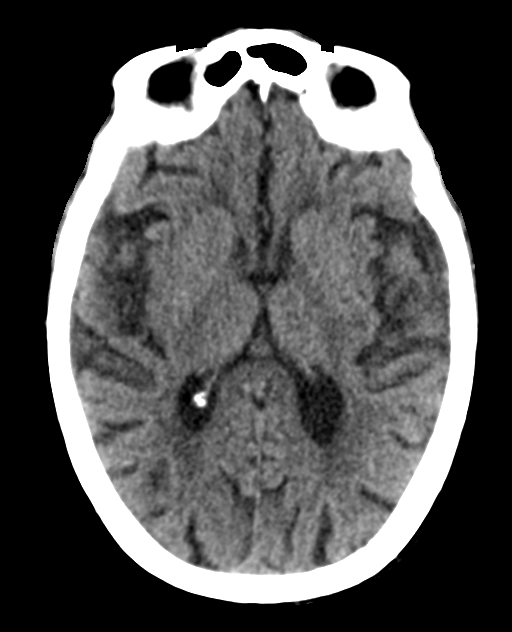
[im 23/35  brain]
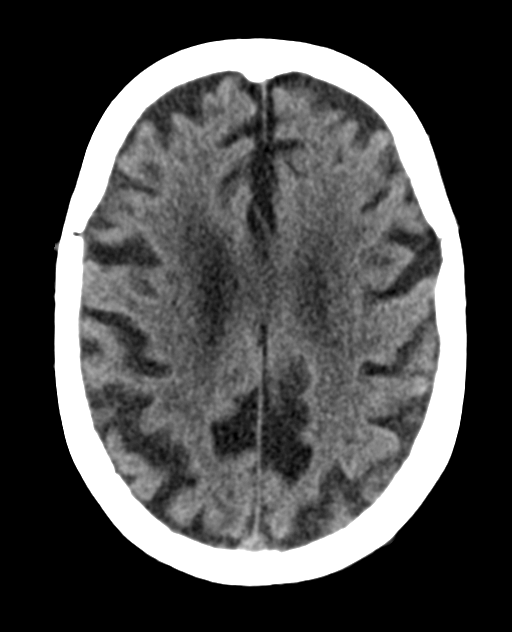
[im 29/35  brain]
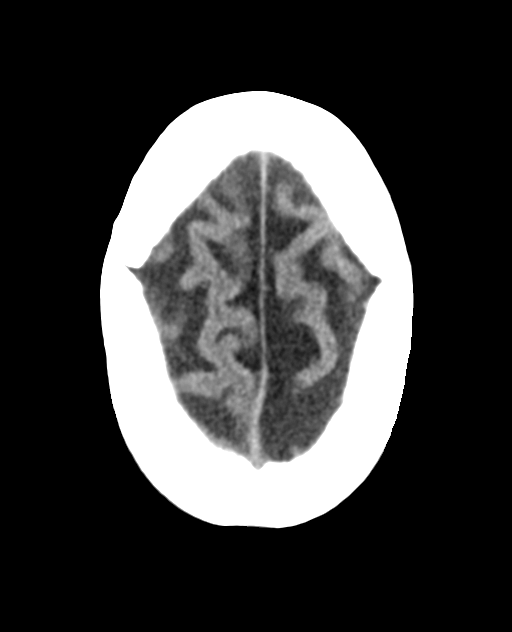
[im 29/35  bone]
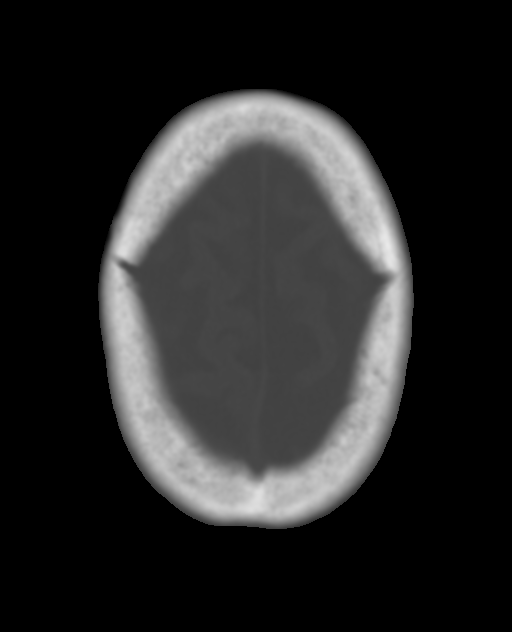

[16 of 30 positions shown; findings below may reference images not displayed]

FINDINGS: Moderate atrophy and probable chronic small-vessel white matter
ischemic changes identified.

No acute intracranial abnormalities are identified, including mass
lesion or mass effect, hydrocephalus, extra-axial fluid collection,
midline shift, hemorrhage, or acute infarction.

The visualized bony calvarium is unremarkable.

Nasal/ oral intubation identified.
IMPRESSION: No evidence of intra discretion no evidence of acute intracranial
abnormality.

Atrophy and probable chronic small-vessel white matter ischemic
changes.

## 2016-04-10 IMAGING — CR DG CHEST 1V PORT
1 series · 1 of 1 positions shown · non-contrast
Comparison: Chest radiograph performed 04/21/2015

CLINICAL DATA: Central line placement.  Initial encounter.

EXAM:
PORTABLE CHEST 1 VIEW

[AP]
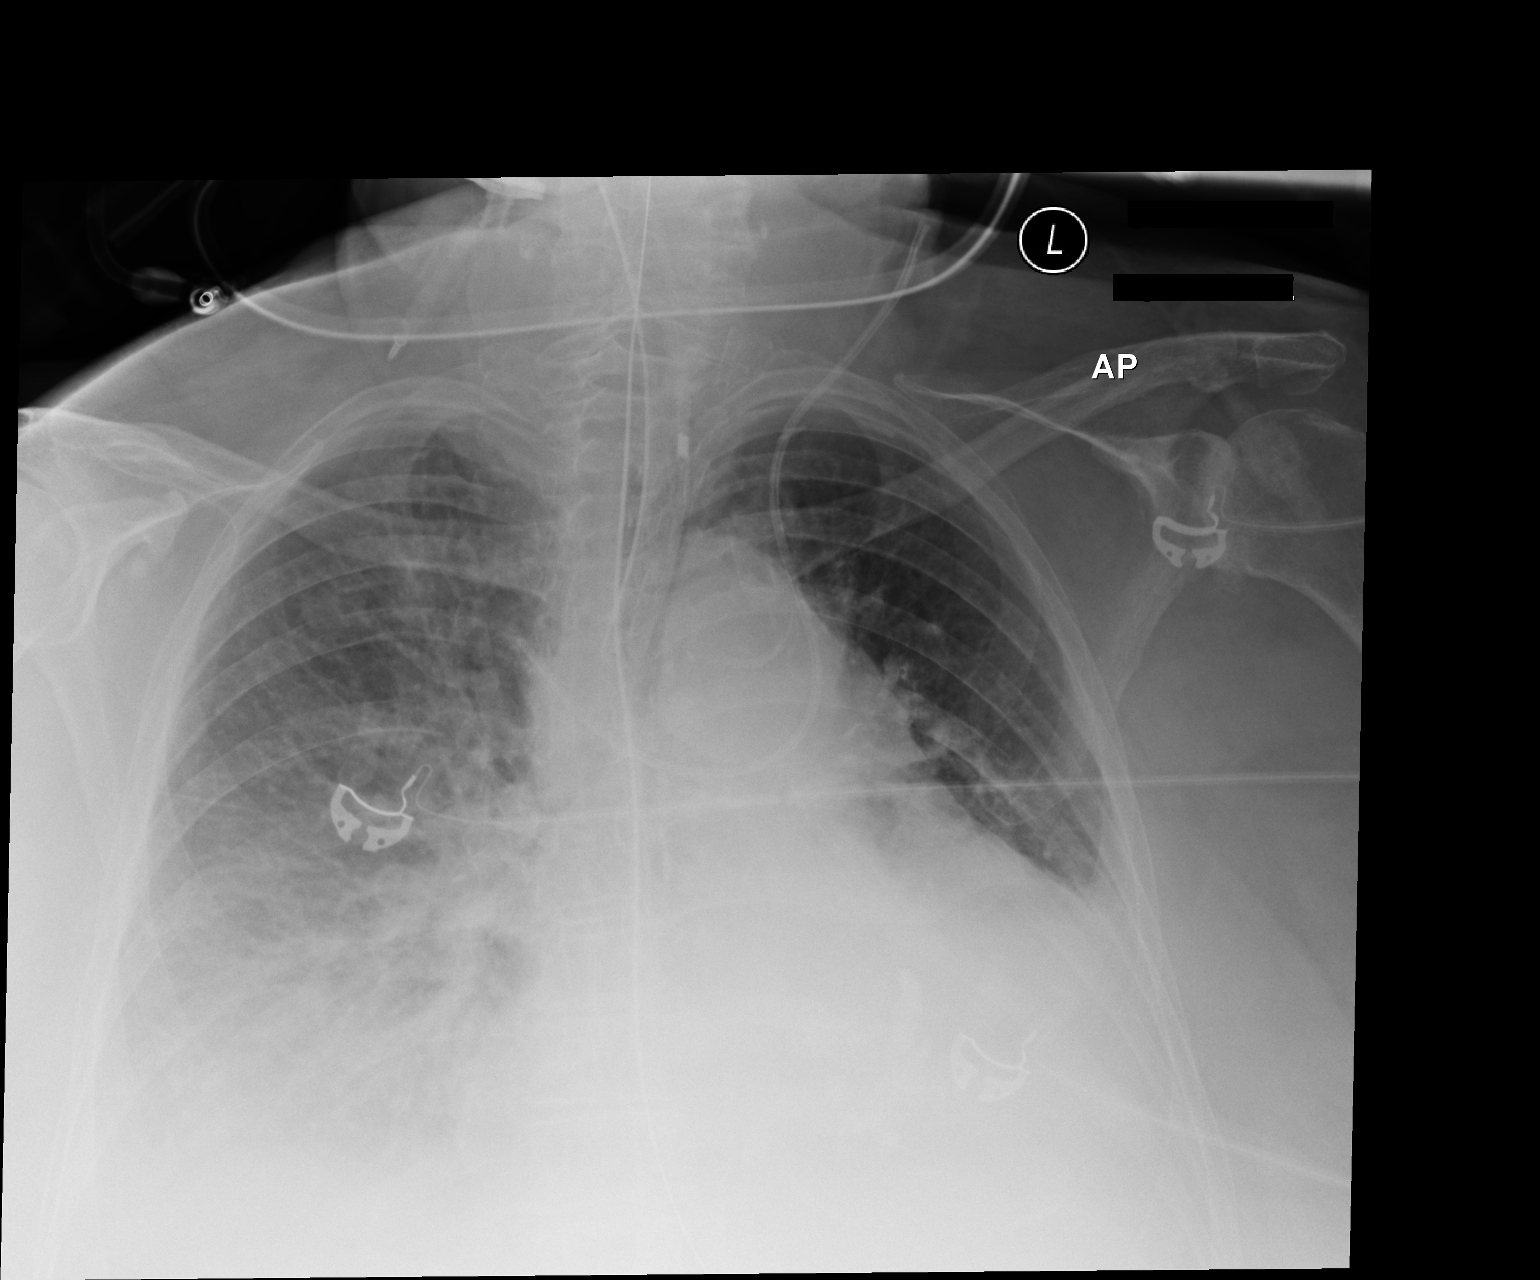

[1 of 1 positions shown; findings below may reference images not displayed]

FINDINGS: The patient's endotracheal tube is seen ending 2 cm above the
carina. A left IJ line appears to extend across the midline
overlying the right brachiocephalic vein, ending near its junction
with the SVC.

Small bilateral pleural effusions are again noted. Bilateral central
and bibasilar airspace opacities raise concern for pulmonary edema.
No pneumothorax is seen.

The cardiomediastinal silhouette is enlarged. No acute osseous
abnormalities are identified.
IMPRESSION: 1. Left IJ line appears to extend across the midline overlying the
right brachiocephalic vein, ending near its junction with the SVC.
2. Endotracheal tube ends 2 cm above the carina.
3. Small bilateral pleural effusions noted. Bilateral central and
bibasilar airspace opacities raise concern for pulmonary edema.
Cardiomegaly.
